# Patient Record
Sex: Female | Born: 1960
Health system: Southern US, Community
[De-identification: ages and names within clinical notes are randomized; demographics above are authoritative.]

## PROBLEM LIST (undated history)

## (undated) DIAGNOSIS — I10 Essential (primary) hypertension: Secondary | ICD-10-CM

## (undated) DIAGNOSIS — E785 Hyperlipidemia, unspecified: Secondary | ICD-10-CM

## (undated) DIAGNOSIS — E039 Hypothyroidism, unspecified: Secondary | ICD-10-CM

## (undated) DIAGNOSIS — F99 Mental disorder, not otherwise specified: Secondary | ICD-10-CM

## (undated) DIAGNOSIS — M1990 Unspecified osteoarthritis, unspecified site: Secondary | ICD-10-CM

## (undated) DIAGNOSIS — Z96659 Presence of unspecified artificial knee joint: Secondary | ICD-10-CM

## (undated) DIAGNOSIS — E079 Disorder of thyroid, unspecified: Secondary | ICD-10-CM

## (undated) DIAGNOSIS — F32A Depression, unspecified: Secondary | ICD-10-CM

## (undated) DIAGNOSIS — E119 Type 2 diabetes mellitus without complications: Secondary | ICD-10-CM

## (undated) DIAGNOSIS — F329 Major depressive disorder, single episode, unspecified: Secondary | ICD-10-CM

## (undated) DIAGNOSIS — Z972 Presence of dental prosthetic device (complete) (partial): Secondary | ICD-10-CM

## (undated) DIAGNOSIS — F419 Anxiety disorder, unspecified: Secondary | ICD-10-CM

## (undated) HISTORY — PX: MULTIPLE TOOTH EXTRACTIONS: SHX2053

## (undated) HISTORY — DX: Essential (primary) hypertension: I10

## (undated) HISTORY — PX: JOINT REPLACEMENT: SHX530

## (undated) HISTORY — PX: HAND SURGERY: SHX662

## (undated) HISTORY — DX: Unspecified osteoarthritis, unspecified site: M19.90

## (undated) HISTORY — PX: CARPAL TUNNEL RELEASE: SHX101

## (undated) HISTORY — DX: Disorder of thyroid, unspecified: E07.9

## (undated) HISTORY — PX: TUBAL LIGATION: SHX77

## (undated) HISTORY — PX: BACK SURGERY: SHX140

## (undated) HISTORY — PX: TONSILLECTOMY: SUR1361

---

## 1998-06-27 ENCOUNTER — Ambulatory Visit (HOSPITAL_COMMUNITY): Admission: RE | Admit: 1998-06-27 | Discharge: 1998-06-27 | Payer: Self-pay | Admitting: Orthopedic Surgery

## 1998-06-27 ENCOUNTER — Encounter: Payer: Self-pay | Admitting: Orthopedic Surgery

## 1998-07-11 ENCOUNTER — Ambulatory Visit (HOSPITAL_COMMUNITY): Admission: RE | Admit: 1998-07-11 | Discharge: 1998-07-11 | Payer: Self-pay | Admitting: Orthopedic Surgery

## 1998-07-11 ENCOUNTER — Encounter: Payer: Self-pay | Admitting: Orthopedic Surgery

## 1998-07-25 ENCOUNTER — Ambulatory Visit (HOSPITAL_COMMUNITY): Admission: RE | Admit: 1998-07-25 | Discharge: 1998-07-25 | Payer: Self-pay | Admitting: Orthopedic Surgery

## 1999-02-21 ENCOUNTER — Ambulatory Visit (HOSPITAL_COMMUNITY): Admission: RE | Admit: 1999-02-21 | Discharge: 1999-02-21 | Payer: Self-pay | Admitting: Internal Medicine

## 1999-02-21 ENCOUNTER — Encounter: Payer: Self-pay | Admitting: Internal Medicine

## 1999-02-26 ENCOUNTER — Ambulatory Visit (HOSPITAL_COMMUNITY): Admission: RE | Admit: 1999-02-26 | Discharge: 1999-02-26 | Payer: Self-pay | Admitting: Internal Medicine

## 1999-02-26 ENCOUNTER — Encounter: Payer: Self-pay | Admitting: Internal Medicine

## 1999-03-28 ENCOUNTER — Encounter: Payer: Self-pay | Admitting: Orthopedic Surgery

## 1999-04-03 ENCOUNTER — Inpatient Hospital Stay (HOSPITAL_COMMUNITY): Admission: RE | Admit: 1999-04-03 | Discharge: 1999-04-07 | Payer: Self-pay | Admitting: Orthopedic Surgery

## 1999-04-03 ENCOUNTER — Encounter: Payer: Self-pay | Admitting: Orthopedic Surgery

## 2000-02-13 ENCOUNTER — Other Ambulatory Visit: Admission: RE | Admit: 2000-02-13 | Discharge: 2000-02-13 | Payer: Self-pay | Admitting: Internal Medicine

## 2001-09-28 ENCOUNTER — Encounter: Payer: Self-pay | Admitting: Orthopedic Surgery

## 2001-10-03 ENCOUNTER — Encounter: Payer: Self-pay | Admitting: Orthopedic Surgery

## 2001-10-03 ENCOUNTER — Inpatient Hospital Stay (HOSPITAL_COMMUNITY): Admission: RE | Admit: 2001-10-03 | Discharge: 2001-10-07 | Payer: Self-pay | Admitting: Orthopedic Surgery

## 2006-02-17 ENCOUNTER — Encounter: Payer: Self-pay | Admitting: Orthopedic Surgery

## 2007-01-18 ENCOUNTER — Encounter: Admission: RE | Admit: 2007-01-18 | Discharge: 2007-01-18 | Payer: Self-pay | Admitting: Orthopedic Surgery

## 2008-08-22 ENCOUNTER — Encounter (INDEPENDENT_AMBULATORY_CARE_PROVIDER_SITE_OTHER): Payer: Self-pay | Admitting: Otolaryngology

## 2008-08-22 ENCOUNTER — Ambulatory Visit (HOSPITAL_COMMUNITY): Admission: RE | Admit: 2008-08-22 | Discharge: 2008-08-23 | Payer: Self-pay | Admitting: Otolaryngology

## 2010-11-18 ENCOUNTER — Other Ambulatory Visit (HOSPITAL_COMMUNITY): Payer: Self-pay | Admitting: Orthopedic Surgery

## 2010-11-18 DIAGNOSIS — T84038A Mechanical loosening of other internal prosthetic joint, initial encounter: Secondary | ICD-10-CM

## 2010-11-28 ENCOUNTER — Other Ambulatory Visit (HOSPITAL_COMMUNITY): Payer: Self-pay | Admitting: Orthopedic Surgery

## 2010-11-28 ENCOUNTER — Encounter (HOSPITAL_COMMUNITY): Payer: Self-pay

## 2010-11-28 ENCOUNTER — Encounter (HOSPITAL_COMMUNITY)
Admission: RE | Admit: 2010-11-28 | Discharge: 2010-11-28 | Disposition: A | Discharge status: Home / Self Care | Payer: Medicare Other | Source: Ambulatory Visit | Attending: Orthopedic Surgery | Admitting: Orthopedic Surgery

## 2010-11-28 DIAGNOSIS — T84038A Mechanical loosening of other internal prosthetic joint, initial encounter: Secondary | ICD-10-CM

## 2010-11-28 DIAGNOSIS — R937 Abnormal findings on diagnostic imaging of other parts of musculoskeletal system: Secondary | ICD-10-CM | POA: Insufficient documentation

## 2010-11-28 DIAGNOSIS — Z96659 Presence of unspecified artificial knee joint: Secondary | ICD-10-CM | POA: Insufficient documentation

## 2010-11-28 DIAGNOSIS — W19XXXA Unspecified fall, initial encounter: Secondary | ICD-10-CM | POA: Insufficient documentation

## 2010-11-28 DIAGNOSIS — M25569 Pain in unspecified knee: Secondary | ICD-10-CM | POA: Insufficient documentation

## 2010-11-28 HISTORY — DX: Presence of unspecified artificial knee joint: Z96.659

## 2010-11-28 MED ORDER — TECHNETIUM TC 99M MEDRONATE IV KIT
23.1000 | PACK | Freq: Once | INTRAVENOUS | Status: AC | PRN
  Administered 2010-11-28: 23.1 via INTRAVENOUS

## 2011-03-06 NOTE — Procedures (Signed)
[place]. [facility]  Patient:    Margaret [name], Margaret [name] Visit Number: [id number] MRN: [id number]          Service Type: SUR Location: 5000 5028 01 Attending Physician:  Colbert Ewing Dictated by:   Kaylyn Layer Michelle Piper, M.D. Proc. Date: 10/03/01 Admit Date:  10/03/2001                             Procedure Report  PROCEDURE PERFORMED:  Epidural catheter placement for postoperative pain relief.  ANESTHESIOLOGIST:  Kaylyn Layer. Michelle Piper, M.D.  DESCRIPTION OF PROCEDURE:  I was consulted by Dr. Richardson Landry to place an epidural catheter into Ms. Petsch for postoperative pain relief.  I had the opportunity to talk to the patient preoperatively and explain the risks and benefits and she agreed to the catheter placement.  At the end of the procedure prior to emergency from anesthesia, the patient was turned to the right lateral decubitus position.  Her back was prepped with Betadine.  Using a #17 Tuohy needle the epidural space was easily cannulated at the L2-3 interspace using the loss of resistance technique.  The catheter was threaded 5 cm into the epidural space and the needle removed.  The catheter was then affixed to the patient back.  She was then turned supine. 100 mcg of fentanyl and 10 cc of 0.5% Xylocaine was incrementally injected after negative aspiration of the catheter.  She was then extubated and taken to the recovery room in stable condition.  In the post anesthesia care unit a fentanyl/Marcaine infusion was begun.  She was followed on the floor by Anesthesia. Dictated by:   Kaylyn Layer Michelle Piper, M.D. Attending Physician:  Colbert Ewing DD:  10/03/01 TD:  10/03/01 Job: [id number]

## 2011-03-06 NOTE — Discharge Summary (Signed)
[place]. [facility]  Patient:    Margaret [name], Margaret [name] Visit Number: [id number] MRN: [id number]          Service Type: SUR Location: 5000 5028 01 Attending Physician:  Colbert Ewing Dictated by:   Oris Drone Petrarca, P.A.-C. Admit Date:  10/03/2001 Discharge Date: 10/07/2001                             Discharge Summary  ADMISSION DIAGNOSIS:  Advanced degenerative joint disease left knee.  DISCHARGE DIAGNOSES: 1. Advanced degenerative joint disease left knee. 2. Cigarette smoker. 3. Exogenous obesity. 4. Hypothyroidism.  PROCEDURE:  Left total knee replacement.  HISTORY OF PRESENT ILLNESS:  The patient is a 50 year old white female with long-standing pain and swelling of her right knee.  She has arthroscopically documented grade 3 to 4 changes.  She has failed conservative treatment.  Now indicated for right total knee replacement.  HOSPITAL COURSE:  The patient is a 50 year old white female admitted October 03, 2001 after appropriate laboratory studies were obtained as well as a 1 g of Ancef IV.  The patient was taken to the operating room where underwent a right total knee replacement.  She tolerated the procedure well. An epidural was placed for postoperative pain management.  Heparin 5000 units subcutaneous q.12h. was placed until her Coumadin became therapeutic. Consultation for PT and OT were made.  Ambulating and weightbearing as tolerated on the right.  A Foley was placed intraoperatively.  She did have some difficulties with her CPM postoperatively, but this was managed by the ortho tech.  There as also difficulty with line placement, and unfortunately at the time, she had her epidural accidentally disconnected.  She was placed on Mepergan Fortes and Dilaudid for pain.  She actually otherwise had an unremarkable hospital course, and she was discharged on October 07, 2001 to return back to our office in ten days for staple  removal.  DIAGNOSTIC/LABORATORY STUDIES:  EKG showed normal sinus rhythm.  Chest x-ray showed no abnormality.  Right knee on October 03, 2001 reveals satisfactory right total knee replacement.  Laboratory studies of September 28, 2001 reveals a hemoglobin of 14.2, hematocrit 41.6%, white count 12,300, and platelets 338,000.  On October 06, 2001, hemoglobin 12.4, hematocrit 36.4%, white count 17,500, and platelets 286,000.  Chemistries of September 28, 2001, sodium 138, potassium 4.3, chloride 103, CO2 26, glucose 104, BUN 6, creatinine 0.6, calcium 9.5, total protein 7.0, albumin 3.9, AST 19, ALT 20, ALP 80, and total bilirubin 0.7.  Discharge sodium 131, potassium 3.3, chloride 95, CO2 25, glucose 133, BUN 5, creatinine 0.6, and calcium 8.4. Urinalysis showed a large amount of hemoglobin, few epithelials, 0-2 white cells, 7-10 red cells, and many bacteria.  Blood type was A positive and antibody screen negative.  DISCHARGE MEDICATIONS: 1. The patient was given a prescription for Coumadin 5 mg taken two tablets on    the day of discharge and starting then 1-1/2 tablets daily as per [facility]    Pharmacy. 2. Mepergan fortes 1-2 tablets q.4h. p.r.n. pain.  DISCHARGE ACTIVITY:  Weightbearing as tolerated.  DISCHARGE DIET:  No restrictions on her diet.  INSTRUCTIONS TO PATIENT:  Keep her wound clean and dry.  Call if she has any problems with increased temperature, infection, or drainage.  She will follow back up in the office in ten days for follow-up.  CONDITION ON DISCHARGE:  Improved. Dictated by:   Oris Drone Petrarca, P.A.-C.  Attending Physician:  Colbert Ewing DD:  10/24/01 TD:  10/24/01 Job: [id number]

## 2011-03-06 NOTE — Op Note (Signed)
[name], [name]              ACCOUNT NO.:  [id number]   MEDICAL RECORD NO.:  [id number]          PATIENT TYPE:  OIB   LOCATION:  3301                         FACILITY:  [facility]   PHYSICIAN:  Kristine Garbe. Ezzard Standing, M.D.DATE OF BIRTH:  1961/02/02   DATE OF PROCEDURE:  07/22/2008  DATE OF DISCHARGE:                               OPERATIVE REPORT   PREOPERATIVE DIAGNOSES:  1. Chronic tonsillar hypertrophy.  2. History recurrent tonsillitis.  3. History of obstructive sleep apnea.   POSTOPERATIVE DIAGNOSES:  1. Chronic tonsillar hypertrophy.  2. History recurrent tonsillitis.  3. History of obstructive sleep apnea.   OPERATION:  Tonsillectomy.   SURGEON:  Kristine Garbe. Ezzard Standing, MD   ANESTHESIA:  General endotracheal.   COMPLICATIONS:  None.   BRIEF CLINICAL NOTE:  Margaret [name] is a 50 year old female who has had  history of chronic tonsil problems with large tonsils.  She has had  frequent sore throats.  She also has history of obstructive sleep apnea.  She is taken to the operating room at this time for tonsillectomy.   DESCRIPTION OF PROCEDURE:  After adequate endotracheal anesthesia, the  patient received 10 mg of Decadron IV preoperatively as well as 1 g  Ancef IV preoperatively.  A mouth gag was used to expose the oropharynx.  The left right tonsils were then recessed from tonsillar fossa using a  cautery.  Care was taken to preserve the anterior and posterior  tonsillar pillars as well as uvula.  Hemostasis was obtained with a  cautery.  After obtaining adequate hemostasis, the oropharynx was  irrigated with saline.  This completed the procedure.  Margaret [name] was awoke  from anesthesia and transferred to the recovery room postoperative, and  doing well.   DISPOSITION:  Margaret [name] will be observed overnight because of history of  obstructive sleep apnea.  We will plan on discharge home in the morning  on amoxicillin suspension 500 mg b.i.d. for one week, Tylenol, Lortab  Elixir 1 to 1-1/2 tablespoons q.4 h. p.r.n. pain.  I will have her  follow up in my office in 2 weeks for recheck.           ______________________________  Kristine Garbe. Ezzard Standing, M.D.     CEN/MEDQ  D:  08/22/2008  T:  08/23/2008  Job:  [id number]   cc:   Fenton Malling, MD

## 2011-03-06 NOTE — Op Note (Signed)
[place]. [facility]  Patient:    Margaret [name], Margaret [name] Visit Number: [id number] MRN: [id number]          Service Type: SUR Location: 5000 5028 01 Attending Physician:  Colbert Ewing Dictated by:   Loreta Ave, M.D. Proc. Date: 10/03/01 Admit Date:  10/03/2001                             Operative Report  PREOPERATIVE DIAGNOSIS:  End-stage degenerative arthritis, right knee.  POSTOPERATIVE DIAGNOSIS:  End-stage degenerative arthritis, right knee, with varus alignment and mild flexion contracture.  PROCEDURE:  Right total knee replacement with appropriate soft tissue balancing.  Noncemented cruciate-retaining #7 femoral component.  Cemented #7 tibial component with 10 mm polyethylene insert.  Cemented, nonmetal-backed 26 mm patellar component.  SURGEON:  Loreta Ave, M.D.  ASSISTANT:  Arlys John D. Petrarca, P.A.-C.  ANESTHESIA:  General.  ESTIMATED BLOOD LOSS:  Minimal.  TOURNIQUET TIME:  1 hour 10 minutes.  SPECIMENS:  Excised bone and soft tissue.  CULTURES:  None.  COMPLICATIONS:  None.  DRESSING:  Soft compressive with knee immobilizer.  DRAINS:  Hemovac x 2.  DESCRIPTION OF PROCEDURE:  Patient brought to the operating room and placed on the operating table in supine position.  After adequate anesthesia had been obtained, right knee examined.  A 5 degree flexion contracture, further flexion to 100 degrees, limited by adiposity on the back of her calf and thigh.  Tourniquet applied, prepped and draped in the usual sterile fashion. Ligaments were stable.  Alignment in slight varus, correctable to neutral. After exsanguination with elevation and Esmarch, tourniquet inflated to 350 mmHg.  A straight incision above the patella down to the tibial tubercle. Skin and subcutaneous tissue divided, hemostasis obtained with electrocautery. Medial parapatellar arthrotomy exposing the knee.  Remnants of menisci, anterior cruciate  ligament, periarticular spur, subtrochanteric synovitis, and fibrotic tissue were removed.  Grade 4 changes of the patellofemoral and medial compartment.  Distal femur exposed.  The intramedullary guide placed. A 12 mm cut set in 5 degrees of valgus.  Sized to a #7 component.  Jig was put in place and definitive cuts made.  Trial put in place, found to fit well with a cruciate-retaining #7 component.  Trial removed.  Tibia exposed.  Tibial spine was removed with a saw.  Sized to a #7 component.  The intramedullary guide placed.  Proximal cut with a 5 degree posterior slope angle with the 6 mm guide used to set the height.  Once this was complete, trials put in place.  A #7 component on the tibia and femur.  With the 10 mm insert, I had full extension, full flexion, and a nicely balanced knee with release of the medial capsule.  The patella was then sized, reamed, and drilled to a 26 mm component.  The trial was placed.  I had good tracking and lateral release not necessary.  The tibia was marked for appropriate rotation and then hand-reamed for the cruciate-retaining portion of the component.  All trials removed.  The knee thoroughly irrigated.  Cement prepared, placed on the tibial and femoral components, which were both well-seated with excessive cement removed. Polyethylene attached.  Femoral component seated.  Knee reduced.  Once the cement had hardened, the knee was re-examined with good extension, good flexion, good stability, set at 5 degrees of valgus.  Good patellofemoral tracking.   A Hemovac was placed, brought out through  a separate stab wound. Arthrotomy closed with #1 Vicryl, skin and subcutaneous tissue with Vicryl and staples.  Margins of the wound and the knee injected with Marcaine and Hemovacs were clamped.  Sterile compressive dressing applied.  Tourniquet deflated and removed.  The knee immobilizer applied.  Anesthesia reversed. Brought to the recovery room.  Tolerated  the surgery well with no complications. Dictated by:   Loreta Ave, M.D. Attending Physician:  Colbert Ewing DD:  10/03/01 TD:  10/03/01 Job: [id number]

## 2011-03-13 ENCOUNTER — Other Ambulatory Visit (HOSPITAL_COMMUNITY): Payer: Self-pay | Admitting: Orthopedic Surgery

## 2011-03-13 ENCOUNTER — Other Ambulatory Visit: Payer: Self-pay | Admitting: Orthopedic Surgery

## 2011-03-13 ENCOUNTER — Encounter (HOSPITAL_COMMUNITY): Payer: Medicare Other

## 2011-03-13 ENCOUNTER — Ambulatory Visit (HOSPITAL_COMMUNITY)
Admission: RE | Admit: 2011-03-13 | Discharge: 2011-03-13 | Disposition: A | Discharge status: Home / Self Care | Payer: Medicare Other | Source: Ambulatory Visit | Attending: Orthopedic Surgery | Admitting: Orthopedic Surgery

## 2011-03-13 DIAGNOSIS — Z01812 Encounter for preprocedural laboratory examination: Secondary | ICD-10-CM | POA: Insufficient documentation

## 2011-03-13 DIAGNOSIS — T84093A Other mechanical complication of internal left knee prosthesis, initial encounter: Secondary | ICD-10-CM

## 2011-03-13 DIAGNOSIS — Z01818 Encounter for other preprocedural examination: Secondary | ICD-10-CM | POA: Insufficient documentation

## 2011-03-13 DIAGNOSIS — Z01811 Encounter for preprocedural respiratory examination: Secondary | ICD-10-CM | POA: Insufficient documentation

## 2011-03-13 LAB — URINALYSIS, ROUTINE W REFLEX MICROSCOPIC
Bilirubin Urine: NEGATIVE
Glucose, UA: NEGATIVE mg/dL
Hgb urine dipstick: NEGATIVE
Ketones, ur: NEGATIVE mg/dL
Nitrite: NEGATIVE
Protein, ur: NEGATIVE mg/dL
Specific Gravity, Urine: 1.013 (ref 1.005–1.030)
Urobilinogen, UA: 0.2 mg/dL (ref 0.0–1.0)
pH: 7 (ref 5.0–8.0)

## 2011-03-13 LAB — COMPREHENSIVE METABOLIC PANEL
ALT: 16 U/L (ref 0–35)
AST: 14 U/L (ref 0–37)
Albumin: 3.9 g/dL (ref 3.5–5.2)
Alkaline Phosphatase: 68 U/L (ref 39–117)
BUN: 13 mg/dL (ref 6–23)
CO2: 31 mEq/L (ref 19–32)
Calcium: 9.4 mg/dL (ref 8.4–10.5)
Chloride: 100 mEq/L (ref 96–112)
Creatinine, Ser: <0.47 mg/dL (ref 0.4–1.2)
Glucose, Bld: 100 mg/dL — ABNORMAL HIGH (ref 70–99)
Potassium: 4.3 mEq/L (ref 3.5–5.1)
Sodium: 139 mEq/L (ref 135–145)
Total Bilirubin: 0.3 mg/dL (ref 0.3–1.2)
Total Protein: 7.2 g/dL (ref 6.0–8.3)

## 2011-03-13 LAB — DIFFERENTIAL
Basophils Absolute: 0.1 10*3/uL (ref 0.0–0.1)
Basophils Relative: 1 % (ref 0–1)
Eosinophils Absolute: 0.1 10*3/uL (ref 0.0–0.7)
Eosinophils Relative: 1 % (ref 0–5)
Lymphocytes Relative: 30 % (ref 12–46)
Lymphs Abs: 2.8 10*3/uL (ref 0.7–4.0)
Monocytes Absolute: 0.6 10*3/uL (ref 0.1–1.0)
Monocytes Relative: 7 % (ref 3–12)
Neutro Abs: 5.9 10*3/uL (ref 1.7–7.7)
Neutrophils Relative %: 62 % (ref 43–77)

## 2011-03-13 LAB — CBC
HCT: 42 % (ref 36.0–46.0)
Hemoglobin: 14.1 g/dL (ref 12.0–15.0)
MCH: 28.6 pg (ref 26.0–34.0)
MCHC: 33.6 g/dL (ref 30.0–36.0)
MCV: 85.2 fL (ref 78.0–100.0)
Platelets: 245 10*3/uL (ref 150–400)
RBC: 4.93 MIL/uL (ref 3.87–5.11)
RDW: 12.5 % (ref 11.5–15.5)
WBC: 9.5 10*3/uL (ref 4.0–10.5)

## 2011-03-13 LAB — PROTIME-INR
INR: 1.03 (ref 0.00–1.49)
Prothrombin Time: 13.7 seconds (ref 11.6–15.2)

## 2011-03-13 LAB — APTT: aPTT: 33 seconds (ref 24–37)

## 2011-03-13 LAB — SURGICAL PCR SCREEN: MRSA, PCR: NEGATIVE

## 2011-03-13 LAB — HCG, SERUM, QUALITATIVE: Preg, Serum: NEGATIVE

## 2011-03-14 LAB — URINE CULTURE
Colony Count: NO GROWTH
Culture  Setup Time: 201205251822
Culture: NO GROWTH
Special Requests: NEGATIVE

## 2011-03-14 LAB — ABO/RH: ABO/RH(D): A POS

## 2011-03-19 ENCOUNTER — Inpatient Hospital Stay (HOSPITAL_COMMUNITY): Admission: RE | Admit: 2011-03-19 | Payer: Medicare Other | Source: Ambulatory Visit | Admitting: Orthopedic Surgery

## 2011-03-19 ENCOUNTER — Inpatient Hospital Stay (HOSPITAL_COMMUNITY)
Admission: RE | Admit: 2011-03-19 | Discharge: 2011-03-21 | DRG: 467 | Disposition: A | Discharge status: Home Health | Payer: Medicare Other | Source: Ambulatory Visit | Attending: Orthopedic Surgery | Admitting: Orthopedic Surgery

## 2011-03-19 DIAGNOSIS — M1710 Unilateral primary osteoarthritis, unspecified knee: Secondary | ICD-10-CM | POA: Diagnosis present

## 2011-03-19 DIAGNOSIS — E039 Hypothyroidism, unspecified: Secondary | ICD-10-CM | POA: Diagnosis present

## 2011-03-19 DIAGNOSIS — Z01812 Encounter for preprocedural laboratory examination: Secondary | ICD-10-CM

## 2011-03-19 DIAGNOSIS — Z6841 Body Mass Index (BMI) 40.0 and over, adult: Secondary | ICD-10-CM

## 2011-03-19 DIAGNOSIS — Y831 Surgical operation with implant of artificial internal device as the cause of abnormal reaction of the patient, or of later complication, without mention of misadventure at the time of the procedure: Secondary | ICD-10-CM | POA: Diagnosis present

## 2011-03-19 DIAGNOSIS — T84039A Mechanical loosening of unspecified internal prosthetic joint, initial encounter: Principal | ICD-10-CM | POA: Diagnosis present

## 2011-03-19 DIAGNOSIS — E669 Obesity, unspecified: Secondary | ICD-10-CM | POA: Diagnosis present

## 2011-03-19 DIAGNOSIS — D62 Acute posthemorrhagic anemia: Secondary | ICD-10-CM | POA: Diagnosis not present

## 2011-03-19 DIAGNOSIS — Z96659 Presence of unspecified artificial knee joint: Secondary | ICD-10-CM

## 2011-03-19 DIAGNOSIS — I10 Essential (primary) hypertension: Secondary | ICD-10-CM | POA: Diagnosis present

## 2011-03-20 LAB — BASIC METABOLIC PANEL
BUN: 14 mg/dL (ref 6–23)
CO2: 29 mEq/L (ref 19–32)
Calcium: 8.5 mg/dL (ref 8.4–10.5)
Chloride: 102 mEq/L (ref 96–112)
Creatinine, Ser: <0.47 mg/dL (ref 0.4–1.2)
Glucose, Bld: 135 mg/dL — ABNORMAL HIGH (ref 70–99)
Potassium: 4.7 mEq/L (ref 3.5–5.1)
Sodium: 138 mEq/L (ref 135–145)

## 2011-03-20 LAB — CBC
HCT: 33.7 % — ABNORMAL LOW (ref 36.0–46.0)
Hemoglobin: 11.2 g/dL — ABNORMAL LOW (ref 12.0–15.0)
MCH: 28.4 pg (ref 26.0–34.0)
MCHC: 33.2 g/dL (ref 30.0–36.0)
MCV: 85.5 fL (ref 78.0–100.0)
Platelets: 248 10*3/uL (ref 150–400)
RBC: 3.94 MIL/uL (ref 3.87–5.11)
RDW: 12.4 % (ref 11.5–15.5)
WBC: 12.8 10*3/uL — ABNORMAL HIGH (ref 4.0–10.5)

## 2011-03-21 LAB — CBC
HCT: 32.4 % — ABNORMAL LOW (ref 36.0–46.0)
Hemoglobin: 10.9 g/dL — ABNORMAL LOW (ref 12.0–15.0)
MCH: 29 pg (ref 26.0–34.0)
MCHC: 33.6 g/dL (ref 30.0–36.0)
MCV: 86.2 fL (ref 78.0–100.0)
Platelets: 233 10*3/uL (ref 150–400)
RBC: 3.76 MIL/uL — ABNORMAL LOW (ref 3.87–5.11)
RDW: 12.8 % (ref 11.5–15.5)
WBC: 12.3 10*3/uL — ABNORMAL HIGH (ref 4.0–10.5)

## 2011-03-21 LAB — BASIC METABOLIC PANEL
BUN: 9 mg/dL (ref 6–23)
CO2: 31 mEq/L (ref 19–32)
Calcium: 8.3 mg/dL — ABNORMAL LOW (ref 8.4–10.5)
Chloride: 100 mEq/L (ref 96–112)
Creatinine, Ser: <0.47 mg/dL (ref 0.4–1.2)
Glucose, Bld: 110 mg/dL — ABNORMAL HIGH (ref 70–99)
Potassium: 3.4 mEq/L — ABNORMAL LOW (ref 3.5–5.1)
Sodium: 136 mEq/L (ref 135–145)

## 2011-03-23 LAB — CROSSMATCH
ABO/RH(D): A POS
Antibody Screen: NEGATIVE
Unit division: 0
Unit division: 0

## 2011-03-27 ENCOUNTER — Other Ambulatory Visit: Payer: Self-pay | Admitting: Orthopaedic Surgery

## 2011-03-27 DIAGNOSIS — R52 Pain, unspecified: Secondary | ICD-10-CM

## 2011-03-30 ENCOUNTER — Ambulatory Visit
Admission: RE | Admit: 2011-03-30 | Discharge: 2011-03-30 | Disposition: A | Discharge status: Home / Self Care | Payer: Medicare Other | Source: Ambulatory Visit | Attending: Orthopaedic Surgery | Admitting: Orthopaedic Surgery

## 2011-03-30 DIAGNOSIS — R52 Pain, unspecified: Secondary | ICD-10-CM

## 2011-04-01 NOTE — Op Note (Signed)
NAMESALIMATA, CHRISTENSON              ACCOUNT NO.:  [id number]  MEDICAL RECORD NO.:  [id number]           PATIENT TYPE:  I  LOCATION:  1607                         FACILITY:  [facility]  PHYSICIAN:  Graycen Degan L. Rendall, M.D.  DATE OF BIRTH:  1961/04/11  DATE OF PROCEDURE:  03/19/2011 DATE OF DISCHARGE:                              OPERATIVE REPORT   PREOPERATIVE DIAGNOSIS:  Failed unicompartmental prosthesis, left total knee.  SURGICAL PROCEDURES:  Removal of unicompartmental prosthesis and insertion full LCS total knee arthroplasty with computer navigation assistance.  POSTOPERATIVE DIAGNOSIS:  Failed unicompartmental prosthesis, left total knee.  SURGEON:  Evette Diclemente L. Rendall, M.D.  ASSISTANT:  Legrand Pitts. Duffy, P.A-C.  ANESTHESIA:  General with femoral nerve block.  PATHOLOGY:  The patient has a unicompartmental total knee that was done in 2000.  Over the last year, it has failed and radiographically there is loosening under the tibial tray.  She is brought in for total knee arthroplasty revision at this time using computer navigation.  PROCEDURE:  Under general anesthetic with femoral nerve block, left leg was prepared with DuraPrep and draped as a sterile field.  Sterile tourniquet was applied proximally.  The leg was wrapped out with the Esmarch and the tourniquet used at 350 mmHg.  The previous midline incision was reopened, the scar was revised.  Dissection was carried deep to the medial parapatellar incision where the knee was opened.  The patella was everted.  The femur is sized at a standard plus roughly and at this point the dissection is done around the unicompartmental knee medially and the tibial tray was grossly loose and this was removed from the knee.  The femoral component was undercut with osteotomes and short oscillating saw and then removed easily.  At this point, further debridement was done in preparation for computer mapping.  Two Schanz pins were then placed in  the superomedial tibia through punctures and in the distal femur.  The arrays were set up.  The proximal tibia and distal femur are mapped and at this point once the mapping was completed the proximal tibia was resected 10 mm from the high side ignoring the defect from the previous component.  There was enough bone at the level of the joint line so that there was only a small depression caused by the previous component.  The balancer was then inserted.  The knee ligaments were balanced and flexion gap was measured.  Using the second guide, the anterior and posterior flare of the distal femur resected and the distal femoral cut was then made.  Flexion and extension gaps were balanced at approximately 10 mm.  Lamina spreader was inserted. Remnants of the menisci and debris in the back medially removed.  Spurs were taken off the back of the femoral condyle.  Attention was then turned to the recessing guide which was used uneventfully for the standard plus component.  Attention was then turned to the tibia. McHale was placed posteriorly, a Hohmann laterally.  The proximal tibia was further checked.  Sclerotic bone in the back medially was buzzed and then drill holes were placed in it.  There was  no cement remaining. Once this was completed, the tibia was sized at a #3 and MBT revision tray was inserted as trial after appropriate reaming.  A #10 bearing was inserted, the standard plus femur.  There was excellent fit, alignment, stability within 1 degree of anatomic alignment and full extension without hyperextension.  The patella was then osteotomized and 3 peg holes made.  With alignment excellent and flexion beyond the 105 degrees, permanent components were then obtained.  All bony surfaces were prepared by pulse irrigation.  All components were cemented in place using antibiotic cement.  The gentamicin SmartSet GHV.  Once the cement had hardened, the tourniquet was let down at 89  minutes. Multiple small vessels were cauterized and excess cement was removed. The knee was then closed in layers with #1 Tycron, #1 Vicryl, 2-0 Vicryl, and skin clips.  The patient returned to recovery in excellent condition.  Operative time approximately 1 hour and 45 minutes.     Yadriel Kerrigan L. Priscille Kluver, M.D.     Renato Gails  D:  03/19/2011  T:  03/19/2011  Job:  [id number]  Electronically Signed by Erasmo Leventhal M.D. on 04/01/2011 11:22:11 AM

## 2011-04-13 ENCOUNTER — Ambulatory Visit: Payer: Medicare Other | Attending: Orthopedic Surgery | Admitting: Physical Therapy

## 2011-04-13 DIAGNOSIS — M25669 Stiffness of unspecified knee, not elsewhere classified: Secondary | ICD-10-CM | POA: Insufficient documentation

## 2011-04-13 DIAGNOSIS — M25569 Pain in unspecified knee: Secondary | ICD-10-CM | POA: Insufficient documentation

## 2011-04-13 DIAGNOSIS — Z96659 Presence of unspecified artificial knee joint: Secondary | ICD-10-CM | POA: Insufficient documentation

## 2011-04-13 DIAGNOSIS — IMO0001 Reserved for inherently not codable concepts without codable children: Secondary | ICD-10-CM | POA: Insufficient documentation

## 2011-04-17 ENCOUNTER — Ambulatory Visit: Payer: Medicare Other | Admitting: Physical Therapy

## 2011-04-20 ENCOUNTER — Ambulatory Visit: Payer: Medicare Other | Admitting: Rehabilitative and Restorative Service Providers"

## 2011-04-20 ENCOUNTER — Ambulatory Visit: Payer: Medicare Other | Attending: Orthopedic Surgery | Admitting: Physical Therapy

## 2011-04-20 DIAGNOSIS — M25669 Stiffness of unspecified knee, not elsewhere classified: Secondary | ICD-10-CM | POA: Insufficient documentation

## 2011-04-20 DIAGNOSIS — M25569 Pain in unspecified knee: Secondary | ICD-10-CM | POA: Insufficient documentation

## 2011-04-20 DIAGNOSIS — IMO0001 Reserved for inherently not codable concepts without codable children: Secondary | ICD-10-CM | POA: Insufficient documentation

## 2011-04-20 DIAGNOSIS — Z96659 Presence of unspecified artificial knee joint: Secondary | ICD-10-CM | POA: Insufficient documentation

## 2011-04-23 ENCOUNTER — Ambulatory Visit: Payer: Medicare Other | Admitting: Physical Therapy

## 2011-04-28 ENCOUNTER — Ambulatory Visit: Payer: Medicare Other | Admitting: Physical Therapy

## 2011-04-30 ENCOUNTER — Ambulatory Visit: Payer: Medicare Other | Admitting: Physical Therapy

## 2011-05-05 ENCOUNTER — Ambulatory Visit: Payer: Medicare Other | Admitting: Physical Therapy

## 2011-05-06 NOTE — Discharge Summary (Signed)
Margaret [name], Margaret [name]              ACCOUNT NO.:  [id number]  MEDICAL RECORD NO.:  [id number]  LOCATION:  1607                         FACILITY:  [facility]  PHYSICIAN:  Kalesha Irving L. Rendall, M.D.  DATE OF BIRTH:  [birth year]  DATE OF ADMISSION:  03/19/2011 DATE OF DISCHARGE:  03/21/2011                              DISCHARGE SUMMARY   ADMISSION DIAGNOSES: 1. Failed left unicompartmental knee replacement with a history of a     right total knee. 2. Hypertension. 3. Obesity. 4. Hypothyroidism.  DISCHARGE DIAGNOSES: 1. Conversion of a left unicompartmental knee to a left total knee     arthroplasty. 2. Acute blood loss anemia secondary to surgery. 3. Constipation. 4. History of right total knee arthroplasty. 5. Hypertension. 6. Obesity. 7. Hypothyroidism.  SURGICAL PROCEDURES:  On Mar 19, 2011, Margaret [name] underwent removal of a left unicompartmental knee and then conversion to a left total knee arthroplasty with computer navigation by Dr. Jonny Ruiz L.  Rendall assisted by Arnoldo Morale PA-C.  She had a DePuy primary femoral component, cemented size standard plus left, placed with an LCS complete tibial insert rotating platform, 10-mm thickness standard plus.  A tibial tray rotating platform NBT revision size 3 cemented with a metal back patella, cemented size standard plus.  COMPLICATIONS:  None.  CONSULTS:  Physical therapy consult March 20, 2011.  HISTORY OF PRESENT ILLNESS:  This 50 year old white female, patient presented to Dr. Priscille Kluver with a history of a left uni knee by Dr. Eulah Pont in 2000 and a 54-month history of sudden onset of progressive left knee pain.  She has a history of a left knee scope in 1999 but no problems until a fall in December of 2011.  At this point, the left knee pain is a constant dull ache and throb with occasional sharp sensation, diffuse about the knee without radiation.  It increases with prolonged walking and standing and decreases with ice and Ultracet.   The knee catches occasionally and swells and keeps her up at night.  She has failed conservative treatment.  Because of that, she is presenting for left knee replacement.  HOSPITAL COURSE:  Margaret [name] has tolerated the surgical procedure well without immediate postoperative complications.  Postop day #1, she had some nausea the night before that had resolved.  Her Foley was discontinued and she was able to void.  Hemoglobin 11.2, hematocrit 33.7.  She was up out of the bed, already tolerating CPM to 90 degrees. She was switched off her PCA, discontinued her oxygen and started on therapy per protocol.  On postop day #2, she was doing well, afebrile.  Vitals stable. Hemoglobin stable at 10.  She was tolerating therapy well enough and did well enough.  It was felt she could be discharged home and she was discharged home later that day.  DISCHARGE INSTRUCTIONS:  Diet:  She is to resume her regular prehospitalization diet.  MEDICATIONS:  Please see the patient med discharge instruction sheet for complete documentation of her meds.  New meds that we gave her were Celebrex, Robaxin, Percocet and Xarelto.  ACTIVITY:  She can be out of bed, weightbearing as tolerated on the left leg with use of a walker.  No lifting or driving for 6 weeks.  Please see the white total joint discharge sheet for further activity instructions.  WOUND CARE:  Please see the white total joint discharge sheet for further wound care instructions.  FOLLOWUP:  She is to follow up with Dr. Priscille Kluver in his office on Thursday, June 14 and needs to call [phone] for that appointment.  She is arranged with Genevieve Norlander for home health PT.  LABORATORY DATA:  Hemoglobin/hematocrit ranged from 11.2 and 33.7 on June 1 to 10.9 and 32.4 on June 2.  White count went from 12.8 on June 1 to 12.3 on June 2.  Platelets were within normal limits.  Glucose ranged from 135 on June 1 to 110 on the June 2.  Potassium went from 4.7 on  June 1 to 3.4 on June 2.  Calcium went from 8.5 on the June 1 to 8.3 on June 2.  All other laboratory studies were within normal limits.     Legrand Pitts Duffy, P.A.   ______________________________ Carlisle Beers. Priscille Kluver, M.D.    KED/MEDQ  D:  04/01/2011  T:  04/01/2011  Job:  [id number]  Electronically Signed by Arnoldo Morale P.A. on 04/03/2011 01:51:04 PM Electronically Signed by Erasmo Leventhal M.D. on 05/06/2011 09:58:55 AM

## 2011-05-07 ENCOUNTER — Ambulatory Visit: Payer: Medicare Other | Admitting: Physical Therapy

## 2011-05-11 ENCOUNTER — Ambulatory Visit: Payer: Medicare Other | Admitting: Physical Therapy

## 2011-05-14 ENCOUNTER — Ambulatory Visit: Payer: Medicare Other | Admitting: Physical Therapy

## 2011-05-19 ENCOUNTER — Ambulatory Visit: Payer: Medicare Other | Admitting: Physical Therapy

## 2011-05-22 ENCOUNTER — Encounter: Payer: Medicare Other | Admitting: Physical Therapy

## 2011-05-26 ENCOUNTER — Encounter: Payer: Medicare Other | Admitting: Physical Therapy

## 2011-05-28 ENCOUNTER — Encounter: Payer: Medicare Other | Admitting: Physical Therapy

## 2011-06-01 ENCOUNTER — Encounter: Payer: Medicare Other | Admitting: Physical Therapy

## 2011-06-03 ENCOUNTER — Encounter: Payer: Medicare Other | Admitting: Physical Therapy

## 2011-06-08 ENCOUNTER — Ambulatory Visit: Payer: Medicare Other | Attending: Orthopedic Surgery | Admitting: Physical Therapy

## 2011-06-08 DIAGNOSIS — Z96659 Presence of unspecified artificial knee joint: Secondary | ICD-10-CM | POA: Insufficient documentation

## 2011-06-08 DIAGNOSIS — M25669 Stiffness of unspecified knee, not elsewhere classified: Secondary | ICD-10-CM | POA: Insufficient documentation

## 2011-06-08 DIAGNOSIS — IMO0001 Reserved for inherently not codable concepts without codable children: Secondary | ICD-10-CM | POA: Insufficient documentation

## 2011-06-08 DIAGNOSIS — M25569 Pain in unspecified knee: Secondary | ICD-10-CM | POA: Insufficient documentation

## 2011-06-11 ENCOUNTER — Ambulatory Visit: Payer: Medicare Other | Admitting: Physical Therapy

## 2011-06-23 ENCOUNTER — Ambulatory Visit: Payer: Medicare Other | Attending: Orthopedic Surgery | Admitting: Physical Therapy

## 2011-06-23 DIAGNOSIS — Z96659 Presence of unspecified artificial knee joint: Secondary | ICD-10-CM | POA: Insufficient documentation

## 2011-06-23 DIAGNOSIS — IMO0001 Reserved for inherently not codable concepts without codable children: Secondary | ICD-10-CM | POA: Insufficient documentation

## 2011-06-23 DIAGNOSIS — M25569 Pain in unspecified knee: Secondary | ICD-10-CM | POA: Insufficient documentation

## 2011-06-23 DIAGNOSIS — M25669 Stiffness of unspecified knee, not elsewhere classified: Secondary | ICD-10-CM | POA: Insufficient documentation

## 2011-06-24 ENCOUNTER — Ambulatory Visit: Payer: Medicare Other | Admitting: Physical Therapy

## 2011-07-01 ENCOUNTER — Ambulatory Visit: Payer: Medicare Other | Admitting: Physical Therapy

## 2011-07-03 ENCOUNTER — Ambulatory Visit: Payer: Medicare Other | Admitting: Physical Therapy

## 2011-07-07 ENCOUNTER — Ambulatory Visit: Payer: Medicare Other | Admitting: Physical Therapy

## 2011-07-09 ENCOUNTER — Ambulatory Visit: Payer: Medicare Other | Admitting: Physical Therapy

## 2011-07-14 ENCOUNTER — Ambulatory Visit: Payer: Medicare Other | Admitting: Physical Therapy

## 2011-07-16 ENCOUNTER — Ambulatory Visit: Payer: Medicare Other | Admitting: Physical Therapy

## 2011-07-20 LAB — APTT: aPTT: 31

## 2011-07-20 LAB — CBC
HCT: 39.3
Hemoglobin: 13.1
MCHC: 33.3
MCV: 86.1
Platelets: 305
RBC: 4.57
RDW: 13.8
WBC: 10.6 — ABNORMAL HIGH

## 2011-07-20 LAB — PROTIME-INR
INR: 1
Prothrombin Time: 13.1

## 2011-07-23 ENCOUNTER — Ambulatory Visit: Payer: Medicare Other | Attending: Orthopedic Surgery | Admitting: Physical Therapy

## 2011-07-23 DIAGNOSIS — Z96659 Presence of unspecified artificial knee joint: Secondary | ICD-10-CM | POA: Insufficient documentation

## 2011-07-23 DIAGNOSIS — M25569 Pain in unspecified knee: Secondary | ICD-10-CM | POA: Insufficient documentation

## 2011-07-23 DIAGNOSIS — M25669 Stiffness of unspecified knee, not elsewhere classified: Secondary | ICD-10-CM | POA: Insufficient documentation

## 2011-07-23 DIAGNOSIS — IMO0001 Reserved for inherently not codable concepts without codable children: Secondary | ICD-10-CM | POA: Insufficient documentation

## 2011-07-28 ENCOUNTER — Ambulatory Visit: Payer: Medicare Other | Admitting: Physical Therapy

## 2011-08-04 ENCOUNTER — Ambulatory Visit: Payer: Medicare Other | Admitting: Physical Therapy

## 2011-08-11 ENCOUNTER — Ambulatory Visit: Payer: Medicare Other | Admitting: Physical Therapy

## 2011-08-18 ENCOUNTER — Ambulatory Visit: Payer: Medicare Other | Admitting: Physical Therapy

## 2011-08-25 ENCOUNTER — Encounter: Payer: Medicare Other | Admitting: Physical Therapy

## 2012-02-24 ENCOUNTER — Other Ambulatory Visit (HOSPITAL_COMMUNITY): Payer: Self-pay | Admitting: Orthopaedic Surgery

## 2012-02-24 DIAGNOSIS — M25561 Pain in right knee: Secondary | ICD-10-CM

## 2012-03-01 ENCOUNTER — Encounter (HOSPITAL_COMMUNITY)
Admission: RE | Admit: 2012-03-01 | Discharge: 2012-03-01 | Disposition: A | Discharge status: Home / Self Care | Payer: Medicare Other | Source: Ambulatory Visit | Attending: Orthopaedic Surgery | Admitting: Orthopaedic Surgery

## 2012-03-01 DIAGNOSIS — M25561 Pain in right knee: Secondary | ICD-10-CM

## 2012-03-01 DIAGNOSIS — M25569 Pain in unspecified knee: Secondary | ICD-10-CM | POA: Insufficient documentation

## 2012-03-01 DIAGNOSIS — Z96659 Presence of unspecified artificial knee joint: Secondary | ICD-10-CM | POA: Insufficient documentation

## 2012-03-01 MED ORDER — TECHNETIUM TC 99M MEDRONATE IV KIT
25.0000 | PACK | Freq: Once | INTRAVENOUS | Status: AC | PRN
  Administered 2012-03-01: 25 via INTRAVENOUS

## 2012-07-13 ENCOUNTER — Ambulatory Visit (INDEPENDENT_AMBULATORY_CARE_PROVIDER_SITE_OTHER): Payer: Medicare Other | Admitting: Internal Medicine

## 2012-07-13 ENCOUNTER — Ambulatory Visit (HOSPITAL_BASED_OUTPATIENT_CLINIC_OR_DEPARTMENT_OTHER): Admission: RE | Admit: 2012-07-13 | Payer: Medicare Other | Source: Ambulatory Visit

## 2012-07-13 ENCOUNTER — Encounter: Payer: Self-pay | Admitting: Internal Medicine

## 2012-07-13 ENCOUNTER — Ambulatory Visit (HOSPITAL_BASED_OUTPATIENT_CLINIC_OR_DEPARTMENT_OTHER)
Admission: RE | Admit: 2012-07-13 | Discharge: 2012-07-13 | Disposition: A | Discharge status: Home / Self Care | Payer: Medicare Other | Source: Ambulatory Visit | Attending: Internal Medicine | Admitting: Internal Medicine

## 2012-07-13 VITALS — BP 126/77 | HR 65 | Temp 97.1°F | Resp 16 | Wt 254.0 lb

## 2012-07-13 DIAGNOSIS — M1990 Unspecified osteoarthritis, unspecified site: Secondary | ICD-10-CM | POA: Insufficient documentation

## 2012-07-13 DIAGNOSIS — E039 Hypothyroidism, unspecified: Secondary | ICD-10-CM | POA: Insufficient documentation

## 2012-07-13 DIAGNOSIS — E785 Hyperlipidemia, unspecified: Secondary | ICD-10-CM

## 2012-07-13 DIAGNOSIS — Z634 Disappearance and death of family member: Secondary | ICD-10-CM | POA: Insufficient documentation

## 2012-07-13 DIAGNOSIS — Z139 Encounter for screening, unspecified: Secondary | ICD-10-CM

## 2012-07-13 DIAGNOSIS — I10 Essential (primary) hypertension: Secondary | ICD-10-CM

## 2012-07-13 DIAGNOSIS — Z780 Asymptomatic menopausal state: Secondary | ICD-10-CM | POA: Insufficient documentation

## 2012-07-13 DIAGNOSIS — Z8669 Personal history of other diseases of the nervous system and sense organs: Secondary | ICD-10-CM | POA: Insufficient documentation

## 2012-07-13 DIAGNOSIS — Z1231 Encounter for screening mammogram for malignant neoplasm of breast: Secondary | ICD-10-CM | POA: Insufficient documentation

## 2012-07-13 LAB — COMPREHENSIVE METABOLIC PANEL
ALT: 11 U/L (ref 0–35)
AST: 11 U/L (ref 0–37)
Albumin: 4.4 g/dL (ref 3.5–5.2)
Alkaline Phosphatase: 55 U/L (ref 39–117)
BUN: 16 mg/dL (ref 6–23)
CO2: 31 mEq/L (ref 19–32)
Calcium: 9.5 mg/dL (ref 8.4–10.5)
Chloride: 99 mEq/L (ref 96–112)
Creat: 0.6 mg/dL (ref 0.50–1.10)
Glucose, Bld: 92 mg/dL (ref 70–99)
Potassium: 4.5 mEq/L (ref 3.5–5.3)
Sodium: 138 mEq/L (ref 135–145)
Total Bilirubin: 0.4 mg/dL (ref 0.3–1.2)
Total Protein: 6.6 g/dL (ref 6.0–8.3)

## 2012-07-13 LAB — CBC WITH DIFFERENTIAL/PLATELET
Basophils Absolute: 0.1 10*3/uL (ref 0.0–0.1)
Basophils Relative: 1 % (ref 0–1)
Eosinophils Absolute: 0.2 10*3/uL (ref 0.0–0.7)
Eosinophils Relative: 2 % (ref 0–5)
HCT: 41.4 % (ref 36.0–46.0)
Hemoglobin: 13.9 g/dL (ref 12.0–15.0)
Lymphocytes Relative: 36 % (ref 12–46)
Lymphs Abs: 3.2 10*3/uL (ref 0.7–4.0)
MCH: 28 pg (ref 26.0–34.0)
MCHC: 33.6 g/dL (ref 30.0–36.0)
MCV: 83.3 fL (ref 78.0–100.0)
Monocytes Absolute: 0.5 10*3/uL (ref 0.1–1.0)
Monocytes Relative: 5 % (ref 3–12)
Neutro Abs: 4.9 10*3/uL (ref 1.7–7.7)
Neutrophils Relative %: 56 % (ref 43–77)
Platelets: 320 10*3/uL (ref 150–400)
RBC: 4.97 MIL/uL (ref 3.87–5.11)
RDW: 12.7 % (ref 11.5–15.5)
WBC: 8.8 10*3/uL (ref 4.0–10.5)

## 2012-07-13 LAB — LIPID PANEL
Cholesterol: 209 mg/dL — ABNORMAL HIGH (ref 0–200)
HDL: 45 mg/dL (ref >39)
LDL Cholesterol: 119 mg/dL — ABNORMAL HIGH (ref 0–99)
Total CHOL/HDL Ratio: 4.6 Ratio
Triglycerides: 226 mg/dL — ABNORMAL HIGH (ref <150)
VLDL: 45 mg/dL — ABNORMAL HIGH (ref 0–40)

## 2012-07-13 LAB — TSH: TSH: 1.092 u[IU]/mL (ref 0.350–4.500)

## 2012-07-13 NOTE — Patient Instructions (Addendum)
See me in 2-3 weeks  Mammogram today

## 2012-07-13 NOTE — Progress Notes (Signed)
Subjective:    Patient ID: Margaret [name], female    DOB: November 11, 1960, 51 y.o.   MRN: [id number]  HPI New pt here for first visit to ask opinion regarding HT.  PMH of advanced DJD  S/P  TKR bilaterally,  HTN,  Hyperlipidemia, hypothyroidism,  And remote migraine headache.  She has FH of colon cancer in her father.    She is concerned over two issues.  She is having prolonged and irregular bleeding.  LMP  06/21/2012 but she skipped several months prior  She is having multiple hot flushes day and night with low libido and vaginal dryness  She is tearful in office as her father died of metastatic colon cancer in July.  She is still grieving and has not been to grief counseling  She has not had a mammogram or a pap smear since 2006.  No personal of FH of breast cancer.  Father had CVA and DVT felt related to cancer,  NO personal or FH of MI or PE.  No personal history of liver disease and she does not smoke  Allergies  Allergen Reactions  . Codeine Nausea Only   Past Medical History  Diagnosis Date  . Total knee replacement status   . Thyroid disease   . Hypertension   . Arthritis    Past Surgical History  Procedure Date  . Joint replacement   . Tubal ligation   . Hand surgery   . Tonsillectomy    History   Social History  . Marital Status: Married    Spouse Name: N/A    Number of Children: N/A  . Years of Education: N/A   Occupational History  . Not on file.   Social History Main Topics  . Smoking status: Former Smoker    Quit date: 07/13/2008  . Smokeless tobacco: Not on file  . Alcohol Use: No  . Drug Use: No  . Sexually Active: Yes   Other Topics Concern  . Not on file   Social History Narrative  . No narrative on file   Family History  Problem Relation Age of Onset  . Hyperlipidemia Mother   . Hypertension Mother   . Heart disease Mother   . Heart disease Father   . Cancer Father   . Kidney disease Father   . Heart disease Sister    Patient Active  Problem List  Diagnosis  . Menopause  . Other and unspecified hyperlipidemia  . DJD (degenerative joint disease)  . Bereavement   Current Outpatient Prescriptions on File Prior to Visit  Medication Sig Dispense Refill  . levothyroxine (SYNTHROID, LEVOTHROID) 200 MCG tablet Take 200 mcg by mouth daily.      Marland Kitchen lisinopril-hydrochlorothiazide (PRINZIDE,ZESTORETIC) 20-25 MG per tablet Take 1 tablet by mouth daily.      . verapamil (CALAN) 120 MG tablet Take 120 mg by mouth 3 (three) times daily.           Review of Systems see HPI   Objective:   Physical Exam  Physical Exam  Nursing note and vitals reviewed.   Teary when discussing father Constitutional: She is oriented to person, place, and time. She appears well-developed and well-nourished.  HENT:  Head: Normocephalic and atraumatic.  Cardiovascular: Normal rate and regular rhythm. Exam reveals no gallop and no friction rub.  No murmur heard.  Pulmonary/Chest: Breath sounds normal. She has no wheezes. She has no rales.  Neurological: She is alert and oriented to person, place, and time.  Skin:  Skin is warm and dry.  Psychiatric: She has a normal mood and affect. Her behavior is normal.            Assessment & Plan:  Perimenopausal  Irregular bleeding:    With prolonged bleeding will get GYN consult for further eval and possible imaging or biopsy  Vasomotor flushes:   will get mammogram and labs, TSH today.   Extensive counseling of risk/benefit of HT and non hormone options.  Will further discuss next visit.  Bereavement:  Advised  To attend hospice grief outpt. counseling

## 2012-07-21 ENCOUNTER — Other Ambulatory Visit: Payer: Self-pay | Admitting: Internal Medicine

## 2012-07-21 DIAGNOSIS — N920 Excessive and frequent menstruation with regular cycle: Secondary | ICD-10-CM | POA: Insufficient documentation

## 2012-07-22 ENCOUNTER — Encounter: Payer: Self-pay | Admitting: Internal Medicine

## 2012-08-02 ENCOUNTER — Ambulatory Visit (INDEPENDENT_AMBULATORY_CARE_PROVIDER_SITE_OTHER): Payer: Medicare Other | Admitting: Internal Medicine

## 2012-08-02 ENCOUNTER — Encounter: Payer: Self-pay | Admitting: Internal Medicine

## 2012-08-02 VITALS — BP 97/61 | HR 84 | Temp 97.4°F | Resp 16 | Wt 248.0 lb

## 2012-08-02 DIAGNOSIS — Z634 Disappearance and death of family member: Secondary | ICD-10-CM

## 2012-08-02 DIAGNOSIS — Z780 Asymptomatic menopausal state: Secondary | ICD-10-CM

## 2012-08-02 DIAGNOSIS — Z8669 Personal history of other diseases of the nervous system and sense organs: Secondary | ICD-10-CM

## 2012-08-02 DIAGNOSIS — N951 Menopausal and female climacteric states: Secondary | ICD-10-CM

## 2012-08-02 DIAGNOSIS — N920 Excessive and frequent menstruation with regular cycle: Secondary | ICD-10-CM

## 2012-08-02 DIAGNOSIS — M1990 Unspecified osteoarthritis, unspecified site: Secondary | ICD-10-CM

## 2012-08-02 DIAGNOSIS — E039 Hypothyroidism, unspecified: Secondary | ICD-10-CM

## 2012-08-02 NOTE — Progress Notes (Signed)
Subjective:    Patient ID: Margaret [name], female    DOB: Jan 04, 1961, 51 y.o.   MRN: [id number]  HPI [name] is here for follow up.  She continues to have day and night flushes and has seen Dr. Arelia Sneddon for her DUB.  She is awaiting an endometrial U/S and possible biopsy.  She has read and voices understanding of risk/benefit sheet including cancer, MI, CVA and DVT/PE.  Due to severity of symptoms she wishes to proceed with HT  She has not started with grief counseling as yet with [facility] but seem to be coping better to death of father  See MM report  Allergies  Allergen Reactions  . Codeine Nausea Only   Past Medical History  Diagnosis Date  . Total knee replacement status   . Thyroid disease   . Hypertension   . Arthritis    Past Surgical History  Procedure Date  . Joint replacement   . Tubal ligation   . Hand surgery   . Tonsillectomy    History   Social History  . Marital Status: Married    Spouse Name: N/A    Number of Children: N/A  . Years of Education: N/A   Occupational History  . Not on file.   Social History Main Topics  . Smoking status: Former Smoker    Quit date: 07/13/2008  . Smokeless tobacco: Not on file  . Alcohol Use: No  . Drug Use: No  . Sexually Active: Yes   Other Topics Concern  . Not on file   Social History Narrative  . No narrative on file   Family History  Problem Relation Age of Onset  . Hyperlipidemia Mother   . Hypertension Mother   . Heart disease Mother   . Heart disease Father   . Cancer Father   . Kidney disease Father   . Heart disease Sister    Patient Active Problem List  Diagnosis  . Menopause  . Other and unspecified hyperlipidemia  . DJD (degenerative joint disease)  . Bereavement  . Essential hypertension, benign  . History of migraine  . Hypothyroidism  . Menorrhagia  . Hot flushes, perimenopausal   Current Outpatient Prescriptions on File Prior to Visit  Medication Sig Dispense Refill  .  celecoxib (CELEBREX) 200 MG capsule Take 200 mg by mouth 2 (two) times daily.      Marland Kitchen HYDROcodone-acetaminophen (NORCO/VICODIN) 5-325 MG per tablet Take 1 tablet by mouth every 6 (six) hours as needed.      Marland Kitchen ibuprofen (ADVIL,MOTRIN) 200 MG tablet Take 400 mg by mouth every 8 (eight) hours as needed.      Marland Kitchen levothyroxine (SYNTHROID, LEVOTHROID) 200 MCG tablet Take 200 mcg by mouth daily.      Marland Kitchen lisinopril-hydrochlorothiazide (PRINZIDE,ZESTORETIC) 20-25 MG per tablet Take 1 tablet by mouth daily.      . verapamil (CALAN) 120 MG tablet Take 120 mg by mouth 3 (three) times daily.           Review of Systems    see HPI Objective:   Physical Exam Physical Exam  Nursing note and vitals reviewed.  Constitutional: She is oriented to person, place, and time. She appears well-developed and well-nourished.  HENT:  Head: Normocephalic and atraumatic.  Cardiovascular: Normal rate and regular rhythm. Exam reveals no gallop and no friction rub.  No murmur heard.  Pulmonary/Chest: Breath sounds normal. She has no wheezes. She has no rales.  Neurological: She is alert and oriented to person, place,  and time.  Skin: Skin is warm and dry.  Psychiatric: She has a normal mood and affect. Her behavior is normal.              Assessment & Plan:  Menorrhagia  Awaiting endometrial results  HOt flushes  MM ok when endometrial U/S and /or bx complete , will initiate HT  Bereavement  Pt states she will call hospice

## 2012-08-03 ENCOUNTER — Ambulatory Visit: Payer: Medicare Other | Admitting: Internal Medicine

## 2012-08-03 DIAGNOSIS — N951 Menopausal and female climacteric states: Secondary | ICD-10-CM | POA: Insufficient documentation

## 2012-08-24 ENCOUNTER — Encounter: Payer: Self-pay | Admitting: Internal Medicine

## 2012-08-31 ENCOUNTER — Encounter: Payer: Medicare Other | Admitting: Internal Medicine

## 2012-09-17 ENCOUNTER — Encounter (HOSPITAL_COMMUNITY): Payer: Self-pay | Admitting: Pharmacist

## 2012-09-20 NOTE — H&P (Signed)
NAMEMARINNA, [name] NO.:  [id number]  MEDICAL RECORD NO.:  [id number]  LOCATION:  [facility]                         FACILITY:  [facility]  PHYSICIAN:  Juluis Mire, M.D.   DATE OF BIRTH:  1961-03-13  DATE OF ADMISSION:  09/13/2012 DATE OF DISCHARGE:                             HISTORY & PHYSICAL   HISTORY OF PRESENT ILLNESS:  The patient is a 51 year old gravida 3, para 3 female presents for hysteroscopy D and C.  The patient was referred into our practice by Dr. Constance Goltz for evaluation of perimenopausal abnormal bleeding.  The patient was having oligomenorrhea when she did have cycles extremely heavy associated with clots.  She had no menopausal symptomatology.  Saline infusion ultrasound revealed multiple endometrial polyps for which she now presents for hysteroscopic resection.  ALLERGIES:  She is allergic to CODEINE that causes nausea.  MEDICATIONS:  She is on, 1. Verapamil 20 mg daily. 2. Lisinopril with hydrochlorothiazide 20/25 one per day. 3. Levothyroxine 200 mcg per day. 4. Celebrex 200 mg per day. 5. H0ydrocodone with acetaminophen 5/325 two or three per day.  PAST MEDICAL HISTORY:  She does have a history of hypertension, is on medications as noted.  History of hypothyroidism.  History of arthritis. Also a history of migraine headaches.  PAST OBSTETRICAL HISTORY:  She had 3 vaginal deliveries.  PAST SURGICAL HISTORY:  In 2000, she had left knee replacement.  In 2000 and 2001 she had carpal tunnel syndrome eventually of both hands.  In December of 2002, she had right knee replacement.  In November 2010, her tonsils removed.  In May 2012, she had left knee replacement.  SOCIAL HISTORY:  Reveals no tobacco or alcohol use.  FAMILY HISTORY:  Father passed away from colon cancer.  Also a history of heart disease.  REVIEW OF SYSTEMS:  Noncontributory.  PHYSICAL EXAMINATION:  VITAL SIGNS:  The patient is afebrile with stable vital signs. HEENT:   The patient is normocephalic.  Pupils equal, round, reactive to light and accommodation.  Extraocular movements were intact.  Sclerae and conjunctivae were clear.  Oropharynx clear. NECK:  Without thyromegaly. BREASTS:  Not examined. LUNGS:  Clear. CARDIOVASCULAR:  Regular rhythm and rate without murmurs or gallops. ABDOMEN:  Benign.  No mass, organomegaly, or tenderness. PELVIC:  Normal external genitalia.  Vaginal mucosa is clear.  Cervix is unremarkable.  Uterus normal size, shape, and contour.  Adnexa free of masses or tenderness. EXTREMITIES:  Trace edema. NEUROLOGIC:  Grossly within normal limits.  IMPRESSION: 1. Perimenopausal abnormal bleeding with evidence of endometrial     polyps. 2. Hypertension under management. 3. Rheumatoid arthritis. 4. Hypothyroidism. 5. Migraine headaches.  PLAN:  The patient to undergo hysteroscopy D and C.  The risk of the procedure have been discussed including the risk of infection.  The risk of hemorrhage that could require transfusion with the risk of AIDS or hepatitis.  Excessive bleeding could require hysterectomy.  There is a risk of perforation with injury to adjacent organs including bowel that could require further exploratory surgery.  Risk of deep venous thrombosis and pulmonary embolus.  The patient expressed understanding of indications and risks.     Juluis Mire,  M.D.     JSM/MEDQ  D:  09/20/2012  T:  09/20/2012  Job:  [id number]  cc:   [place], M.D.

## 2012-09-20 NOTE — H&P (Signed)
  Patient name  Margaret [name], Umali DICTATION#  [id number] CSN# [id number]  Juluis Mire, MD 09/20/2012 6:06 AM

## 2012-09-21 ENCOUNTER — Encounter (HOSPITAL_COMMUNITY): Payer: Self-pay

## 2012-09-21 ENCOUNTER — Encounter (HOSPITAL_COMMUNITY)
Admission: RE | Admit: 2012-09-21 | Discharge: 2012-09-21 | Disposition: A | Discharge status: Home / Self Care | Payer: Medicare Other | Source: Ambulatory Visit | Attending: Obstetrics and Gynecology | Admitting: Obstetrics and Gynecology

## 2012-09-21 LAB — BASIC METABOLIC PANEL
BUN: 16 mg/dL (ref 6–23)
CO2: 30 mEq/L (ref 19–32)
Calcium: 9.7 mg/dL (ref 8.4–10.5)
Chloride: 99 mEq/L (ref 96–112)
Creatinine, Ser: 0.57 mg/dL (ref 0.50–1.10)
GFR calc Af Amer: >90 mL/min (ref >90)
GFR calc non Af Amer: >90 mL/min (ref >90)
Glucose, Bld: 96 mg/dL (ref 70–99)
Potassium: 4.5 mEq/L (ref 3.5–5.1)
Sodium: 137 mEq/L (ref 135–145)

## 2012-09-21 LAB — CBC
HCT: 40.3 % (ref 36.0–46.0)
Hemoglobin: 13.4 g/dL (ref 12.0–15.0)
MCH: 28.9 pg (ref 26.0–34.0)
MCHC: 33.3 g/dL (ref 30.0–36.0)
MCV: 86.9 fL (ref 78.0–100.0)
Platelets: 270 10*3/uL (ref 150–400)
RBC: 4.64 MIL/uL (ref 3.87–5.11)
RDW: 12.9 % (ref 11.5–15.5)
WBC: 7.4 10*3/uL (ref 4.0–10.5)

## 2012-09-21 NOTE — Pre-Procedure Instructions (Signed)
Pt saw Dr Jacinto Halim 12/

## 2012-09-21 NOTE — Patient Instructions (Addendum)
   Your procedure is scheduled ZO:[id number] December 6th  Enter through the Main Entrance of [facility] at:7am Pick up the phone at the desk and dial [phone] and inform us of your arrival.  Please call this number if you have any problems the morning of surgery: [phone]  Remember: Do not eat or drink anything after midnight on Thursday Please take your verapamil, Lisinopril/HCTZ, and morning meds with sips of water day of surgery  Do not wear jewelry, make-up, or FINGER nail polish No metal in your hair or on your body. Do not wear lotions, powders, perfumes. You may wear deodorant.  Please use your CHG wash as directed prior to surgery.  Do not shave anywhere for at least 12 hours prior to first CHG shower.  Do not bring valuables to the hospital. dentures or bridgework may not be worn into surgery. Please bring a protective case for your eyeglasses     Patients discharged on the day of surgery will not be allowed to drive home.

## 2012-09-22 NOTE — Pre-Procedure Instructions (Signed)
Spoke with Dr Rodman Pickle concerning pt clearance from Dr Ane Payment for [facility] but not for upcoming total knee (will need stress test prior to TK)  Dr Rodman Pickle reviewed with Dr Sherron Ales over phone-pt will need paracervical block with MAC or spinal with sedation but not general.

## 2012-09-23 ENCOUNTER — Encounter (HOSPITAL_COMMUNITY)
Admission: RE | Disposition: A | Discharge status: Home / Self Care | Payer: Self-pay | Source: Ambulatory Visit | Attending: Obstetrics and Gynecology

## 2012-09-23 ENCOUNTER — Ambulatory Visit (HOSPITAL_COMMUNITY): Payer: Medicare Other | Admitting: Anesthesiology

## 2012-09-23 ENCOUNTER — Encounter (HOSPITAL_COMMUNITY): Payer: Self-pay | Admitting: Anesthesiology

## 2012-09-23 ENCOUNTER — Ambulatory Visit (HOSPITAL_COMMUNITY)
Admission: RE | Admit: 2012-09-23 | Discharge: 2012-09-23 | Disposition: A | Discharge status: Home / Self Care | Payer: Medicare Other | Source: Ambulatory Visit | Attending: Obstetrics and Gynecology | Admitting: Obstetrics and Gynecology

## 2012-09-23 DIAGNOSIS — Z01818 Encounter for other preprocedural examination: Secondary | ICD-10-CM | POA: Insufficient documentation

## 2012-09-23 DIAGNOSIS — Z01812 Encounter for preprocedural laboratory examination: Secondary | ICD-10-CM | POA: Insufficient documentation

## 2012-09-23 DIAGNOSIS — N840 Polyp of corpus uteri: Secondary | ICD-10-CM | POA: Insufficient documentation

## 2012-09-23 DIAGNOSIS — N803 Endometriosis of pelvic peritoneum, unspecified: Secondary | ICD-10-CM | POA: Insufficient documentation

## 2012-09-23 HISTORY — PX: DILATATION & CURRETTAGE/HYSTEROSCOPY WITH RESECTOCOPE: SHX5572

## 2012-09-23 LAB — HCG, SERUM, QUALITATIVE: Preg, Serum: NEGATIVE

## 2012-09-23 SURGERY — DILATATION & CURETTAGE/HYSTEROSCOPY WITH RESECTOCOPE
Anesthesia: Monitor Anesthesia Care | Site: Vagina

## 2012-09-23 MED ORDER — FENTANYL CITRATE 0.05 MG/ML IJ SOLN
INTRAMUSCULAR | Status: DC | PRN
  Administered 2012-09-23: 100 ug via INTRAVENOUS

## 2012-09-23 MED ORDER — KETOROLAC TROMETHAMINE 30 MG/ML IJ SOLN
INTRAMUSCULAR | Status: DC | PRN
  Administered 2012-09-23: 30 mg via INTRAVENOUS

## 2012-09-23 MED ORDER — PROPOFOL 10 MG/ML IV EMUL
INTRAVENOUS | Status: AC
  Filled 2012-09-23: qty 20

## 2012-09-23 MED ORDER — MIDAZOLAM HCL 2 MG/2ML IJ SOLN
INTRAMUSCULAR | Status: AC
  Filled 2012-09-23: qty 2

## 2012-09-23 MED ORDER — FENTANYL CITRATE 0.05 MG/ML IJ SOLN: 25.0000 ug | INTRAMUSCULAR | Status: DC | PRN

## 2012-09-23 MED ORDER — FENTANYL CITRATE 0.05 MG/ML IJ SOLN
INTRAMUSCULAR | Status: AC
  Filled 2012-09-23: qty 5

## 2012-09-23 MED ORDER — KETOROLAC TROMETHAMINE 30 MG/ML IJ SOLN
INTRAMUSCULAR | Status: AC
  Filled 2012-09-23: qty 1

## 2012-09-23 MED ORDER — LACTATED RINGERS IV SOLN
INTRAVENOUS | Status: DC
  Administered 2012-09-23 (×2): via INTRAVENOUS

## 2012-09-23 MED ORDER — CEFAZOLIN SODIUM-DEXTROSE 2-3 GM-% IV SOLR
INTRAVENOUS | Status: AC
  Filled 2012-09-23: qty 50

## 2012-09-23 MED ORDER — ONDANSETRON HCL 4 MG/2ML IJ SOLN
INTRAMUSCULAR | Status: DC | PRN
  Administered 2012-09-23: 4 mg via INTRAVENOUS

## 2012-09-23 MED ORDER — LIDOCAINE-EPINEPHRINE (PF) 1 %-1:200000 IJ SOLN
INTRAMUSCULAR | Status: AC
  Filled 2012-09-23: qty 10

## 2012-09-23 MED ORDER — MIDAZOLAM HCL 5 MG/5ML IJ SOLN
INTRAMUSCULAR | Status: DC | PRN
  Administered 2012-09-23: 2 mg via INTRAVENOUS

## 2012-09-23 MED ORDER — HYDROMORPHONE HCL 2 MG PO TABS
2.0000 mg | ORAL_TABLET | Freq: Once | ORAL | Status: AC
  Administered 2012-09-23: 2 mg via ORAL

## 2012-09-23 MED ORDER — PROPOFOL 10 MG/ML IV EMUL
INTRAVENOUS | Status: DC | PRN
  Administered 2012-09-23 (×6): 50 mg via INTRAVENOUS

## 2012-09-23 MED ORDER — LIDOCAINE HCL (CARDIAC) 20 MG/ML IV SOLN
INTRAVENOUS | Status: DC | PRN
  Administered 2012-09-23: 40 mg via INTRAVENOUS

## 2012-09-23 MED ORDER — GLYCINE 1.5 % IR SOLN
Status: DC | PRN
  Administered 2012-09-23: 3000 mL

## 2012-09-23 MED ORDER — ONDANSETRON HCL 4 MG/2ML IJ SOLN
INTRAMUSCULAR | Status: AC
  Filled 2012-09-23: qty 2

## 2012-09-23 MED ORDER — CEFAZOLIN SODIUM-DEXTROSE 2-3 GM-% IV SOLR
2.0000 g | INTRAVENOUS | Status: AC
  Administered 2012-09-23: 2 g via INTRAVENOUS

## 2012-09-23 MED ORDER — LIDOCAINE HCL (CARDIAC) 20 MG/ML IV SOLN
INTRAVENOUS | Status: AC
  Filled 2012-09-23: qty 5

## 2012-09-23 MED ORDER — HYDROMORPHONE HCL 2 MG PO TABS
ORAL_TABLET | ORAL | Status: AC
  Filled 2012-09-23: qty 1

## 2012-09-23 MED ORDER — LIDOCAINE HCL 1 % IJ SOLN
INTRAMUSCULAR | Status: DC | PRN
  Administered 2012-09-23: 20 mL

## 2012-09-23 SURGICAL SUPPLY — 15 items
CANISTER SUCTION 2500CC (MISCELLANEOUS) ×2 IMPLANT
CATH ROBINSON RED A/P 16FR (CATHETERS) ×2 IMPLANT
CLOTH BEACON ORANGE TIMEOUT ST (SAFETY) ×2 IMPLANT
CONTAINER PREFILL 10% NBF 60ML (FORM) ×4 IMPLANT
DRESSING TELFA 8X3 (GAUZE/BANDAGES/DRESSINGS) ×2 IMPLANT
ELECT REM PT RETURN 9FT ADLT (ELECTROSURGICAL) ×2
ELECTRODE REM PT RTRN 9FT ADLT (ELECTROSURGICAL) ×1 IMPLANT
GLOVE BIO SURGEON STRL SZ7 (GLOVE) ×4 IMPLANT
GOWN STRL REIN XL XLG (GOWN DISPOSABLE) ×4 IMPLANT
LOOP ANGLED CUTTING 22FR (CUTTING LOOP) ×2 IMPLANT
NEEDLE SPNL 20GX3.5 QUINCKE YW (NEEDLE) ×2 IMPLANT
PACK HYSTEROSCOPY LF (CUSTOM PROCEDURE TRAY) ×2 IMPLANT
PAD OB MATERNITY 4.3X12.25 (PERSONAL CARE ITEMS) ×2 IMPLANT
TOWEL OR 17X24 6PK STRL BLUE (TOWEL DISPOSABLE) ×4 IMPLANT
WATER STERILE IRR 1000ML POUR (IV SOLUTION) ×2 IMPLANT

## 2012-09-23 NOTE — Anesthesia Postprocedure Evaluation (Signed)
  Anesthesia Post-op Note  Patient: Margaret [name]  Procedure(s) Performed: Procedure(s) (LRB) with comments: DILATATION & CURETTAGE/HYSTEROSCOPY WITH RESECTOCOPE (N/A)  Patient is awake and responsive. Pain and nausea are reasonably well controlled. Vital signs are stable and clinically acceptable. Oxygen saturation is clinically acceptable. There are no apparent anesthetic complications at this time. Patient is ready for discharge.

## 2012-09-23 NOTE — Transfer of Care (Signed)
Immediate Anesthesia Transfer of Care Note  Patient: Margaret [name]  Procedure(s) Performed: Procedure(s) (LRB) with comments: DILATATION & CURETTAGE/HYSTEROSCOPY WITH RESECTOCOPE (N/A)  Patient Location: PACU  Anesthesia Type:MAC  Level of Consciousness: awake, alert  and oriented  Airway & Oxygen Therapy: Patient Spontanous Breathing  Post-op Assessment: Report given to PACU RN and Post -op Vital signs reviewed and stable  Post vital signs: stable  Complications: No apparent anesthesia complications

## 2012-09-23 NOTE — Op Note (Signed)
Patient name  Amazin, Pincock DICTATION#  [id number] CSN# [id number]  [facility], MD 09/23/2012 8:55 AM

## 2012-09-23 NOTE — H&P (Signed)
  History and physical exam unchanged 

## 2012-09-23 NOTE — Op Note (Signed)
Margaret [name], Margaret [name]              ACCOUNT NO.:  [id number]  MEDICAL RECORD NO.:  [id number]  LOCATION:  [facility]                          FACILITY:  [facility]  PHYSICIAN:  Juluis Mire, M.D.   DATE OF BIRTH:  03-27-1961  DATE OF PROCEDURE:  09/23/2012 DATE OF DISCHARGE:                              OPERATIVE REPORT   PREOPERATIVE DIAGNOSIS:  Endometrial polyp.  POSTOPERATIVE DIAGNOSIS:  Endometrial polyp.  PROCEDURES:  Paracervical block.  Dilation and curettage.  Hysteroscopy with resection of endometriotic implants.  SURGEON:  Juluis Mire, MD  ANESTHESIA:  Paracervical block and sedation.  BLOOD LOSS:  Minimal.  PACKS AND DRAINS:  None.  INTRAOPERATIVE BLOOD PLACED:  None.  COMPLICATIONS:  None.  INDICATIONS:  Are as dictated history and physical.  PROCEDURE IN DETAIL:  The patient was taken to OR, placed in supine position.  After satisfactory level of sedation, she was placed in the dorsal lithotomy position.  Perineum and vagina were prepped out with Betadine and draped sterile field.  A speculum was placed in vaginal vault.  Cervix was grasped with single-tooth tenaculum.  A paracervical block was instituted using approximately 20 mL of 1% Xylocaine.  Uterus sounded to 9 cm.  Cervix was serially dilated to a size 30 Pratt dilator.  Operative hysteroscope was introduced.  Intrauterine cavity was distended using glycine.  Two endometrial polyps were noted, one on the left sidewall, and one on the fundal area, both were resected and sent for pathological review.  Endometrial samplings were obtained. Cystoscope was then removed.  Total deficit was 60 mL.  Curettings were also obtained and sent for Pathology.  Single-tooth tenaculum was then removed.  The patient was taken out of the dorsal lithotomy position, once alert, transferred to recovery room in good condition. Sponge, instrument, and needle count was reported as correct by circulating nurse x2.  The patient  tolerated procedure well, was returned to recovery room in good condition.     Juluis Mire, M.D.     JSM/MEDQ  D:  09/23/2012  T:  09/23/2012  Job:  [id number]

## 2012-09-23 NOTE — Anesthesia Preprocedure Evaluation (Signed)
Anesthesia Evaluation  Patient identified by MRN, date of birth, ID band Patient awake    Reviewed: Allergy & Precautions, H&P , Patient's Chart, lab work & pertinent test results, reviewed documented beta blocker date and time   Airway Mallampati: II TM Distance: >3 FB Neck ROM: full    Dental No notable dental hx.    Pulmonary  breath sounds clear to auscultation  Pulmonary exam normal       Cardiovascular hypertension (well controlled, no CAD sx), Pt. on medications Rhythm:regular Rate:Normal     Neuro/Psych    GI/Hepatic   Endo/Other    Renal/GU      Musculoskeletal   Abdominal   Peds  Hematology   Anesthesia Other Findings   Reproductive/Obstetrics                           Anesthesia Physical Anesthesia Plan  ASA: III  Anesthesia Plan: General and MAC   Post-op Pain Management:    Induction: Intravenous  Airway Management Planned: LMA, Mask and Natural Airway  Additional Equipment:   Intra-op Plan:   Post-operative Plan:   Informed Consent: I have reviewed the patients History and Physical, chart, labs and discussed the procedure including the risks, benefits and alternatives for the proposed anesthesia with the patient or authorized representative who has indicated his/her understanding and acceptance.   Dental Advisory Given  Plan Discussed with: CRNA and Surgeon  Anesthesia Plan Comments: (  Discussed MAC vs  general anesthesia, including possible nausea, instrumentation of airway, sore throat,pulmonary aspiration, etc. I asked if the were any outstanding questions, or  concerns before we proceeded. )        Anesthesia Quick Evaluation

## 2012-09-23 NOTE — Brief Op Note (Signed)
09/23/2012  8:54 AM  PATIENT:  Margaret [name]  51 y.o. female  PRE-OPERATIVE DIAGNOSIS:  polyp  POST-OPERATIVE DIAGNOSIS:  polyp  PROCEDURE:  Procedure(s) (LRB) with comments: DILATATION & CURETTAGE/HYSTEROSCOPY WITH RESECTOCOPE (N/A)  SURGEON:  Surgeon(s) and Role:    * Juluis Mire, MD - Primary  PHYSICIAN ASSISTANT:   ASSISTANTS: none   ANESTHESIA:   paracervical block and MAC  EBL:  Total I/O In: 1000 [I.V.:1000] Out: 25 [Blood:25]  BLOOD ADMINISTERED:none  DRAINS: none   LOCAL MEDICATIONS USED:  XYLOCAINE  and Amount: 20 ml  SPECIMEN:  Source of Specimen:  endometrial polyps, endometrial resections and currettings  DISPOSITION OF SPECIMEN:  PATHOLOGY  COUNTS:  YES  TOURNIQUET:  * No tourniquets in log *  DICTATION: .Other Dictation: Dictation Number [id number]  PLAN OF CARE: Discharge to home after PACU  PATIENT DISPOSITION:  PACU - hemodynamically stable.   Delay start of Pharmacological VTE agent (>24hrs) due to surgical blood loss or risk of bleeding: not applicable

## 2012-09-26 ENCOUNTER — Encounter (HOSPITAL_COMMUNITY): Payer: Self-pay | Admitting: Obstetrics and Gynecology

## 2012-10-25 ENCOUNTER — Encounter (HOSPITAL_COMMUNITY): Payer: Self-pay

## 2012-11-02 ENCOUNTER — Encounter (HOSPITAL_COMMUNITY)
Admission: RE | Admit: 2012-11-02 | Discharge: 2012-11-02 | Disposition: A | Discharge status: Home / Self Care | Payer: Medicare Other | Source: Ambulatory Visit | Attending: Orthopedic Surgery | Admitting: Orthopedic Surgery

## 2012-11-02 ENCOUNTER — Encounter (HOSPITAL_COMMUNITY)
Admission: RE | Admit: 2012-11-02 | Discharge: 2012-11-02 | Disposition: A | Discharge status: Home / Self Care | Payer: Medicare Other | Source: Ambulatory Visit | Attending: Orthopaedic Surgery | Admitting: Orthopaedic Surgery

## 2012-11-02 ENCOUNTER — Encounter (HOSPITAL_COMMUNITY): Payer: Self-pay

## 2012-11-02 HISTORY — DX: Mental disorder, not otherwise specified: F99

## 2012-11-02 HISTORY — DX: Hypothyroidism, unspecified: E03.9

## 2012-11-02 LAB — URINE MICROSCOPIC-ADD ON

## 2012-11-02 LAB — HCG, SERUM, QUALITATIVE: Preg, Serum: NEGATIVE

## 2012-11-02 LAB — COMPREHENSIVE METABOLIC PANEL
ALT: 11 U/L (ref 0–35)
AST: 12 U/L (ref 0–37)
Albumin: 4.1 g/dL (ref 3.5–5.2)
Alkaline Phosphatase: 67 U/L (ref 39–117)
BUN: 18 mg/dL (ref 6–23)
CO2: 31 mEq/L (ref 19–32)
Calcium: 9.6 mg/dL (ref 8.4–10.5)
Chloride: 98 mEq/L (ref 96–112)
Creatinine, Ser: 0.52 mg/dL (ref 0.50–1.10)
GFR calc Af Amer: >90 mL/min (ref >90)
GFR calc non Af Amer: >90 mL/min (ref >90)
Glucose, Bld: 97 mg/dL (ref 70–99)
Potassium: 4.2 mEq/L (ref 3.5–5.1)
Sodium: 137 mEq/L (ref 135–145)
Total Bilirubin: 0.3 mg/dL (ref 0.3–1.2)
Total Protein: 7 g/dL (ref 6.0–8.3)

## 2012-11-02 LAB — CBC
HCT: 40.1 % (ref 36.0–46.0)
Hemoglobin: 13.7 g/dL (ref 12.0–15.0)
MCH: 29.4 pg (ref 26.0–34.0)
MCHC: 34.2 g/dL (ref 30.0–36.0)
MCV: 86.1 fL (ref 78.0–100.0)
Platelets: 277 10*3/uL (ref 150–400)
RBC: 4.66 MIL/uL (ref 3.87–5.11)
RDW: 12.6 % (ref 11.5–15.5)
WBC: 8 10*3/uL (ref 4.0–10.5)

## 2012-11-02 LAB — URINALYSIS, ROUTINE W REFLEX MICROSCOPIC
Bilirubin Urine: NEGATIVE
Glucose, UA: NEGATIVE mg/dL
Ketones, ur: NEGATIVE mg/dL
Leukocytes, UA: NEGATIVE
Nitrite: NEGATIVE
Protein, ur: NEGATIVE mg/dL
Specific Gravity, Urine: 1.018 (ref 1.005–1.030)
Urobilinogen, UA: 0.2 mg/dL (ref 0.0–1.0)
pH: 6.5 (ref 5.0–8.0)

## 2012-11-02 LAB — PROTIME-INR
INR: 0.99 (ref 0.00–1.49)
Prothrombin Time: 13 seconds (ref 11.6–15.2)

## 2012-11-02 LAB — TYPE AND SCREEN
ABO/RH(D): A POS
Antibody Screen: NEGATIVE

## 2012-11-02 LAB — APTT: aPTT: 31 seconds (ref 24–37)

## 2012-11-02 LAB — ABO/RH: ABO/RH(D): A POS

## 2012-11-02 LAB — SURGICAL PCR SCREEN
MRSA, PCR: POSITIVE — AB
Staphylococcus aureus: POSITIVE — AB

## 2012-11-02 NOTE — Pre-Procedure Instructions (Signed)
KELSYE LOOMER  11/02/2012   Your procedure is scheduled on:  Tuesday, January 21st   Report to [facility] at 8:15 AM.  Call this number if you have problems the morning of surgery: [phone]   Remember:   Do not eat food or drink liquids after midnight, which also includes water, tea & coffee.    Take these medicines the morning of surgery with A SIP OF WATER: Norco, Levothyroxine, Paxil,               Verapamil   Do not wear jewelry, make-up or nail polish.  Do not wear lotions, powders, or perfumes. You may NOT wear deodorant.  Ladies--Do not shave your underarms or legs 48 hours prior to surgery.    Do not bring valuables to the hospital.  Contacts, dentures or bridgework may not be worn into surgery.   Leave suitcase in the car. After surgery it may be brought to your room.  For patients admitted to the hospital, checkout time is 11:00 AM the day of discharge.   Patients discharged the day of surgery will not be allowed to drive home.   Name and phone number of your driver:    Special Instructions: Shower using CHG 2 nights before surgery and the night before surgery.  If you shower the day of surgery use CHG.  Use special wash - you have one bottle of CHG for all showers.  You should use approximately 1/3 of the bottle for each shower.   Please read over the following fact sheets that you were given: Pain Booklet, Coughing and Deep Breathing, Blood Transfusion Information, MRSA Information and Surgical Site Infection Prevention

## 2012-11-02 NOTE — Progress Notes (Signed)
Call to Dr. Verl Dicker office, requested ekg, last OV note, stress test.

## 2012-11-03 NOTE — Progress Notes (Signed)
Info req'd from dr Jacinto Halim rec'd and on chart.

## 2012-11-07 MED ORDER — CEFAZOLIN SODIUM-DEXTROSE 2-3 GM-% IV SOLR
2.0000 g | INTRAVENOUS | Status: AC
  Administered 2012-11-08: 2 g via INTRAVENOUS
  Filled 2012-11-07: qty 50

## 2012-11-07 NOTE — H&P (Signed)
CHIEF COMPLAINT:  Painful right knee.    HISTORY OF PRESENT ILLNESS:  Margaret [name] is a very pleasant 52 year old white female who is seen today for evaluation of her right knee.  She, in December 2002, had a right total knee arthroplasty performed by Dr. Eulah Pont and I was the assistant at that time.  She had done well until April 2013 when she started developing pain in the right total knee.  It was gradual and then all of a sudden it worsened to the point where she has been having this intermittent moderate and severe throbbing pain with some sharpness to it.  They would wake her at night time and worsen with activities.  She had tried conservative treatment in the past, but it did not help.  She was seen on 02/24/2012 and at that time laboratory studies were obtained as well as an aspiration of the knee.  Cultures did not show anything.  Her CBC diff, CMET, sed rate and CRP were normal.  A three phase bone scan revealed loosening of the right tibial plate.  She had started having problems even with activities of daily living and had worsened.  She did lose her father last year and was having to take care of her dad at that time and she continues now to take care of her mother.  She was not able to consider having any type of surgery until now.  The pain has become almost unbearable.  She does use a brace, which does give her some support.  The pain has worsened and now she is having pain with every step.  She is seen today for reevaluation.     PAST MEDICAL HISTORY:  Her general health is good.     HOSPITALIZATIONS:  Include that in;  1.  June 1999, left knee arthroscopy.   2.  June 2000, left knee unicompartmental replacement.  3.  November 2000, right carpal tunnel release.  4.  January 2001, left carpal tunnel release.  5.  March 2002, right knee arthroscopy. 6.  December 2002, right total knee arthroplasty. 7.  November 2010, tonsillectomy. 8.  May 2012, left total knee replacement.  9.  December  2013, endometrial polypectomy.   10.  In 1983, 1985, 1987 childbirth.     MEDICATIONS: 1.  Verapamil 120 mg daily. 2.  Lisinopril/hydrochlorothiazide 20/25 daily. 3.  Levothyroxine 200 mcg daily. 4.  Celebrex 200 mg daily. 5.  Phentermine 37.5 mg daily.  6.  Atorvastatin 40 mg at bedtime. 7.  Voltaren gel t.i.d. to q.i.d.  8.  Paxil 10 mg at bedtime. 9.  Advil 200 mg 2-3 a day. 10.  Benadryl 25 mg daily.  11.  Hydrocodone/acetaminophen 5/325 at 2 every 12 hours.    ALLERGIES:  Codeine, but she is fine with Percocet and hydrocodone.    REVIEW OF SYSTEMS:  A 14-point review of systems positive for glasses as well as upper and lower dentures.  She has had hypertension and is controlled for the last 10 years on medications.  She does have hypothyroidism for the past 10 years and is on Synthroid replacement.  She has had headaches and migraines in the past, but nothing recent.  She has had hot flashes as well as irregular menses and she is on Paxil for this.  She has had endometrial polypectomy recently.     FAMILY HISTORY:  Positive for hypertension, arthritis and renal disease in her mother with strokes and cancer in her father.  Her sisters have heart  disease and hypertension.    SOCIAL HISTORY:  Margaret [name] is a 52 year old white, married female.  She is a housewife and disabled.  She smoked 1 pack of cigarettes per day for 25 years, but quit in 2009.  She does not drink.     PHYSICAL EXAMINATION:  Today reveals a 52 year old white female, well developed, well-nourished, alert, pleasant and cooperative in moderate distress secondary to right knee pain.  Her height is 5 feet 6 inches and weighs 250 pounds with a BMI of 40.3.   Vital signs:  Temperature 97, pulse 64, respiration 16, blood pressure 132/73.   Head:  Normocephalic. Eyes:  Pupils equal, round, reactive to light and accommodation with extraocular movement intact.  HEENT:  Ear, nose and throat were benign. Neck:  Supple and no bruits  were noted.  Chest:  Had good expansion. Lungs:  Essentially clear.  Cardiac:  Regular rhythm and rate, normal S1 and S2, no murmur was noted.  Pulses:  Were 1+ bilateral and symmetric in the lower extremities. Abdomen:  Obese, soft, nontender with normal bowel sounds present.  CNS:  She is alert and oriented x3 and cranial nerves II-XII grossly intact. Genital, rectal and breast exam not indicated for an orthopedic evaluation.  Musculoskeletal:  She does have range of motion to about 100 degrees.  Pseudolaxity with varus and valgus stressing.  She does have a 1+ drawer.  Boggy synovium with a trace to 1+ effusion.  Calf is supple and nontender.  Good hip motion bilaterally.  Well-healed surgical incision over the knee.     LABS:  Her synovial fluid from 02/24/2012 revealed anaerobic and aerobic cultures show no growth after 3 days.  The fluid from that day showed no crystals.  Glucose 29 and total protein was 2.  Her sed rate from that day was 4 and CRP was 0.25.     RADIOGRAPHS:  A bone scan from 03/01/2012 revealed that there is marked uptake or delayed images in the right tibial plateau suggesting either a fracture or hardware complication involving the tibial tray of the right total knee arthroplasty.  There is also increased uptake in the patella, however, this is roughly symmetric compared to the contralateral side.  The increased uptake in the left knee is not expected given the history of a total knee arthroplasty.     A DEXA scan was also performed on 08/31/2012 and that revealed a T-score of +1.6.  This was in the femoral neck.  The lumbar spine also revealed very good at +2.6.     CLINICAL IMPRESSION:   1.  Probable loose total knee arthroplasty of the right knee. 2.  Exogenous obesity.  3.  Hypertension. 4.  Irregular menses.     RECOMMENDATIONS:  At this time we have discussed conservative and operative treatment.  She is, at this time, so symptomatic that she would like to  consider revision total knee arthroplasty.  The procedures, risks and benefits were explained to her in detail and she is understanding of this.  We have gone over all of the complications including that of death.  I have also reviewed an intravenous Lexiscan faxed over on 10/25/2011 and at that time they had felt that based on the study the patient could be taken to surgery with an acceptable low cardiovascular risk.  Therefore we will proceed with a revision total knee arthroplasty in the very near future.  Again, all questions were answered and the procedure was gone over using the model  of the total knee.  All questions were answered   Oris Drone. Aleda Grana [facility] [phone]  11/07/2012 8:46 AM

## 2012-11-08 ENCOUNTER — Encounter (HOSPITAL_COMMUNITY): Payer: Self-pay | Admitting: *Deleted

## 2012-11-08 ENCOUNTER — Ambulatory Visit (HOSPITAL_COMMUNITY): Payer: Medicare Other | Admitting: Anesthesiology

## 2012-11-08 ENCOUNTER — Ambulatory Visit (HOSPITAL_COMMUNITY): Payer: Medicare Other

## 2012-11-08 ENCOUNTER — Encounter (HOSPITAL_COMMUNITY)
Admission: RE | Disposition: A | Discharge status: Home Health | Payer: Self-pay | Source: Ambulatory Visit | Attending: Orthopaedic Surgery

## 2012-11-08 ENCOUNTER — Encounter (HOSPITAL_COMMUNITY): Payer: Self-pay | Admitting: Anesthesiology

## 2012-11-08 ENCOUNTER — Inpatient Hospital Stay (HOSPITAL_COMMUNITY)
Admission: RE | Admit: 2012-11-08 | Discharge: 2012-11-10 | DRG: 467 | Disposition: A | Discharge status: Home Health | Payer: Medicare Other | Source: Ambulatory Visit | Attending: Orthopaedic Surgery | Admitting: Orthopaedic Surgery

## 2012-11-08 DIAGNOSIS — D62 Acute posthemorrhagic anemia: Secondary | ICD-10-CM | POA: Diagnosis not present

## 2012-11-08 DIAGNOSIS — Z7901 Long term (current) use of anticoagulants: Secondary | ICD-10-CM

## 2012-11-08 DIAGNOSIS — T8489XA Other specified complication of internal orthopedic prosthetic devices, implants and grafts, initial encounter: Secondary | ICD-10-CM | POA: Diagnosis present

## 2012-11-08 DIAGNOSIS — Y831 Surgical operation with implant of artificial internal device as the cause of abnormal reaction of the patient, or of later complication, without mention of misadventure at the time of the procedure: Secondary | ICD-10-CM | POA: Diagnosis present

## 2012-11-08 DIAGNOSIS — Z87891 Personal history of nicotine dependence: Secondary | ICD-10-CM

## 2012-11-08 DIAGNOSIS — Y92009 Unspecified place in unspecified non-institutional (private) residence as the place of occurrence of the external cause: Secondary | ICD-10-CM

## 2012-11-08 DIAGNOSIS — I10 Essential (primary) hypertension: Secondary | ICD-10-CM | POA: Diagnosis present

## 2012-11-08 DIAGNOSIS — Z96659 Presence of unspecified artificial knee joint: Secondary | ICD-10-CM

## 2012-11-08 DIAGNOSIS — Z6841 Body Mass Index (BMI) 40.0 and over, adult: Secondary | ICD-10-CM

## 2012-11-08 DIAGNOSIS — Z01812 Encounter for preprocedural laboratory examination: Secondary | ICD-10-CM

## 2012-11-08 DIAGNOSIS — Z8249 Family history of ischemic heart disease and other diseases of the circulatory system: Secondary | ICD-10-CM

## 2012-11-08 DIAGNOSIS — Z823 Family history of stroke: Secondary | ICD-10-CM

## 2012-11-08 DIAGNOSIS — T84039A Mechanical loosening of unspecified internal prosthetic joint, initial encounter: Principal | ICD-10-CM | POA: Diagnosis present

## 2012-11-08 DIAGNOSIS — Z79899 Other long term (current) drug therapy: Secondary | ICD-10-CM

## 2012-11-08 DIAGNOSIS — T84012A Broken internal right knee prosthesis, initial encounter: Secondary | ICD-10-CM

## 2012-11-08 DIAGNOSIS — E039 Hypothyroidism, unspecified: Secondary | ICD-10-CM | POA: Diagnosis present

## 2012-11-08 HISTORY — PX: TOTAL KNEE REVISION: SHX996

## 2012-11-08 LAB — GRAM STAIN

## 2012-11-08 SURGERY — TOTAL KNEE REVISION
Anesthesia: General | Site: Knee | Laterality: Right | Wound class: Clean

## 2012-11-08 MED ORDER — FENTANYL CITRATE 0.05 MG/ML IJ SOLN
INTRAMUSCULAR | Status: AC
  Administered 2012-11-08: 100 ug via INTRAVENOUS
  Filled 2012-11-08: qty 2

## 2012-11-08 MED ORDER — FENTANYL CITRATE 0.05 MG/ML IJ SOLN
INTRAMUSCULAR | Status: DC | PRN
  Administered 2012-11-08 (×2): 50 ug via INTRAVENOUS
  Administered 2012-11-08: 100 ug via INTRAVENOUS
  Administered 2012-11-08: 50 ug via INTRAVENOUS

## 2012-11-08 MED ORDER — ALUM & MAG HYDROXIDE-SIMETH 200-200-20 MG/5ML PO SUSP: 30.0000 mL | ORAL | Status: DC | PRN

## 2012-11-08 MED ORDER — METHOCARBAMOL 500 MG PO TABS
500.0000 mg | ORAL_TABLET | Freq: Four times a day (QID) | ORAL | Status: DC | PRN
  Administered 2012-11-08 – 2012-11-10 (×6): 500 mg via ORAL
  Filled 2012-11-08 (×6): qty 1

## 2012-11-08 MED ORDER — ACETAMINOPHEN 10 MG/ML IV SOLN
1000.0000 mg | Freq: Four times a day (QID) | INTRAVENOUS | Status: AC
  Administered 2012-11-08 – 2012-11-09 (×4): 1000 mg via INTRAVENOUS
  Filled 2012-11-08 (×4): qty 100

## 2012-11-08 MED ORDER — MAGNESIUM HYDROXIDE 400 MG/5ML PO SUSP: 30.0000 mL | Freq: Every day | ORAL | Status: DC | PRN

## 2012-11-08 MED ORDER — HYDROMORPHONE HCL PF 1 MG/ML IJ SOLN
INTRAMUSCULAR | Status: AC
  Filled 2012-11-08: qty 1

## 2012-11-08 MED ORDER — ARTIFICIAL TEARS OP OINT
TOPICAL_OINTMENT | OPHTHALMIC | Status: DC | PRN
  Administered 2012-11-08: 1 via OPHTHALMIC

## 2012-11-08 MED ORDER — THROMBIN 20000 UNITS EX KIT
PACK | CUTANEOUS | Status: AC
  Filled 2012-11-08: qty 1

## 2012-11-08 MED ORDER — LACTATED RINGERS IV SOLN
INTRAVENOUS | Status: DC | PRN
  Administered 2012-11-08 (×4): via INTRAVENOUS

## 2012-11-08 MED ORDER — LIDOCAINE-EPINEPHRINE 1 %-1:100000 IJ SOLN
INTRAMUSCULAR | Status: DC | PRN
  Administered 2012-11-08: 10 mL via INTRADERMAL

## 2012-11-08 MED ORDER — FENTANYL CITRATE 0.05 MG/ML IJ SOLN
50.0000 ug | INTRAMUSCULAR | Status: DC | PRN
  Administered 2012-11-08: 100 ug via INTRAVENOUS

## 2012-11-08 MED ORDER — LEVOTHYROXINE SODIUM 200 MCG PO TABS
200.0000 ug | ORAL_TABLET | Freq: Every day | ORAL | Status: DC
  Administered 2012-11-09 – 2012-11-10 (×2): 200 ug via ORAL
  Filled 2012-11-08 (×3): qty 1

## 2012-11-08 MED ORDER — LISINOPRIL-HYDROCHLOROTHIAZIDE 20-25 MG PO TABS: 1.0000 | ORAL_TABLET | Freq: Every day | ORAL | Status: DC

## 2012-11-08 MED ORDER — METOCLOPRAMIDE HCL 10 MG PO TABS: 5.0000 mg | ORAL_TABLET | Freq: Three times a day (TID) | ORAL | Status: DC | PRN

## 2012-11-08 MED ORDER — BUPIVACAINE-EPINEPHRINE 0.25% -1:200000 IJ SOLN
INTRAMUSCULAR | Status: DC | PRN
  Administered 2012-11-08: 30 mL

## 2012-11-08 MED ORDER — ONDANSETRON HCL 4 MG/2ML IJ SOLN: 4.0000 mg | Freq: Once | INTRAMUSCULAR | Status: DC | PRN

## 2012-11-08 MED ORDER — RIVAROXABAN 10 MG PO TABS
10.0000 mg | ORAL_TABLET | Freq: Every day | ORAL | Status: DC
  Administered 2012-11-09 – 2012-11-10 (×2): 10 mg via ORAL
  Filled 2012-11-08 (×3): qty 1

## 2012-11-08 MED ORDER — SODIUM CHLORIDE 0.9 % IR SOLN
Status: DC | PRN
  Administered 2012-11-08: 1000 mL
  Administered 2012-11-08: 3000 mL

## 2012-11-08 MED ORDER — LIDOCAINE HCL (CARDIAC) 20 MG/ML IV SOLN
INTRAVENOUS | Status: DC | PRN
  Administered 2012-11-08: 60 mg via INTRAVENOUS

## 2012-11-08 MED ORDER — CEFAZOLIN SODIUM-DEXTROSE 2-3 GM-% IV SOLR
2.0000 g | Freq: Four times a day (QID) | INTRAVENOUS | Status: AC
  Administered 2012-11-08 – 2012-11-09 (×2): 2 g via INTRAVENOUS
  Filled 2012-11-08 (×2): qty 50

## 2012-11-08 MED ORDER — BUPIVACAINE-EPINEPHRINE 0.25% -1:200000 IJ SOLN
INTRAMUSCULAR | Status: AC
  Filled 2012-11-08: qty 1

## 2012-11-08 MED ORDER — ACETAMINOPHEN 10 MG/ML IV SOLN
INTRAVENOUS | Status: AC
  Filled 2012-11-08: qty 100

## 2012-11-08 MED ORDER — SODIUM CHLORIDE 0.9 % IV SOLN: INTRAVENOUS | Status: DC

## 2012-11-08 MED ORDER — BISACODYL 10 MG RE SUPP: 10.0000 mg | Freq: Every day | RECTAL | Status: DC | PRN

## 2012-11-08 MED ORDER — METOCLOPRAMIDE HCL 5 MG/ML IJ SOLN: 5.0000 mg | Freq: Three times a day (TID) | INTRAMUSCULAR | Status: DC | PRN

## 2012-11-08 MED ORDER — ONDANSETRON HCL 4 MG PO TABS: 4.0000 mg | ORAL_TABLET | Freq: Four times a day (QID) | ORAL | Status: DC | PRN

## 2012-11-08 MED ORDER — HYDROCHLOROTHIAZIDE 25 MG PO TABS
25.0000 mg | ORAL_TABLET | Freq: Every day | ORAL | Status: DC
  Administered 2012-11-09 – 2012-11-10 (×2): 25 mg via ORAL
  Filled 2012-11-08 (×2): qty 1

## 2012-11-08 MED ORDER — MENTHOL 3 MG MT LOZG: 1.0000 | LOZENGE | OROMUCOSAL | Status: DC | PRN

## 2012-11-08 MED ORDER — ONDANSETRON HCL 4 MG/2ML IJ SOLN
INTRAMUSCULAR | Status: DC | PRN
  Administered 2012-11-08: 4 mg via INTRAVENOUS

## 2012-11-08 MED ORDER — BUPIVACAINE HCL (PF) 0.5 % IJ SOLN
INTRAMUSCULAR | Status: DC | PRN
  Administered 2012-11-08: 20 mL

## 2012-11-08 MED ORDER — ACETAMINOPHEN 10 MG/ML IV SOLN
1000.0000 mg | Freq: Once | INTRAVENOUS | Status: AC
  Administered 2012-11-08: 1000 mg via INTRAVENOUS
  Filled 2012-11-08: qty 100

## 2012-11-08 MED ORDER — SODIUM CHLORIDE 0.9 % IV SOLN
75.0000 mL/h | INTRAVENOUS | Status: DC
  Administered 2012-11-08: 75 mL/h via INTRAVENOUS

## 2012-11-08 MED ORDER — PROPOFOL 10 MG/ML IV BOLUS
INTRAVENOUS | Status: DC | PRN
  Administered 2012-11-08: 200 mg via INTRAVENOUS

## 2012-11-08 MED ORDER — OXYCODONE HCL 5 MG PO TABS
5.0000 mg | ORAL_TABLET | ORAL | Status: DC | PRN
  Administered 2012-11-08 – 2012-11-09 (×5): 10 mg via ORAL
  Filled 2012-11-08 (×4): qty 2

## 2012-11-08 MED ORDER — DOCUSATE SODIUM 100 MG PO CAPS
100.0000 mg | ORAL_CAPSULE | Freq: Two times a day (BID) | ORAL | Status: DC
  Administered 2012-11-08 – 2012-11-10 (×4): 100 mg via ORAL
  Filled 2012-11-08 (×4): qty 1

## 2012-11-08 MED ORDER — VERAPAMIL HCL 120 MG PO TABS
120.0000 mg | ORAL_TABLET | Freq: Every day | ORAL | Status: DC
  Administered 2012-11-09 – 2012-11-10 (×2): 120 mg via ORAL
  Filled 2012-11-08 (×3): qty 1

## 2012-11-08 MED ORDER — HYDROMORPHONE HCL PF 1 MG/ML IJ SOLN
0.5000 mg | INTRAMUSCULAR | Status: DC | PRN
  Administered 2012-11-09 – 2012-11-10 (×10): 0.5 mg via INTRAVENOUS
  Filled 2012-11-08 (×10): qty 1

## 2012-11-08 MED ORDER — GLYCOPYRROLATE 0.2 MG/ML IJ SOLN
INTRAMUSCULAR | Status: DC | PRN
  Administered 2012-11-08: 0.2 mg via INTRAVENOUS
  Administered 2012-11-08: 0.6 mg via INTRAVENOUS
  Administered 2012-11-08: 0.2 mg via INTRAVENOUS

## 2012-11-08 MED ORDER — CHLORHEXIDINE GLUCONATE 4 % EX LIQD: 60.0000 mL | Freq: Every day | CUTANEOUS | Status: DC

## 2012-11-08 MED ORDER — NEOSTIGMINE METHYLSULFATE 1 MG/ML IJ SOLN
INTRAMUSCULAR | Status: DC | PRN
  Administered 2012-11-08: 4 mg via INTRAVENOUS

## 2012-11-08 MED ORDER — ONDANSETRON HCL 4 MG/2ML IJ SOLN: 4.0000 mg | Freq: Four times a day (QID) | INTRAMUSCULAR | Status: DC | PRN

## 2012-11-08 MED ORDER — PAROXETINE HCL 10 MG PO TABS
10.0000 mg | ORAL_TABLET | Freq: Every day | ORAL | Status: DC
  Administered 2012-11-08 – 2012-11-09 (×2): 10 mg via ORAL
  Filled 2012-11-08 (×3): qty 1

## 2012-11-08 MED ORDER — LIDOCAINE HCL 4 % MT SOLN
OROMUCOSAL | Status: DC | PRN
  Administered 2012-11-08: 4 mL via TOPICAL

## 2012-11-08 MED ORDER — THROMBIN 20000 UNITS EX KIT
PACK | CUTANEOUS | Status: DC | PRN
  Administered 2012-11-08: 20000 [IU] via TOPICAL

## 2012-11-08 MED ORDER — LISINOPRIL 20 MG PO TABS
20.0000 mg | ORAL_TABLET | Freq: Every day | ORAL | Status: DC
  Administered 2012-11-09 – 2012-11-10 (×2): 20 mg via ORAL
  Filled 2012-11-08 (×2): qty 1

## 2012-11-08 MED ORDER — ROCURONIUM BROMIDE 100 MG/10ML IV SOLN
INTRAVENOUS | Status: DC | PRN
  Administered 2012-11-08: 35 mg via INTRAVENOUS

## 2012-11-08 MED ORDER — ATORVASTATIN CALCIUM 40 MG PO TABS
40.0000 mg | ORAL_TABLET | Freq: Every day | ORAL | Status: DC
  Administered 2012-11-08 – 2012-11-09 (×2): 40 mg via ORAL
  Filled 2012-11-08 (×3): qty 1

## 2012-11-08 MED ORDER — HYDROMORPHONE HCL PF 1 MG/ML IJ SOLN
0.2500 mg | INTRAMUSCULAR | Status: DC | PRN
  Administered 2012-11-08 (×6): 0.5 mg via INTRAVENOUS

## 2012-11-08 MED ORDER — LACTATED RINGERS IV SOLN
INTRAVENOUS | Status: DC
  Administered 2012-11-08: 10:00:00 via INTRAVENOUS

## 2012-11-08 MED ORDER — PHENOL 1.4 % MT LIQD: 1.0000 | OROMUCOSAL | Status: DC | PRN

## 2012-11-08 MED ORDER — FLEET ENEMA 7-19 GM/118ML RE ENEM: 1.0000 | ENEMA | Freq: Once | RECTAL | Status: AC | PRN

## 2012-11-08 MED ORDER — KETOROLAC TROMETHAMINE 15 MG/ML IJ SOLN
15.0000 mg | Freq: Four times a day (QID) | INTRAMUSCULAR | Status: AC
  Administered 2012-11-08 – 2012-11-09 (×2): 15 mg via INTRAVENOUS
  Filled 2012-11-08 (×2): qty 1

## 2012-11-08 MED ORDER — OXYCODONE HCL 5 MG PO TABS
ORAL_TABLET | ORAL | Status: AC
  Filled 2012-11-08: qty 2

## 2012-11-08 MED ORDER — METHOCARBAMOL 100 MG/ML IJ SOLN
500.0000 mg | Freq: Four times a day (QID) | INTRAMUSCULAR | Status: DC | PRN
  Administered 2012-11-08: 500 mg via INTRAVENOUS
  Filled 2012-11-08: qty 5

## 2012-11-08 MED ORDER — CHLORHEXIDINE GLUCONATE 4 % EX LIQD: 60.0000 mL | Freq: Once | CUTANEOUS | Status: DC

## 2012-11-08 SURGICAL SUPPLY — 90 items
ADAPTER BOLT FEMORAL +2/-2 (Knees) ×2 IMPLANT
ADPR FEM +2/-2 OFST BOLT (Knees) ×1 IMPLANT
ADPR FEM 5D STRL KN PFC SGM (Orthopedic Implant) ×1 IMPLANT
AUG FEM SZ3 4 CMB POST STRL LF (Knees) ×2 IMPLANT
AUG FEM SZ3 4 STRL LF KN RT TI (Knees) ×2 IMPLANT
AUG TIB SZ4 10 REV STP WDG (Knees) ×1 IMPLANT
BANDAGE ESMARK 6X9 LF (GAUZE/BANDAGES/DRESSINGS) ×2 IMPLANT
BLADE SAGITTAL 25.0X1.19X90 (BLADE) ×2 IMPLANT
BLADE SAW SGTL 13.0X1.19X90.0M (BLADE) ×2 IMPLANT
BLADE SAW SGTL 81X20 HD (BLADE) ×2 IMPLANT
BNDG CMPR 9X6 STRL LF SNTH (GAUZE/BANDAGES/DRESSINGS) ×2
BNDG ESMARK 6X9 LF (GAUZE/BANDAGES/DRESSINGS) ×4
BONE CEMENT GENTAMICIN (Cement) ×6 IMPLANT
BOWL SMART MIX CTS (DISPOSABLE) ×2 IMPLANT
CEMENT BONE GENTAMICIN 40 (Cement) ×3 IMPLANT
CLOTH BEACON ORANGE TIMEOUT ST (SAFETY) ×2 IMPLANT
COMP FEM CEM RT SZ3 (Orthopedic Implant) ×2 IMPLANT
COMPONENT FEM CEM RT SZ3 (Orthopedic Implant) ×1 IMPLANT
CONT SPECI 4OZ STER CLIK (MISCELLANEOUS) ×4 IMPLANT
COVER SURGICAL LIGHT HANDLE (MISCELLANEOUS) ×4 IMPLANT
CUFF TOURNIQUET SINGLE 34IN LL (TOURNIQUET CUFF) ×2 IMPLANT
CUFF TOURNIQUET SINGLE 44IN (TOURNIQUET CUFF) IMPLANT
DISTAL WEDGE PFC 4MM RIGHT (Knees) ×4 IMPLANT
DRAPE EXTREMITY T 121X128X90 (DRAPE) ×2 IMPLANT
DRAPE INCISE IOBAN 66X45 STRL (DRAPES) ×2 IMPLANT
DRAPE PROXIMA HALF (DRAPES) ×8 IMPLANT
DRAPE TABLE COVER HEAVY DUTY (DRAPES) ×2 IMPLANT
DRAPE X-RAY CASS 24X20 (DRAPES) ×2 IMPLANT
DRSG ADAPTIC 3X8 NADH LF (GAUZE/BANDAGES/DRESSINGS) ×2 IMPLANT
DRSG PAD ABDOMINAL 8X10 ST (GAUZE/BANDAGES/DRESSINGS) ×4 IMPLANT
DURAPREP 26ML APPLICATOR (WOUND CARE) ×4 IMPLANT
ELECT CAUTERY BLADE 6.4 (BLADE) ×4 IMPLANT
ELECT REM PT RETURN 9FT ADLT (ELECTROSURGICAL) ×2
ELECTRODE REM PT RTRN 9FT ADLT (ELECTROSURGICAL) ×1 IMPLANT
EVACUATOR 1/8 PVC DRAIN (DRAIN) ×2 IMPLANT
FACESHIELD LNG OPTICON STERILE (SAFETY) ×4 IMPLANT
FEMORAL ADAPTER (Orthopedic Implant) ×2 IMPLANT
GLOVE BIOGEL PI IND STRL 6.5 (GLOVE) ×2 IMPLANT
GLOVE BIOGEL PI IND STRL 7.0 (GLOVE) ×2 IMPLANT
GLOVE BIOGEL PI IND STRL 8 (GLOVE) ×1 IMPLANT
GLOVE BIOGEL PI IND STRL 8.5 (GLOVE) ×2 IMPLANT
GLOVE BIOGEL PI INDICATOR 6.5 (GLOVE) ×2
GLOVE BIOGEL PI INDICATOR 7.0 (GLOVE) ×2
GLOVE BIOGEL PI INDICATOR 8 (GLOVE) ×1
GLOVE BIOGEL PI INDICATOR 8.5 (GLOVE) ×2
GLOVE ECLIPSE 6.5 STRL STRAW (GLOVE) ×4 IMPLANT
GLOVE ECLIPSE 7.0 STRL STRAW (GLOVE) ×6 IMPLANT
GLOVE ECLIPSE 8.0 STRL XLNG CF (GLOVE) ×10 IMPLANT
GLOVE SURG ORTHO 8.5 STRL (GLOVE) ×8 IMPLANT
GOWN PREVENTION PLUS XLARGE (GOWN DISPOSABLE) ×4 IMPLANT
GOWN STRL NON-REIN LRG LVL3 (GOWN DISPOSABLE) ×4 IMPLANT
GOWN STRL REIN 2XL XLG LVL4 (GOWN DISPOSABLE) ×2 IMPLANT
GOWN STRL REIN 3XL XLG LVL4 (GOWN DISPOSABLE) IMPLANT
HANDPIECE INTERPULSE COAX TIP (DISPOSABLE) ×2
INSERT TIBIAL PFC SIG SZ3 10MM (Knees) ×2 IMPLANT
KIT BASIN OR (CUSTOM PROCEDURE TRAY) ×2 IMPLANT
KIT ROOM TURNOVER OR (KITS) ×2 IMPLANT
MANIFOLD NEPTUNE II (INSTRUMENTS) ×2 IMPLANT
NEEDLE 22X1 1/2 (OR ONLY) (NEEDLE) ×2 IMPLANT
NS IRRIG 1000ML POUR BTL (IV SOLUTION) ×2 IMPLANT
PACK TOTAL JOINT (CUSTOM PROCEDURE TRAY) ×2 IMPLANT
PAD ARMBOARD 7.5X6 YLW CONV (MISCELLANEOUS) ×2 IMPLANT
PAD CAST 4YDX4 CTTN HI CHSV (CAST SUPPLIES) ×1 IMPLANT
PADDING CAST COTTON 4X4 STRL (CAST SUPPLIES) ×2
PADDING CAST COTTON 6X4 STRL (CAST SUPPLIES) ×2 IMPLANT
PENCIL BUTTON HOLSTER BLD 10FT (ELECTRODE) ×2 IMPLANT
POST AVE PFC 4MM (Knees) ×4 IMPLANT
SET HNDPC FAN SPRY TIP SCT (DISPOSABLE) ×1 IMPLANT
SPONGE GAUZE 4X4 12PLY (GAUZE/BANDAGES/DRESSINGS) ×2 IMPLANT
SPONGE LAP 18X18 X RAY DECT (DISPOSABLE) ×4 IMPLANT
STAPLER VISISTAT 35W (STAPLE) ×4 IMPLANT
STEM UNIVERSAL REVISION 75X12 (Stem) ×2 IMPLANT
STEM UNIVERSAL REVISION 75X16 (Stem) ×2 IMPLANT
SUCTION FRAZIER TIP 10 FR DISP (SUCTIONS) ×2 IMPLANT
SUT BONE WAX W31G (SUTURE) ×2 IMPLANT
SUT ETHIBOND NAB CT1 #1 30IN (SUTURE) ×8 IMPLANT
SUT MNCRL AB 3-0 PS2 18 (SUTURE) ×2 IMPLANT
SUT VIC AB 0 CT1 27 (SUTURE) ×2
SUT VIC AB 0 CT1 27XBRD ANBCTR (SUTURE) ×1 IMPLANT
SUT VIC AB 2-0 FS1 27 (SUTURE) IMPLANT
SWAB COLLECTION DEVICE MRSA (MISCELLANEOUS) ×2 IMPLANT
SYR CONTROL 10ML LL (SYRINGE) ×2 IMPLANT
TOWER CARTRIDGE SMART MIX (DISPOSABLE) IMPLANT
TRAY FOLEY CATH 14FR (SET/KITS/TRAYS/PACK) ×2 IMPLANT
TRAY REVISION SZ 4 (Knees) ×2 IMPLANT
TUBE ANAEROBIC SPECIMEN COL (MISCELLANEOUS) ×2 IMPLANT
WATER STERILE IRR 1000ML POUR (IV SOLUTION) ×2 IMPLANT
WEDGE DISTAL PFC RIGHT 4MM (Knees) ×2 IMPLANT
WEDGE SIZE 4 10MM (Knees) ×2 IMPLANT
WRAP KNEE MAXI GEL POST OP (GAUZE/BANDAGES/DRESSINGS) ×2 IMPLANT

## 2012-11-08 NOTE — Plan of Care (Signed)
Problem: Consults Goal: Diagnosis- Total Joint Replacement Primary Total Knee Right     

## 2012-11-08 NOTE — Preoperative (Signed)
Beta Blockers   Reason not to administer Beta Blockers:Not Applicable 

## 2012-11-08 NOTE — Op Note (Signed)
PATIENT ID:      Margaret [name]  MRN:     [id number] DOB/AGE:    March 30, 1961 / 52 y.o.       OPERATIVE REPORT    DATE OF PROCEDURE:  11/08/2012       PREOPERATIVE DIAGNOSIS:   Painful loose Right Total Knee Arthroplasty                                                       Estimated Body mass index is 40.67 kg/(m^2) as calculated from the following:   Height as of 09/21/12: 5\' 6" (1.676 m).   Weight as of 09/21/12: 252 lb(114.306 kg).     POSTOPERATIVE DIAGNOSIS:   Painful loose Right Total Knee Arthroplasty                                                                     Estimated Body mass index is 40.67 kg/(m^2) as calculated from the following:   Height as of 09/21/12: 5\' 6" (1.676 m).   Weight as of 09/21/12: 252 lb(114.306 kg).     PROCEDURE:  Procedure(s): TOTAL KNEE REVISION right     SURGEON:  Norlene Campbell, MD    ASSISTANT:   Jacqualine Code, PA-C   (Present and scrubbed throughout the case, critical for assistance with exposure, retraction, instrumentation, and closure.)          ANESTHESIA: regional and general     DRAINS: (right knee) Hemovact drain(s) in the open with  Suction Open :      TOURNIQUET TIME:  Total Tourniquet Time Documented: Thigh (Right) - 122 minutes    COMPLICATIONS:  None   CONDITION:  stable  PROCEDURE IN DETAIL: [id number]   [name], [name] 11/08/2012, 2:19 PM

## 2012-11-08 NOTE — Transfer of Care (Signed)
Immediate Anesthesia Transfer of Care Note  Patient: Margaret [name]  Procedure(s) Performed: Procedure(s) (LRB) with comments: TOTAL KNEE REVISION (Right) - Revision Right Total Knee Replacement   Patient Location: PACU  Anesthesia Type:General and Regional  Level of Consciousness: awake, alert , oriented and sedated  Airway & Oxygen Therapy: Patient Spontanous Breathing and Patient connected to nasal cannula oxygen  Post-op Assessment: Report given to PACU RN, Post -op Vital signs reviewed and stable and Patient moving all extremities  Post vital signs: Reviewed and stable  Complications: No apparent anesthesia complications

## 2012-11-08 NOTE — Anesthesia Preprocedure Evaluation (Signed)
Anesthesia Evaluation  Patient identified by MRN, date of birth, ID band Patient awake    Reviewed: Allergy & Precautions, H&P , NPO status , Patient's Chart, lab work & pertinent test results  Airway Mallampati: I TM Distance: >3 FB Neck ROM: full    Dental   Pulmonary          Cardiovascular hypertension, Rhythm:regular Rate:Normal     Neuro/Psych PSYCHIATRIC DISORDERS Anxiety    GI/Hepatic   Endo/Other  Hypothyroidism   Renal/GU      Musculoskeletal   Abdominal   Peds  Hematology   Anesthesia Other Findings   Reproductive/Obstetrics                           Anesthesia Physical Anesthesia Plan  ASA: II  Anesthesia Plan: General   Post-op Pain Management: MAC Combined w/ Regional for Post-op pain   Induction: Intravenous  Airway Management Planned: Oral ETT  Additional Equipment:   Intra-op Plan:   Post-operative Plan: Extubation in OR  Informed Consent:   Plan Discussed with: CRNA, Anesthesiologist and Surgeon  Anesthesia Plan Comments:         Anesthesia Quick Evaluation

## 2012-11-08 NOTE — Anesthesia Procedure Notes (Addendum)
Anesthesia Regional Block:  Femoral nerve block  Pre-Anesthetic Checklist: ,, timeout performed, Correct Patient, Correct Site, Correct Laterality, Correct Procedure, Correct Position, site marked, Risks and benefits discussed,  Surgical consent,  Pre-op evaluation,  At surgeon's request and post-op pain management  Laterality: Right  Prep: Maximum Sterile Barrier Precautions used, chloraprep and alcohol swabs       Needles:  Injection technique: Single-shot  Needle Type: Stimulator Needle - 80          Additional Needles:  Procedures: nerve stimulator Femoral nerve block  Nerve Stimulator or Paresthesia:  Response: 0.5 mA, 0.1 ms, 7 cm  Additional Responses:   Narrative:  Start time: 11/08/2012 9:45 AM End time: 11/08/2012 9:50 AM Injection made incrementally with aspirations every 5 mL.  Performed by: Personally  Anesthesiologist: Maren Beach MD  Additional Notes: Pt accepts procedure and risks. 30cc ( 20cc 0.5% Marcaine and 10cc 1% Lidocaine ) w/o difficulty or discomfort. GES   Procedure Name: Intubation Date/Time: 11/08/2012 10:48 AM Performed by: Fransisca Kaufmann Pre-anesthesia Checklist: Patient identified, Timeout performed, Emergency Drugs available, Suction available and Patient being monitored Patient Re-evaluated:Patient Re-evaluated prior to inductionOxygen Delivery Method: Circle system utilized Preoxygenation: Pre-oxygenation with 100% oxygen Intubation Type: IV induction Ventilation: Mask ventilation without difficulty Laryngoscope Size: Miller and 2 Grade View: Grade I Tube type: Oral Tube size: 7.5 mm Number of attempts: 1 Airway Equipment and Method: Stylet Placement Confirmation: ETT inserted through vocal cords under direct vision,  breath sounds checked- equal and bilateral and positive ETCO2 Secured at: 22 cm Tube secured with: Tape Dental Injury: Teeth and Oropharynx as per pre-operative assessment

## 2012-11-08 NOTE — Progress Notes (Signed)
Patient ID: Margaret [name], female   DOB: Mar 31, 1961, 52 y.o.   MRN: [id number] The recent History & Physical has been reviewed. I have personally examined the patient today. There is no interval change to the documented History & Physical. The patient would like to proceed with the procedure.  Norlene Campbell W 11/08/2012,  10:12 AM

## 2012-11-08 NOTE — Anesthesia Postprocedure Evaluation (Signed)
  Anesthesia Post-op Note  Patient: Margaret [name]  Procedure(s) Performed: Procedure(s) (LRB) with comments: TOTAL KNEE REVISION (Right) - Revision Right Total Knee Replacement   Patient Location: PACU  Anesthesia Type:General  Level of Consciousness: awake, alert , oriented and patient cooperative  Airway and Oxygen Therapy: Patient Spontanous Breathing and Patient connected to nasal cannula oxygen  Post-op Pain: moderate  Post-op Assessment: Post-op Vital signs reviewed, Patient's Cardiovascular Status Stable, Respiratory Function Stable, Patent Airway, No signs of Nausea or vomiting and Pain level controlled  Post-op Vital Signs: stable  Complications: No apparent anesthesia complications

## 2012-11-08 NOTE — Progress Notes (Signed)
Orthopedic Tech Progress Note Patient Details:  Margaret [name] August 22, 1961 [id number]  CPM Right Knee CPM Right Knee: On Right Knee Flexion (Degrees): 60  Right Knee Extension (Degrees): 0  Additional Comments: applied overhead frame   [name] 11/08/2012, 3:34 PM

## 2012-11-09 LAB — BASIC METABOLIC PANEL
BUN: 11 mg/dL (ref 6–23)
CO2: 28 mEq/L (ref 19–32)
Calcium: 8.3 mg/dL — ABNORMAL LOW (ref 8.4–10.5)
Chloride: 103 mEq/L (ref 96–112)
Creatinine, Ser: 0.47 mg/dL — ABNORMAL LOW (ref 0.50–1.10)
GFR calc Af Amer: >90 mL/min (ref >90)
GFR calc non Af Amer: >90 mL/min (ref >90)
Glucose, Bld: 127 mg/dL — ABNORMAL HIGH (ref 70–99)
Potassium: 3.5 mEq/L (ref 3.5–5.1)
Sodium: 137 mEq/L (ref 135–145)

## 2012-11-09 LAB — CBC
HCT: 31.5 % — ABNORMAL LOW (ref 36.0–46.0)
Hemoglobin: 10.8 g/dL — ABNORMAL LOW (ref 12.0–15.0)
MCH: 29.3 pg (ref 26.0–34.0)
MCHC: 34.3 g/dL (ref 30.0–36.0)
MCV: 85.6 fL (ref 78.0–100.0)
Platelets: 186 10*3/uL (ref 150–400)
RBC: 3.68 MIL/uL — ABNORMAL LOW (ref 3.87–5.11)
RDW: 12.6 % (ref 11.5–15.5)
WBC: 7.9 10*3/uL (ref 4.0–10.5)

## 2012-11-09 MED ORDER — CHLORHEXIDINE GLUCONATE CLOTH 2 % EX PADS: 6.0000 | MEDICATED_PAD | Freq: Every day | CUTANEOUS | Status: DC

## 2012-11-09 MED ORDER — HYDROMORPHONE HCL 2 MG PO TABS
2.0000 mg | ORAL_TABLET | ORAL | Status: DC | PRN
  Administered 2012-11-09 (×4): 4 mg via ORAL
  Administered 2012-11-09: 2 mg via ORAL
  Administered 2012-11-10: 4 mg via ORAL
  Administered 2012-11-10: 2 mg via ORAL
  Administered 2012-11-10 (×2): 4 mg via ORAL
  Filled 2012-11-09: qty 2
  Filled 2012-11-09 (×2): qty 1
  Filled 2012-11-09 (×6): qty 2

## 2012-11-09 MED ORDER — CEFAZOLIN SODIUM-DEXTROSE 2-3 GM-% IV SOLR
2.0000 g | Freq: Four times a day (QID) | INTRAVENOUS | Status: AC
  Administered 2012-11-09 – 2012-11-10 (×5): 2 g via INTRAVENOUS
  Filled 2012-11-09 (×6): qty 50

## 2012-11-09 MED ORDER — ACETAMINOPHEN 10 MG/ML IV SOLN
1000.0000 mg | Freq: Four times a day (QID) | INTRAVENOUS | Status: AC
  Administered 2012-11-09: 1000 mg via INTRAVENOUS
  Filled 2012-11-09: qty 100

## 2012-11-09 MED ORDER — ACETAMINOPHEN 500 MG PO TABS: 1000.0000 mg | ORAL_TABLET | Freq: Four times a day (QID) | ORAL | Status: DC

## 2012-11-09 MED ORDER — ACETAMINOPHEN 10 MG/ML IV SOLN
1000.0000 mg | Freq: Four times a day (QID) | INTRAVENOUS | Status: DC
  Filled 2012-11-09: qty 100

## 2012-11-09 MED ORDER — MUPIROCIN 2 % EX OINT
1.0000 "application " | TOPICAL_OINTMENT | Freq: Two times a day (BID) | CUTANEOUS | Status: DC
  Administered 2012-11-09 – 2012-11-10 (×2): 1 via NASAL
  Filled 2012-11-09: qty 22

## 2012-11-09 NOTE — Progress Notes (Signed)
Patient ID: Margaret [name], female   DOB: 12/24/1960, 52 y.o.   MRN: [id number] PATIENT ID: Margaret [name]        MRN:  [id number]          DOB/AGE: 12/04/1960 / 52 y.o.    Norlene Campbell, MD   Jacqualine Code, PA-C [address] [place], [place]  [address]                             [phone]   PROGRESS NOTE  Subjective:  negative for Chest Pain  negative for Shortness of Breath  negative for Nausea/Vomiting   negative for Calf Pain    Tolerating Diet: yes         Patient reports pain as moderate and severe.     Had a rough night with pain.  Had been on narcotics preop and is somewhat refractory to pain meds.  Overall, states doing all right.  Objective: Vital signs in last 24 hours:   Patient Vitals for the past 24 hrs:  BP Temp Temp src Pulse Resp SpO2  11/09/12 0554 118/54 mmHg 98.5 F (36.9 C) Oral 69  16  96 %  11/09/12 0445 - - - - 18  99 %  11/09/12 0144 128/62 mmHg 98.7 F (37.1 C) Oral 73  16  100 %  11/08/12 2216 134/53 mmHg 98.1 F (36.7 C) Oral 71  16  100 %  11/08/12 2052 - - - - 18  96 %  11/08/12 1750 149/72 mmHg 98 F (36.7 C) Oral 91  18  95 %  11/08/12 1730 - 97.5 F (36.4 C) - 80  12  100 %  11/08/12 1715 149/71 mmHg - - 80  13  100 %  11/08/12 1700 - - - 73  13  100 %  11/08/12 1645 - - - 79  20  100 %  11/08/12 1630 - - - 80  14  100 %  11/08/12 1615 148/73 mmHg 97.2 F (36.2 C) - 74  15  96 %  11/08/12 1600 137/64 mmHg - - 76  9  97 %  11/08/12 1545 142/64 mmHg - - 74  18  100 %  11/08/12 1530 128/78 mmHg - - 76  18  96 %  11/08/12 1515 146/70 mmHg - - 72  12  98 %  11/08/12 1500 127/55 mmHg - - 73  20  96 %  11/08/12 1448 128/55 mmHg 98.6 F (37 C) - 76  19  95 %  11/08/12 1445 - - - 77  - 95 %  11/08/12 1005 - - - 69  20  100 %  11/08/12 0947 - - - 62  16  98 %  11/08/12 0852 124/75 mmHg 98.3 F (36.8 C) Oral 69  20  98 %      Intake/Output from previous day:   01/21 0701 - 01/22 0700 In: 3755 [I.V.:3700] Out: 1970  [Urine:1495; Drains:325]  100cc last shift hemovac   Intake/Output this shift:       Intake/Output      01/21 0701 - 01/22 0700 01/22 0701 - 01/23 0700   I.V. 3700    IV Piggyback 55    Total Intake 3755    Urine 1495    Drains 325    Blood 150    Total Output 1970    Net +1785  LABORATORY DATA:  Basename 11/09/12 0520 11/02/12 1543  WBC 7.9 8.0  HGB 10.8* 13.7  HCT 31.5* 40.1  PLT 186 277    Basename 11/09/12 0520 11/02/12 1543  NA 137 137  K 3.5 4.2  CL 103 98  CO2 28 31  BUN 11 18  CREATININE 0.47* 0.52  GLUCOSE 127* 97  CALCIUM 8.3* 9.6   Lab Results  Component Value Date   INR 0.99 11/02/2012   INR 1.03 03/13/2011   INR 1.0 08/16/2008    Recent Radiographic Studies :   Chest 2 View  11/02/2012  *RADIOLOGY REPORT*  Clinical Data: Preop.  CHEST - 2 VIEW  Comparison: 03/13/2011.  Findings: Trachea is midline.  Heart size normal.  Lungs are clear. No pleural fluid.  IMPRESSION: No acute findings.   Original Report Authenticated By: Leanna Battles, M.D.    Dg Knee Right Port  11/08/2012  *RADIOLOGY REPORT*  Clinical Data: Right knee revision  PORTABLE RIGHT KNEE - 1-2 VIEW  Comparison: None  Findings: Two operative views were obtained of the proximal tibia. The knee joint is not fully imaged.  Intramedullary metal marker is seen in the proximal tibia.  Osteotomy of the proximal tibia.  IMPRESSION: Surgical localization.   Original Report Authenticated By: Janeece Riggers, M.D.      Examination:  General appearance: alert, cooperative and moderate distress  Wound Exam: clean, dry, intact dressing  Drainage:  None: wound tissue dry  Motor Exam: EHL, FHL, Anterior Tibial and Posterior Tibial Intact  Sensory Exam: Superficial Peroneal, Deep Peroneal and Tibial normal  Vascular Exam: Right posterior tibial artery has 1+ (weak) pulse  Assessment:    1 Day Post-Op  Procedure(s) (LRB): TOTAL KNEE REVISION (Right)  ADDITIONAL DIAGNOSIS:  Active  Problems:  * No active hospital problems. *   Acute Blood Loss Anemia asymptomatic   Plan: Physical Therapy as ordered Partial Weight Bearing @ 50% (PWB)  DVT Prophylaxis:  Xarelto, Foot Pumps and TED hose  DISCHARGE PLAN: Home  DISCHARGE NEEDS: HHPT, CPM, Walker and 3-in-1 comode seat  Continue IV antibx for one more day  [facility]       [facility] 11/09/2012 8:08 AM

## 2012-11-09 NOTE — Progress Notes (Signed)
UR COMPLETED  

## 2012-11-09 NOTE — Evaluation (Signed)
Physical Therapy Evaluation Patient Details Name: Margaret [name] MRN: [id number] DOB: [birth year] Today's Date: 11/09/2012 Time: 7829-5621 PT Time Calculation (min): 16 min  PT Assessment / Plan / Recommendation Clinical Impression  Pt is a 52 y/o female s/p R TKA revison.  Pt doing well with mobility.  Acute PT will continue to follow pt in preparation for d/c to home with HHPT.      PT Assessment  Patient needs continued PT services    Follow Up Recommendations  Home health PT;Supervision - Intermittent    Does the patient have the potential to tolerate intense rehabilitation      Barriers to Discharge None      Equipment Recommendations  None recommended by PT    Recommendations for Other Services     Frequency 7X/week    Precautions / Restrictions Precautions Precautions: Fall Restrictions Weight Bearing Restrictions: Yes RLE Weight Bearing: Partial weight bearing RLE Partial Weight Bearing Percentage or Pounds: 50   Pertinent Vitals/Pain 2/10 pain in knee.  No intervention required per pt.       Mobility  Bed Mobility Bed Mobility: Supine to Sit Supine to Sit: 5: Supervision Details for Bed Mobility Assistance: cues for technique. Transfers Transfers: Sit to Stand;Stand to Sit Sit to Stand: 5: Supervision Stand to Sit: 4: Min guard;To chair/3-in-1;With upper extremity assist Details for Transfer Assistance: Verbal cues for technique.   Ambulation/Gait Ambulation/Gait Assistance: 5: Supervision Ambulation Distance (Feet): 40 Feet Assistive device: Rolling walker Ambulation/Gait Assistance Details: Verbal cues for gait sequencing and PWB technique.   Gait Pattern: Step-to pattern Stairs: No    Shoulder Instructions     Exercises     PT Diagnosis: Difficulty walking;Acute pain;Generalized weakness  PT Problem List: Decreased range of motion;Decreased activity tolerance;Decreased strength;Decreased mobility;Decreased knowledge of use of  DME;Pain;Decreased knowledge of precautions PT Treatment Interventions: Gait training;Functional mobility training;Therapeutic activities;Therapeutic exercise;Patient/family education;DME instruction   PT Goals Acute Rehab PT Goals PT Goal Formulation: With patient Time For Goal Achievement: 11/16/12 Potential to Achieve Goals: Good Pt will go Supine/Side to Sit: Independently;with HOB 0 degrees PT Goal: Supine/Side to Sit - Progress: Goal set today Pt will go Sit to Supine/Side: Independently;with HOB 0 degrees PT Goal: Sit to Supine/Side - Progress: Goal set today Pt will go Sit to Stand: with modified independence PT Goal: Sit to Stand - Progress: Goal set today Pt will go Stand to Sit: with modified independence PT Goal: Stand to Sit - Progress: Goal set today Pt will Ambulate: 51 - 150 feet;with modified independence;with rolling walker PT Goal: Ambulate - Progress: Goal set today Pt will Perform Home Exercise Program: Independently PT Goal: Perform Home Exercise Program - Progress: Goal set today  Visit Information  Last PT Received On: 11/09/12 Assistance Needed: +1    Subjective Data  Subjective: agree to PT eval   Prior Functioning  Home Living Lives With: Spouse Available Help at Discharge: Family;Available 24 hours/day Type of Home: House Home Access: Level entry Home Layout: One level Bathroom Shower/Tub: Walk-in Contractor: Handicapped height Home Adaptive Equipment: Bedside commode/3-in-1;Walker - rolling Prior Function Level of Independence: Independent with assistive device(s) Able to Take Stairs?: Yes Driving: Yes Vocation: On disability Communication Communication: No difficulties    Cognition  Overall Cognitive Status: Appears within functional limits for tasks assessed/performed Arousal/Alertness: Awake/alert Orientation Level: Appears intact for tasks assessed Behavior During Session: [facility] for tasks performed      Extremity/Trunk Assessment Right Upper Extremity Assessment RUE ROM/Strength/Tone: Within functional levels  Left Upper Extremity Assessment LUE ROM/Strength/Tone: Within functional levels Right Lower Extremity Assessment RLE ROM/Strength/Tone: Unable to fully assess Left Lower Extremity Assessment LLE ROM/Strength/Tone: Within functional levels   Balance Balance Balance Assessed: No  End of Session PT - End of Session Equipment Utilized During Treatment: Gait belt Activity Tolerance: Patient tolerated treatment well Patient left: in chair;with call bell/phone within reach;with family/visitor present Nurse Communication: Mobility status CPM Right Knee CPM Right Knee: Off  GP     Tylek Boney 11/09/2012, 10:12 AM  Theron Arista L. Amed Datta DPT [phone]

## 2012-11-09 NOTE — Op Note (Signed)
Margaret [name], Margaret [name]              ACCOUNT NO.:  [id number]  MEDICAL RECORD NO.:  [id number]  LOCATION:  5N32C                        FACILITY:  [facility]  PHYSICIAN:  Claude Manges. Gaynor Ferreras, M.D.DATE OF BIRTH:  21-Jan-1961  DATE OF PROCEDURE: DATE OF DISCHARGE:                              OPERATIVE REPORT   PREOPERATIVE DIAGNOSIS:  Painful loosened right total knee replacement.  POSTOPERATIVE DIAGNOSIS:  Painful loosened right total knee replacement.  PROCEDURE:  Removal of right total knee replacement including femoral and tibial components, and reinsertion of revision arthroplasty components.  SURGEON:  Claude Manges. Cleophas Dunker, MD  ASSISTANT:  Oris Drone. Petrarca, P.A.-C, was present throughout the operative procedure to ensure its time of completion.  ANESTHESIA:  Femoral nerve block with general orotracheal.  COMPLICATIONS:  None.  COMPONENTS:  I removed an Osteonics right total knee replacement, cleaned the femur and tibia, left with polyethylene patellar button in place.  I inserted a DePuy MBT revision size 4 cemented rotating platform, tibial tray with a 75 mm x 12 mm fluted stem, and a step wedge medially 10 mm in height.  I used a #3 PFC Sigma femoral component with a 75 mm x 16 mm fluted stem, and 4 mm augment medially and laterally, posteriorly and anteriorly.  PROCEDURE IN DETAIL:  Ms. Mcgurn was met in the holding area, identified the right knee as appropriate operative site.  Did receive a preoperative femoral nerve block by Anesthesia.  Any questions were answered.  She was then transported to room #7 and placed under general orotracheal anesthesia without difficulty.  Nursing staff inserted a Foley catheter.  Urine was clear.  The left right lower extremity was then placed in a thigh tourniquet. The leg was prepped with chlorhexidine and then DuraPrep from the tourniquet to the tips of the toes.  Sterile draping was performed. Time-out was called.  Leg was then  elevated and an Esmarch exsanguinated with a proximal tourniquet at 350 mmHg.  The prior midline longitudinal incision was utilized and via sharp dissection, carried down to subcutaneous tissue.  There is abundant adipose tissue.  The patient had a BMI of approximately 40.  The medial parapatellar incision was then made with the Bovie through the deep capsule.  I did not find any old nonabsorbable sutures.  The joint was then entered.  There was a clear yellow joint effusion, this was sent both anaerobic and aerobic cultures.  The medial lateral dissection about the tibia, I was able to evert the patella 180 degrees laterally and flexed the knee 90 degrees.  There appeared to be a synovitis consistent with polyethylene wear debris.  It did not look like it was infected.  The preoperative sed rate and C reactive protein were normal, and there was increased uptake by bone scan, the tibial component only on the late phase.  Nonetheless, I did take deep synovial specimens from both the tibia and the femur, sent into Pathology and the report was that there were less than 10 white cells per high-powered field, in both specimens.  We then proceeded with the removal of the old components and insertion of new components.  I used the oscillating saw with a small blade  to remove the femur and once it was obviously loosened, I was able to remove it with little if any bone loss.  Retractors were then placed about the tibia in the same fashion.  I used a combination of the oscillating saw beneath the tibial component, and the osteotomes and removed it with no loss of bone.  The femoral component had not been cemented, the tibial component had been.  I removed any methacrylate from the proximal tibia.  There was a large bone cyst along the medial tibial plateau that was probably at least an inch in depth.  It had bony margins circumferentially.  I removed the abnormal soft tissue, and I  suspect that was the cause of the patient's pain and also the obvious loosening.  Center hole was then made with the reamer through the central tibia.  I obtained x-ray both AP and lateral projections to be sure that I was centered.  I then proceeded with a cleanup cut transversely in the proximal tibia, and then made cut to accept a 10 mm wedge medially based on the cyst.  There was still some area of bony void medially and I subsequently fill this with bone graft from the femoral cut.  A #4 tibial tray fit perfectly.  I used a trial 75 mm fluted stem that was 12 mm in diameter after reaming to about 13 mm.  I used the proximal reamer to accept the wider diameter proximal fluted stem in the MBT stem.  I checked my alignment on multiple occasions, I felt that it was perfectly aligned and had very nice apposition of the tibial component to the proximal tibia.  Retractors were then placed about the femur.  I measured a #3 segment of femoral component and that proceeded reaming centrally in the femoral canal, so that I could make my bony cuts based on the inner canal reamer.  Subsequent cuts were then made posteriorly and anteriorly.  I needed a 4 mm wedges, both distally and posteriorly.  The trial femoral component was then inserted with the 75 mm fluted stem.  I had reamed to 16 mm and used the 16 mm and the stem and then applied the trial augments by excellent fit and then with the tibial tray in place, and the femoral component in place, I inserted the 10-mm polyethylene component.  I had full extension, no opening with varus or valgus stress.  Negative anterior drawer sign.  The alignment was excellent.  At that point, we were close to 2 hours on the tourniquet, so I removed the trial components and then released the tourniquet.  Gross bleeders were Bovie coagulated.  Then I packed the wound with sterile gauze. While waiting for adequate time off the tourniquet, we assembled  the final components on the side table.  This included the size 4 tibial tray with the 75 mm x 12 mm fluted stem and a then the 10 mm wedge medially and then the size 3 femur with a 75 x 16 mm fluted stem, and 4 mm augments distally and posteriorly.  The wound at that point, was copiously irrigated with saline solution. Any further bleeding was controlled with compression.  At approximately 18 minutes, I then elevated the leg and then used an Esmarch and reinflated the tourniquet to 350 mmHg.  The wound was again irrigated with saline solution.  I placed several drill holes in the base of the medial tibial plateau cyst and then inserted cancellous bone grafts with impaction,  so that was nearly flushed with the cut surface. We assembled, the tibial component was then impacted with polymethyl methacrylate with gentamicin antibiotic.  This was impacted with very nice fit and extraneous methacrylate was removed from the periphery of the components.  The femur was then inserted as assembled with the same methacrylate with gentamicin included, had a very nice compression and very nice fit on the femur.  I inserted the trial 10 mm bridging bearing.  The knee was placed in full extension.  There was no opening with varus or valgus stress at 30 degrees of flexion, and any further extraneous methacrylate was removed.  While awaiting for the methacrylate to mature, I irrigated the wound and then infiltrated the deep capsule with 0.25% Marcaine with epinephrine.  We had evaluated the polyethylene patellar button.  It did look like there was very little wear and I was concerned about damaging the patella and therefore left it in place.  There was very little synovitis about the patella and there was no evidence that it was loose body film or by bone scan or even symptomatically.  Approximately 16 minutes, the methacrylate to mature, the joint was then explored any further extraneous  methacrylate was removed with an osteotome.  The wound was again irrigated with saline solution.  I then inserted the tibial polyethylene 10 mm stabilized rotating platform polyethylene component without difficulty and again through the full range of motion, there was no malrotation of the tibia, did not open up with varus or valgus stress.  Negative anterior drawer sign.  We had full extension, and no lift off with inflection of the tibial component. Tourniquet was then released at approximately 31 minutes.  Wound was again irrigated with solution.  Any bleeding was controlled with the Bovie.  I did spray thrombin spray about the wound until we had nice hemostasis, a medium-size Hemovac was inserted.  The wound was then closed in anatomic layers.  The deep capsule closed with an interrupted #1 Ethibond, superficial capsule with 0 Vicryl, subcu with 3-0 Monocryl.  Skin closed with skin clips.  Sterile bulky dressing was applied followed by the patient's support stocking.  The patient tolerated procedure well without complications.     Claude Manges. Cleophas Dunker, M.D.     PWW/MEDQ  D:  11/08/2012  T:  11/09/2012  Job:  [id number]

## 2012-11-09 NOTE — Progress Notes (Signed)
Spoke with pt about OT services.  Pt has had a total knee revision and is very aware of all adl techniques and adl transfer techniques.  Pt has all necessary DME.  Will cancel this OT eval per pt request. Tory Emerald, OTR/L [phone]

## 2012-11-10 DIAGNOSIS — T84012A Broken internal right knee prosthesis, initial encounter: Secondary | ICD-10-CM

## 2012-11-10 LAB — CBC
HCT: 32.7 % — ABNORMAL LOW (ref 36.0–46.0)
Hemoglobin: 11 g/dL — ABNORMAL LOW (ref 12.0–15.0)
MCH: 28.9 pg (ref 26.0–34.0)
MCHC: 33.6 g/dL (ref 30.0–36.0)
MCV: 85.8 fL (ref 78.0–100.0)
Platelets: 202 10*3/uL (ref 150–400)
RBC: 3.81 MIL/uL — ABNORMAL LOW (ref 3.87–5.11)
RDW: 12.5 % (ref 11.5–15.5)
WBC: 9.9 10*3/uL (ref 4.0–10.5)

## 2012-11-10 LAB — BASIC METABOLIC PANEL
BUN: 5 mg/dL — ABNORMAL LOW (ref 6–23)
CO2: 26 mEq/L (ref 19–32)
Calcium: 8.7 mg/dL (ref 8.4–10.5)
Chloride: 99 mEq/L (ref 96–112)
Creatinine, Ser: 0.36 mg/dL — ABNORMAL LOW (ref 0.50–1.10)
GFR calc Af Amer: >90 mL/min (ref >90)
GFR calc non Af Amer: >90 mL/min (ref >90)
Glucose, Bld: 144 mg/dL — ABNORMAL HIGH (ref 70–99)
Potassium: 3.2 mEq/L — ABNORMAL LOW (ref 3.5–5.1)
Sodium: 136 mEq/L (ref 135–145)

## 2012-11-10 MED ORDER — POTASSIUM CHLORIDE CRYS ER 20 MEQ PO TBCR
EXTENDED_RELEASE_TABLET | ORAL | Status: AC
  Filled 2012-11-10: qty 2

## 2012-11-10 MED ORDER — OXYCODONE-ACETAMINOPHEN 5-325 MG PO TABS
1.0000 | ORAL_TABLET | ORAL | Status: DC | PRN
  Administered 2012-11-10 (×2): 1 via ORAL
  Administered 2012-11-10: 2 via ORAL
  Filled 2012-11-10: qty 1
  Filled 2012-11-10: qty 2
  Filled 2012-11-10: qty 1

## 2012-11-10 MED ORDER — OXYCODONE HCL ER 10 MG PO T12A
20.0000 mg | EXTENDED_RELEASE_TABLET | Freq: Two times a day (BID) | ORAL | Status: DC
  Administered 2012-11-10: 20 mg via ORAL
  Filled 2012-11-10: qty 2

## 2012-11-10 MED ORDER — OXYCODONE HCL ER 20 MG PO T12A: 20.0000 mg | EXTENDED_RELEASE_TABLET | Freq: Two times a day (BID) | ORAL | Status: DC

## 2012-11-10 MED ORDER — POTASSIUM CHLORIDE CRYS ER 20 MEQ PO TBCR
40.0000 meq | EXTENDED_RELEASE_TABLET | Freq: Once | ORAL | Status: AC
  Administered 2012-11-10: 40 meq via ORAL

## 2012-11-10 MED ORDER — OXYCODONE-ACETAMINOPHEN 5-325 MG PO TABS: 1.0000 | ORAL_TABLET | ORAL | Status: DC | PRN

## 2012-11-10 MED ORDER — RIVAROXABAN 10 MG PO TABS: 10.0000 mg | ORAL_TABLET | Freq: Every day | ORAL | Status: DC

## 2012-11-10 MED ORDER — METHOCARBAMOL 500 MG PO TABS: 500.0000 mg | ORAL_TABLET | Freq: Four times a day (QID) | ORAL | Status: DC | PRN

## 2012-11-10 NOTE — Progress Notes (Signed)
Patient provided with discharge instructions and follow up information. She has no complaints at this time and is to be discharged home in stable condition with her husband. Home Health PT will follow up.

## 2012-11-10 NOTE — Progress Notes (Signed)
Physical Therapy Treatment Patient Details Name: Margaret [name] MRN: [id number] DOB: Jul 05, 1961 Today's Date: 11/10/2012 Time: 4098-1191 PT Time Calculation (min): 32 min  PT Assessment / Plan / Recommendation Comments on Treatment Session  Pt progressing well, increased ambulation distance with less support. Possible d/c today vs tomorrow with HHPT    Follow Up Recommendations  Home health PT;Supervision - Intermittent     Does the patient have the potential to tolerate intense rehabilitation     Barriers to Discharge        Equipment Recommendations  None recommended by PT    Recommendations for Other Services    Frequency 7X/week   Plan Discharge plan remains appropriate;Frequency remains appropriate    Precautions / Restrictions Precautions Precautions: Fall Restrictions Weight Bearing Restrictions: Yes RLE Weight Bearing: Partial weight bearing RLE Partial Weight Bearing Percentage or Pounds: 50   Pertinent Vitals/Pain Pain in knee 3/10. Pain meds given prior to session.     Mobility  Bed Mobility Bed Mobility: Sit to Supine Sit to Supine: 4: Min assist Details for Bed Mobility Assistance: Assist with RLE. Cues for safe technique Transfers Transfers: Sit to Stand;Stand to Sit Sit to Stand: 5: Supervision;With upper extremity assist;From chair/3-in-1 Stand to Sit: 5: Supervision;With upper extremity assist;To bed Details for Transfer Assistance: Supervision for safety. Cues for safe hand placement Ambulation/Gait Ambulation/Gait Assistance: 5: Supervision Ambulation Distance (Feet): 150 Feet Assistive device: Rolling walker Ambulation/Gait Assistance Details: Cues for safe technique with RW. Pt able to maintain PWB safely using UE assist on RW Gait Pattern: Step-to pattern;Decreased stance time - right;Decreased step length - left;Decreased hip/knee flexion - right Gait velocity: slow Stairs: No    Exercises Total Joint Exercises Heel Slides:  AAROM;Strengthening;Right;10 reps;Seated Straight Leg Raises: AROM;Strengthening;Right;10 reps;Supine Long Arc Quad: AROM;Strengthening;Right;10 reps;Seated Goniometric ROM: 0-95   PT Diagnosis:    PT Problem List:   PT Treatment Interventions:     PT Goals Acute Rehab PT Goals PT Goal: Supine/Side to Sit - Progress: Progressing toward goal PT Goal: Sit to Supine/Side - Progress: Progressing toward goal PT Goal: Sit to Stand - Progress: Progressing toward goal PT Goal: Stand to Sit - Progress: Progressing toward goal PT Goal: Ambulate - Progress: Progressing toward goal PT Goal: Perform Home Exercise Program - Progress: Progressing toward goal  Visit Information  Last PT Received On: 11/10/12 Assistance Needed: +1    Subjective Data  Subjective: pt states feeling much better this afternoon, extreme pain this AM   Cognition  Overall Cognitive Status: Appears within functional limits for tasks assessed/performed Arousal/Alertness: Awake/alert Orientation Level: Appears intact for tasks assessed Behavior During Session: [facility] for tasks performed    Balance     End of Session PT - End of Session Equipment Utilized During Treatment: Gait belt Activity Tolerance: Patient tolerated treatment well Patient left: in bed;in CPM;with family/visitor present Nurse Communication: Mobility status CPM Right Knee CPM Right Knee: On Right Knee Flexion (Degrees): 65  Right Knee Extension (Degrees): 0    GP     Milana Kidney 11/10/2012, 2:28 PM

## 2012-11-10 NOTE — Progress Notes (Signed)
Patient ID: Margaret [name], female   DOB: 09-02-1961, 52 y.o.   MRN: [id number] PATIENT ID: Margaret [name]        MRN:  [id number]          DOB/AGE: 03/06/1961 / 52 y.o.    Norlene Campbell, MD   Jacqualine Code, PA-C [address] [place], [place]  [address]                             [phone]   PROGRESS NOTE  Subjective:  negative for Chest Pain  negative for Shortness of Breath  negative for Nausea/Vomiting   negative for Calf Pain    Tolerating Diet: yes         Patient reports pain as mild and moderate.     Pain improved on oxycontin and oxycodone.  Wants to go home  Objective: Vital signs in last 24 hours:   Patient Vitals for the past 24 hrs:  BP Temp Temp src Pulse Resp SpO2 Height Weight  11/10/12 1600 - - - - 12  99 % - -  11/10/12 1401 111/43 mmHg - - - - - - -  11/10/12 1400 130/49 mmHg 98.4 F (36.9 C) Oral 67  16  99 % - -  11/10/12 1131 - - - - 13  98 % - -  11/10/12 0800 - - - - 13  99 % - -  11/09/12 2003 129/62 mmHg 99.4 F (37.4 C) Oral 76  18  99 % - -  11/09/12 2000 - - - - 18  99 % - -  11/09/12 1955 - - - - - - 5\' 6"  (1.676 m) 115.214 kg (254 lb)      Intake/Output from previous day:   01/22 0701 - 01/23 0700 In: 720 [P.O.:720] Out: 175 [Drains:175]   Intake/Output this shift:   01/23 0701 - 01/23 1900 In: -  Out: 1 [Urine:1]   Intake/Output      01/22 0701 - 01/23 0700 01/23 0701 - 01/24 0700   P.O. 720    I.V. (mL/kg)     IV Piggyback     Total Intake(mL/kg) 720 (6.2)    Urine (mL/kg/hr)  1 (0)   Drains 175    Blood     Total Output 175 1   Net +545 -1        Urine Occurrence 3 x       LABORATORY DATA:  Basename 11/10/12 0625 11/09/12 0520  WBC 9.9 7.9  HGB 11.0* 10.8*  HCT 32.7* 31.5*  PLT 202 186    Basename 11/10/12 0625 11/09/12 0520  NA 136 137  K 3.2* 3.5  CL 99 103  CO2 26 28  BUN 5* 11  CREATININE 0.36* 0.47*  GLUCOSE 144* 127*  CALCIUM 8.7 8.3*   Lab Results  Component Value Date   INR 0.99  11/02/2012   INR 1.03 03/13/2011   INR 1.0 08/16/2008    Recent Radiographic Studies :   Chest 2 View  11/02/2012  *RADIOLOGY REPORT*  Clinical Data: Preop.  CHEST - 2 VIEW  Comparison: 03/13/2011.  Findings: Trachea is midline.  Heart size normal.  Lungs are clear. No pleural fluid.  IMPRESSION: No acute findings.   Original Report Authenticated By: Leanna Battles, M.D.    Dg Knee Right Port  11/08/2012  *RADIOLOGY REPORT*  Clinical Data: Right knee revision  PORTABLE RIGHT KNEE - 1-2 VIEW  Comparison: None  Findings: Two operative views were obtained of the proximal tibia. The knee joint is not fully imaged.  Intramedullary metal marker is seen in the proximal tibia.  Osteotomy of the proximal tibia.  IMPRESSION: Surgical localization.   Original Report Authenticated By: Janeece Riggers, M.D.      Examination:  General appearance: alert, cooperative, mild distress and moderate distress  Wound Exam: clean, dry, intact   Drainage:  None: wound tissue dry  Motor Exam: EHL, FHL, Anterior Tibial and Posterior Tibial Intact  Sensory Exam: Superficial Peroneal, Deep Peroneal and Tibial normal  Vascular Exam: Right posterior tibial artery has 1+ (weak) pulse  Assessment:    2 Days Post-Op  Procedure(s) (LRB): TOTAL KNEE REVISION (Right)  ADDITIONAL DIAGNOSIS:  Principal Problem:  *Failed total right knee replacement Active Problems:  Obesity, Class III, BMI 40-49.9 (morbid obesity)  Acute Blood Loss Anemia asymptomatic   Plan: Physical Therapy as ordered Partial Weight Bearing @ 50% (PWB)  DVT Prophylaxis:  Xarelto, Foot Pumps and TED hose  DISCHARGE PLAN: Home  DISCHARGE NEEDS: HHPT, CPM, Walker and 3-in-1 comode seat         PETRARCA,BRIAN 11/10/2012 6:09 PM

## 2012-11-10 NOTE — Discharge Summary (Signed)
Margaret Campbell, MD   Margaret Code, PA-C [address], [place], [place]  [address]                             [phone]  PATIENT ID: Margaret [name]        MRN:  [id number]          DOB/AGE: [birth year] / 52 y.o.    DISCHARGE SUMMARY  ADMISSION DATE:    11/08/2012 DISCHARGE DATE:   11/10/2012   ADMISSION DIAGNOSIS: Painful loose Right Total Knee Arthroplasty    DISCHARGE DIAGNOSIS:  Painful loose Right Total Knee Arthroplasty    ADDITIONAL DIAGNOSIS: Principal Problem:  *Failed total right knee replacement Active Problems:  Obesity, Class III, BMI 40-49.9 (morbid obesity)  Past Medical History  Diagnosis Date  . Total knee replacement status   . Thyroid disease   . Hypothyroidism   . Mental disorder     perimenopause use of Paxil  . Arthritis     knees   . Hypertension     on meds, reviewed by Dr. Jacinto Halim for cardiac clearance - 09/2013, told that she was cleared & no need to follow up with him     PROCEDURE: Procedure(s): TOTAL KNEE REVISION Righton 11/08/2012  CONSULTS: none     HISTORY: Margaret [name] is a very pleasant 52 year old white female who is seen today for evaluation of her right knee. She, in December 2002, had a right total knee arthroplasty performed by Dr. Eulah Pont and I was the assistant at that time. She had done well until April 2013 when she started developing pain in the right total knee. It was gradual and then all of a sudden it worsened to the point where she has been having this intermittent moderate and severe throbbing pain with some sharpness to it. They would wake her at night time and worsen with activities. She had tried conservative treatment in the past, but it did not help. She was seen on 02/24/2012 and at that time laboratory studies were obtained as well as an aspiration of the knee. Cultures did not show anything. Her CBC diff, CMET, sed rate and CRP were normal. A three phase bone scan revealed loosening of the right tibial plate. She had  started having problems even with activities of daily living and had worsened. She did lose her father last year and was having to take care of her dad at that time and she continues now to take care of her mother. She was not able to consider having any type of surgery until now. The pain has become almost unbearable. She does use a brace, which does give her some support. The pain has worsened and now she is having pain with every step   HOSPITAL COURSE:  Margaret [name] is a 52 y.o. admitted on 11/08/2012 and found to have a diagnosis of Painful loose Right Total Knee Arthroplasty.  After appropriate laboratory studies were obtained  they were taken to the operating room on 11/08/2012 and underwent  Procedure(s): TOTAL KNEE REVISION Right.   They were given perioperative antibiotics:  Anti-infectives     Start     Dose/Rate Route Frequency Ordered Stop   11/09/12 0815   ceFAZolin (ANCEF) IVPB 2 g/50 mL premix     Comments: This is a revision TKR and want to continue IV antibx for 48 hours      2 g 100 mL/hr over 30 Minutes Intravenous Every  6 hours Nov 28, 2012 0807 11/10/12 1450   11/08/12 1800   ceFAZolin (ANCEF) IVPB 2 g/50 mL premix        2 g 100 mL/hr over 30 Minutes Intravenous Every 6 hours 11/08/12 1809 Nov 28, 2012 0204   11/08/12 0600   ceFAZolin (ANCEF) IVPB 2 g/50 mL premix        2 g 100 mL/hr over 30 Minutes Intravenous On call to O.R. 11/07/12 1425 11/08/12 1052        .  Tolerated the procedure well.  Placed with a foley intraoperatively.  Given Ofirmev at induction and for 48 hours.   Toradol was given post op.  POD #1, allowed out of bed to a chair.  PT for ambulation and exercise program.  Foley D/C'd in morning.  IV saline locked.  O2 discontionued.  POD #2, continued PT and ambulation.  Hemovac pulled. HAD SOME ISSUES WITH PAIN CONTROL BUT CHANGED TO OXYCONTIN AND PERCOCET WITH IMPROVEMENT  The remainder of the hospital course was dedicated to ambulation and  strengthening.   The patient was discharged on 2 Days Post-Op in  Stable condition.  Blood products given:none  DIAGNOSTIC STUDIES: Recent vital signs: Patient Vitals for the past 24 hrs:  BP Temp Temp src Pulse Resp SpO2 Height Weight  11/10/12 1600 - - - - 12  99 % - -  11/10/12 1401 111/43 mmHg - - - - - - -  11/10/12 1400 130/49 mmHg 98.4 F (36.9 C) Oral 67  16  99 % - -  11/10/12 1131 - - - - 13  98 % - -  11/10/12 0800 - - - - 13  99 % - -  11/28/2012 2003 129/62 mmHg 99.4 F (37.4 C) Oral 76  18  99 % - -  28-Nov-2012 2000 - - - - 18  99 % - -  2012-11-28 1955 - - - - - - 5\' 6"  (1.676 m) 115.214 kg (254 lb)       Recent laboratory studies:  Basename 11/10/12 0625 11/28/2012 0520  WBC 9.9 7.9  HGB 11.0* 10.8*  HCT 32.7* 31.5*  PLT 202 186    Basename 11/10/12 0625 11/28/2012 0520  NA 136 137  K 3.2* 3.5  CL 99 103  CO2 26 28  BUN 5* 11  CREATININE 0.36* 0.47*  GLUCOSE 144* 127*  CALCIUM 8.7 8.3*   Lab Results  Component Value Date   INR 0.99 11/02/2012   INR 1.03 03/13/2011   INR 1.0 08/16/2008     Recent Radiographic Studies :   Chest 2 View  11/02/2012  *RADIOLOGY REPORT*  Clinical Data: Preop.  CHEST - 2 VIEW  Comparison: 03/13/2011.  Findings: Trachea is midline.  Heart size normal.  Lungs are clear. No pleural fluid.  IMPRESSION: No acute findings.   Original Report Authenticated By: Leanna Battles, M.D.    Dg Knee Right Port  11/08/2012  *RADIOLOGY REPORT*  Clinical Data: Right knee revision  PORTABLE RIGHT KNEE - 1-2 VIEW  Comparison: None  Findings: Two operative views were obtained of the proximal tibia. The knee joint is not fully imaged.  Intramedullary metal marker is seen in the proximal tibia.  Osteotomy of the proximal tibia.  IMPRESSION: Surgical localization.   Original Report Authenticated By: Janeece Riggers, M.D.     DISCHARGE INSTRUCTIONS: Discharge Orders    Future Orders Please Complete By Expires   Diet general      Call MD / Call 911  Comments:   If you experience chest pain or shortness of breath, CALL 911 and be transported to the hospital emergency room.  If you develope a fever above 101 F, pus (white drainage) or increased drainage or redness at the wound, or calf pain, call your surgeon's office.   Constipation Prevention      Comments:   Drink plenty of fluids.  Prune juice and/or coffee may be helpful.  You may use a stool softener, such as Colace (over the counter) 100 mg twice a day.  Use MiraLax (over the counter) for constipation as needed but this may take several days to work.  Mag Citrate --OR-- Milk of Magnesia may also be used but follow directions on the label.   Increase activity slowly as tolerated      Patient may shower      Comments:   You may shower over the brown dressing.  Once the dressing is removed you may shower without a dressing once there is no drainage.  Do not wash over the wound.  If drainage remains, cover wound with plastic wrap and then shower.   Partial weight bearing      Comments:   50 % WEIGHT BEARING AS TAUGHT IN PHYSICAL THERAPY   Driving restrictions      Comments:   No driving for 6 weeks   Lifting restrictions      Comments:   No lifting for 6 weeks   CPM      Comments:   Continuous passive motion machine (CPM):      Use the CPM from 0 to 60 for 6-8 hours per day.      You may increase by 5-10 per day.  You may break it up into 2 or 3 sessions per day.      Use CPM for 3-4 weeks or until you are told to stop.   TED hose      Comments:   Use stockings (TED hose) for 2 weeks on operative leg(s).  You may remove them at night for sleeping.   Change dressing      Comments:   Change dressing on SUNDAY, then change the dressing daily with sterile 4 x 4 inch gauze dressing and apply TED hose.  You may clean the incision with alcohol prior to redressing.   Do not put a pillow under the knee. Place it under the heel.         DISCHARGE MEDICATIONS:     Medication List      As of 11/10/2012  6:19 PM    STOP taking these medications         celecoxib 200 MG capsule   Commonly known as: CELEBREX      diclofenac sodium 1 % Gel   Commonly known as: VOLTAREN      HYDROcodone-acetaminophen 5-325 MG per tablet   Commonly known as: NORCO/VICODIN      ibuprofen 200 MG tablet   Commonly known as: ADVIL,MOTRIN      TAKE these medications         atorvastatin 40 MG tablet   Commonly known as: LIPITOR   Take 40 mg by mouth at bedtime.      diphenhydrAMINE 25 MG tablet   Commonly known as: SOMINEX   Take 25 mg by mouth daily as needed. sleep      levothyroxine 200 MCG tablet   Commonly known as: SYNTHROID, LEVOTHROID   Take 200 mcg by mouth daily before breakfast.  lisinopril-hydrochlorothiazide 20-25 MG per tablet   Commonly known as: PRINZIDE,ZESTORETIC   Take 1 tablet by mouth daily before breakfast.      methocarbamol 500 MG tablet   Commonly known as: ROBAXIN   Take 1 tablet (500 mg total) by mouth every 6 (six) hours as needed (spasms).      OxyCODONE 20 mg T12a   Commonly known as: OXYCONTIN   Take 1 tablet (20 mg total) by mouth every 12 (twelve) hours.      oxyCODONE-acetaminophen 5-325 MG per tablet   Commonly known as: PERCOCET/ROXICET   Take 1-2 tablets by mouth every 4 (four) hours as needed for pain (For breakthrough pain).      PARoxetine 10 MG tablet   Commonly known as: PAXIL   Take 10 mg by mouth at bedtime.      rivaroxaban 10 MG Tabs tablet   Commonly known as: XARELTO   Take 1 tablet (10 mg total) by mouth daily with breakfast.      verapamil 120 MG tablet   Commonly known as: CALAN   Take 120 mg by mouth daily with breakfast.        FOLLOW UP VISIT:       Follow-up Information    Follow up with [facility] Call on 11/23/2012.   [place] information:   [address] [address] [phone]          DISPOSITION:   HOME  CONDITION:  Stable  Lamar Meter 11/10/2012, 6:19 PM

## 2012-11-10 NOTE — Progress Notes (Signed)
CARE MANAGEMENT NOTE 11/10/2012  Patient:  Margaret [name], Margaret [name]   Account Number:  [id number]  Date Initiated:  11/10/2012  Documentation initiated by:  Vance Peper  Subjective/Objective Assessment:   52 yr old female s/p right total knee arthroplasty     Action/Plan:   Patient preoperatively setup with [facility], no changes. DME has been delivered to her home.   Anticipated DC Date:  11/11/2012   Anticipated DC Plan:  HOME W HOME HEALTH SERVICES      DC Planning Services  CM consult      [facility] Choice  HOME HEALTH   Choice offered to / List presented to:  C-1 Patient        HH arranged  HH-2 PT      [facility] agency  [facility]   Status of service:  Completed, signed off Medicare Important Message given?   (If response is "NO", the following Medicare IM given date fields will be blank) Date Medicare IM given:   Date Additional Medicare IM given:    Discharge Disposition:  HOME W HOME HEALTH SERVICES  Per UR Regulation:    If discussed at Long Length of Stay Meetings, dates discussed:    Comments:

## 2012-11-11 ENCOUNTER — Encounter (HOSPITAL_COMMUNITY): Payer: Self-pay | Admitting: Orthopaedic Surgery

## 2012-11-11 LAB — BODY FLUID CULTURE: Culture: NO GROWTH

## 2012-11-11 NOTE — Progress Notes (Signed)
Late entry:  PT is recommending home with [facility] and not SNF. Clinical Social Worker will sign off for now as social work intervention is no longer needed. Please consult [place] again if new need arises.   Sabino Niemann, MSW [phone]

## 2012-11-13 LAB — ANAEROBIC CULTURE

## 2012-12-03 ENCOUNTER — Other Ambulatory Visit: Payer: Self-pay

## 2012-12-15 ENCOUNTER — Ambulatory Visit: Payer: Medicare Other | Attending: Orthopaedic Surgery | Admitting: Rehabilitation

## 2012-12-15 DIAGNOSIS — IMO0001 Reserved for inherently not codable concepts without codable children: Secondary | ICD-10-CM | POA: Insufficient documentation

## 2012-12-15 DIAGNOSIS — M25569 Pain in unspecified knee: Secondary | ICD-10-CM | POA: Insufficient documentation

## 2012-12-15 DIAGNOSIS — R269 Unspecified abnormalities of gait and mobility: Secondary | ICD-10-CM | POA: Insufficient documentation

## 2012-12-15 DIAGNOSIS — Z96659 Presence of unspecified artificial knee joint: Secondary | ICD-10-CM | POA: Insufficient documentation

## 2012-12-20 ENCOUNTER — Ambulatory Visit: Payer: Medicare Other | Attending: Orthopaedic Surgery | Admitting: Physical Therapy

## 2012-12-20 DIAGNOSIS — M25569 Pain in unspecified knee: Secondary | ICD-10-CM | POA: Insufficient documentation

## 2012-12-20 DIAGNOSIS — IMO0001 Reserved for inherently not codable concepts without codable children: Secondary | ICD-10-CM | POA: Insufficient documentation

## 2012-12-20 DIAGNOSIS — R269 Unspecified abnormalities of gait and mobility: Secondary | ICD-10-CM | POA: Insufficient documentation

## 2012-12-20 DIAGNOSIS — Z96659 Presence of unspecified artificial knee joint: Secondary | ICD-10-CM | POA: Insufficient documentation

## 2012-12-22 ENCOUNTER — Ambulatory Visit: Payer: Medicare Other | Admitting: Physical Therapy

## 2012-12-27 ENCOUNTER — Ambulatory Visit: Payer: Medicare Other | Admitting: Physical Therapy

## 2012-12-29 ENCOUNTER — Ambulatory Visit: Payer: Medicare Other | Admitting: Physical Therapy

## 2013-01-03 ENCOUNTER — Ambulatory Visit: Payer: Medicare Other | Admitting: Physical Therapy

## 2013-01-05 ENCOUNTER — Ambulatory Visit: Payer: Medicare Other | Admitting: Physical Therapy

## 2013-01-12 ENCOUNTER — Ambulatory Visit: Payer: Medicare Other | Admitting: Physical Therapy

## 2013-01-19 ENCOUNTER — Ambulatory Visit: Payer: Medicare Other | Attending: Orthopaedic Surgery | Admitting: Physical Therapy

## 2013-01-19 DIAGNOSIS — Z96659 Presence of unspecified artificial knee joint: Secondary | ICD-10-CM | POA: Insufficient documentation

## 2013-01-19 DIAGNOSIS — IMO0001 Reserved for inherently not codable concepts without codable children: Secondary | ICD-10-CM | POA: Insufficient documentation

## 2013-01-19 DIAGNOSIS — R269 Unspecified abnormalities of gait and mobility: Secondary | ICD-10-CM | POA: Insufficient documentation

## 2013-01-19 DIAGNOSIS — M25569 Pain in unspecified knee: Secondary | ICD-10-CM | POA: Insufficient documentation

## 2013-01-26 ENCOUNTER — Ambulatory Visit: Payer: Medicare Other | Admitting: Physical Therapy

## 2013-01-31 ENCOUNTER — Ambulatory Visit: Payer: Medicare Other | Admitting: Physical Therapy

## 2013-02-07 ENCOUNTER — Ambulatory Visit: Payer: Medicare Other | Admitting: Physical Therapy

## 2013-02-14 ENCOUNTER — Ambulatory Visit: Payer: Medicare Other | Admitting: Physical Therapy

## 2013-02-23 ENCOUNTER — Ambulatory Visit: Payer: Medicare Other | Admitting: Physical Therapy

## 2013-02-28 ENCOUNTER — Ambulatory Visit: Payer: Medicare Other | Attending: Orthopaedic Surgery | Admitting: Physical Therapy

## 2013-02-28 DIAGNOSIS — IMO0001 Reserved for inherently not codable concepts without codable children: Secondary | ICD-10-CM | POA: Insufficient documentation

## 2013-02-28 DIAGNOSIS — R269 Unspecified abnormalities of gait and mobility: Secondary | ICD-10-CM | POA: Insufficient documentation

## 2013-02-28 DIAGNOSIS — Z96659 Presence of unspecified artificial knee joint: Secondary | ICD-10-CM | POA: Insufficient documentation

## 2013-02-28 DIAGNOSIS — M25569 Pain in unspecified knee: Secondary | ICD-10-CM | POA: Insufficient documentation

## 2013-03-02 ENCOUNTER — Ambulatory Visit: Payer: Medicare Other | Admitting: Physical Therapy

## 2013-03-07 ENCOUNTER — Encounter: Payer: Medicare Other | Admitting: Physical Therapy

## 2013-03-09 ENCOUNTER — Ambulatory Visit: Payer: Medicare Other | Admitting: Physical Therapy

## 2013-03-10 ENCOUNTER — Ambulatory Visit: Payer: Medicare Other | Admitting: Physical Therapy

## 2013-03-14 ENCOUNTER — Ambulatory Visit: Payer: Medicare Other | Admitting: Physical Therapy

## 2013-03-15 ENCOUNTER — Encounter: Payer: Medicare Other | Admitting: Physical Therapy

## 2013-03-16 ENCOUNTER — Ambulatory Visit: Payer: Medicare Other | Admitting: Physical Therapy

## 2013-03-17 ENCOUNTER — Ambulatory Visit: Payer: Medicare Other | Admitting: Physical Therapy

## 2013-03-25 DIAGNOSIS — D72829 Elevated white blood cell count, unspecified: Secondary | ICD-10-CM | POA: Insufficient documentation

## 2013-03-25 DIAGNOSIS — R9431 Abnormal electrocardiogram [ECG] [EKG]: Secondary | ICD-10-CM | POA: Insufficient documentation

## 2013-08-24 ENCOUNTER — Other Ambulatory Visit: Payer: Self-pay

## 2013-11-22 DIAGNOSIS — M549 Dorsalgia, unspecified: Secondary | ICD-10-CM | POA: Insufficient documentation

## 2013-12-26 ENCOUNTER — Ambulatory Visit: Payer: Medicare Other | Attending: Orthopaedic Surgery | Admitting: Physical Therapy

## 2013-12-26 DIAGNOSIS — M545 Low back pain, unspecified: Secondary | ICD-10-CM | POA: Insufficient documentation

## 2013-12-26 DIAGNOSIS — IMO0001 Reserved for inherently not codable concepts without codable children: Secondary | ICD-10-CM | POA: Insufficient documentation

## 2014-01-02 ENCOUNTER — Ambulatory Visit: Payer: Medicare Other | Admitting: Physical Therapy

## 2014-01-10 ENCOUNTER — Ambulatory Visit: Payer: Medicare Other | Admitting: Physical Therapy

## 2014-01-29 ENCOUNTER — Other Ambulatory Visit: Payer: Self-pay | Admitting: Pain Medicine

## 2014-01-29 DIAGNOSIS — M545 Low back pain, unspecified: Secondary | ICD-10-CM

## 2014-01-31 ENCOUNTER — Ambulatory Visit
Admission: RE | Admit: 2014-01-31 | Discharge: 2014-01-31 | Disposition: A | Discharge status: Home / Self Care | Payer: Medicare Other | Source: Ambulatory Visit | Attending: Pain Medicine | Admitting: Pain Medicine

## 2014-01-31 DIAGNOSIS — M545 Low back pain, unspecified: Secondary | ICD-10-CM

## 2014-02-01 ENCOUNTER — Other Ambulatory Visit: Payer: Medicare Other

## 2014-08-20 ENCOUNTER — Encounter (HOSPITAL_COMMUNITY): Payer: Self-pay | Admitting: Orthopaedic Surgery

## 2015-03-08 ENCOUNTER — Encounter: Payer: Self-pay | Admitting: Physical Medicine & Rehabilitation

## 2015-03-26 ENCOUNTER — Other Ambulatory Visit: Payer: Self-pay | Admitting: Obstetrics and Gynecology

## 2015-04-16 ENCOUNTER — Encounter: Payer: Self-pay | Admitting: Physical Medicine & Rehabilitation

## 2015-04-16 ENCOUNTER — Encounter: Payer: PPO | Attending: Physical Medicine & Rehabilitation

## 2015-04-16 ENCOUNTER — Other Ambulatory Visit: Payer: Self-pay | Admitting: Physical Medicine & Rehabilitation

## 2015-04-16 ENCOUNTER — Ambulatory Visit (HOSPITAL_BASED_OUTPATIENT_CLINIC_OR_DEPARTMENT_OTHER): Payer: PPO | Admitting: Physical Medicine & Rehabilitation

## 2015-04-16 VITALS — BP 122/69 | HR 60 | Resp 14

## 2015-04-16 DIAGNOSIS — G894 Chronic pain syndrome: Secondary | ICD-10-CM | POA: Insufficient documentation

## 2015-04-16 DIAGNOSIS — T84092S Other mechanical complication of internal right knee prosthesis, sequela: Secondary | ICD-10-CM | POA: Insufficient documentation

## 2015-04-16 DIAGNOSIS — Z5181 Encounter for therapeutic drug level monitoring: Secondary | ICD-10-CM | POA: Diagnosis not present

## 2015-04-16 DIAGNOSIS — M47816 Spondylosis without myelopathy or radiculopathy, lumbar region: Secondary | ICD-10-CM

## 2015-04-16 DIAGNOSIS — Z79899 Other long term (current) drug therapy: Secondary | ICD-10-CM | POA: Insufficient documentation

## 2015-04-16 DIAGNOSIS — T84012S Broken internal right knee prosthesis, sequela: Secondary | ICD-10-CM

## 2015-04-16 MED ORDER — TRAMADOL HCL 50 MG PO TABS: 50.0000 mg | ORAL_TABLET | Freq: Four times a day (QID) | ORAL | Status: DC | PRN

## 2015-04-16 MED ORDER — DICLOFENAC SODIUM 1 % TD GEL: 2.0000 g | Freq: Four times a day (QID) | TRANSDERMAL | Status: DC

## 2015-04-16 NOTE — Patient Instructions (Signed)
Will do repeat radiofrequency ablation right-sided low back

## 2015-04-16 NOTE — Progress Notes (Signed)
Subjective:    Patient ID: Margaret [name], female    DOB: 01/05/1961, 54 y.o.   MRN: [id number]  HPI Chief complaint is low back pain greater than knee pain  54 year old female with history of osteoarthritis of both knees. In addition she's had low back pain. She's been evaluated by orthopedics as well as by pain management.  She underwent radiofrequency ablation on the right side by Dr.'s by the on 08/21/2014. This gave about 4 months relief. This was Right  L2-3 L3-4 L4-5 L5-S1. Patient also has been treated with narcotic analgesics oxycodone 10 mg 3 times per day Because of insurance issues patient needed to change pain management clinics. Past surgical history: LeftUnicompartmental knee replacement In 2000 Right total knee replacement in 2002 Left total knee replacement 2012 Right total knee revision 2014 Removal of scar tissue right knee July 2014  Last oxycodone 10mg  on Sunday Pain Inventory Average Pain 8 Pain Right Now 6 My pain is intermittent, constant, sharp, burning, dull, stabbing, tingling and aching  In the last 24 hours, has pain interfered with the following? General activity 6 Relation with others 2 Enjoyment of life 5 What TIME of day is your pain at its worst? morning, night Sleep (in general) Fair  Pain is worse with: walking, bending, sitting, standing and some activites Pain improves with: rest, heat/ice, medication and injections Relief from Meds: 8  Mobility walk without assistance walk with assistance use a cane ability to climb steps?  yes do you drive?  yes Do you have any goals in this area?  yes  Function disabled: date disabled 2004 I need assistance with the following:  meal prep and household duties  Neuro/Psych weakness numbness tingling trouble walking spasms  Prior Studies new visit  Physicians involved in your care new visit   Family History  Problem Relation Age of Onset  . Hyperlipidemia Mother   . Hypertension  Mother   . Heart disease Mother   . Heart disease Father   . Cancer Father   . Kidney disease Father   . Heart disease Sister    History   Social History  . Marital Status: Married    Spouse Name: N/A  . Number of Children: N/A  . Years of Education: N/A   Social History Main Topics  . Smoking status: Former Smoker    Quit date: 07/13/2008  . Smokeless tobacco: Not on file  . Alcohol Use: No  . Drug Use: No  . Sexual Activity: Yes   Other Topics Concern  . None   Social History Narrative   Past Surgical History  Procedure Laterality Date  . Tubal ligation    . Hand surgery    . Tonsillectomy    . Dilatation & currettage/hysteroscopy with resectocope  09/23/2012    Procedure: DILATATION & CURETTAGE/HYSTEROSCOPY WITH RESECTOCOPE;  Surgeon: John S McComb, MD;  Location: [facility];  Service: Gynecology;  Laterality: N/A;  . Carpal tunnel release      2000, 2001, both hands  . Joint replacement  2000, 2012,    knee replacement bilateral left-2000, 2012 right-2002  . Vaginal birth after cesarean section      83, 85,87  . Vaginal delivery      83 ,85,87  . Total knee revision  11/08/2012    Procedure: TOTAL KNEE REVISION;  Surgeon: Valeria BatmanPeter W [name], MD;  [facility];  Service: Orthopedics;  Laterality: Right;  Revision Right Total Knee Replacement    Past Medical History  Diagnosis Date  . Total knee replacement status   . Thyroid disease   . Hypothyroidism   . Mental disorder     perimenopause use of Paxil  . Arthritis     knees   . Hypertension     on meds, reviewed by Dr. Jacinto Halim for cardiac clearance - 09/2013, told that she was cleared & no need to follow up with him    BP 122/69 mmHg  Pulse 60  Resp 14  SpO2 99%  Opioid Risk Score: 1 Fall Risk Score:  `1  Depression screen PHQ 2/9  Depression screen PHQ 2/9 04/16/2015  Decreased Interest 1  Down, Depressed, Hopeless 0  PHQ - 2 Score 1  Altered sleeping 2  Tired, decreased energy 2  Change in  appetite 1  Feeling bad or failure about yourself  0  Trouble concentrating 1  Moving slowly or fidgety/restless 0  Suicidal thoughts 0  PHQ-9 Score 7     Review of Systems  Musculoskeletal: Positive for gait problem.  Neurological: Positive for weakness and numbness.       Tingling spasms  All other systems reviewed and are negative.      Objective:   Physical Exam  Constitutional: She is oriented to person, place, and time. She appears well-developed and well-nourished.  obese  HENT:  Head: Normocephalic and atraumatic.  Eyes: Conjunctivae and EOM are normal. Pupils are equal, round, and reactive to light.  Neck: Normal range of motion.  Cardiovascular: Normal rate and regular rhythm.   Pulmonary/Chest: Effort normal and breath sounds normal.  Abdominal: Soft. Bowel sounds are normal.  Musculoskeletal:       Right knee: She exhibits decreased range of motion, LCL laxity and MCL laxity. She exhibits no effusion. Tenderness found. Medial joint line tenderness noted.       Left knee: She exhibits decreased range of motion, LCL laxity and MCL laxity. She exhibits no effusion. Tenderness found. Medial joint line tenderness noted.       Cervical back: She exhibits normal range of motion and no tenderness.       Lumbar back: She exhibits decreased range of motion and tenderness. She exhibits no deformity.  Neurological: She is alert and oriented to person, place, and time. She has normal strength. A sensory deficit is present.  Sensory deficit bilateral medial knee  Otherwise sensation intact to pinprick in bilateral upper and lower limbs  Motor strength is 5/5 bilateral deltoids, biceps, triceps, grip 4/5 and hip flexor and extensor ankle dorsi flexor plantar flexor  Skin: Skin is warm and dry.  Psychiatric: She has a normal mood and affect.  Nursing note and vitals reviewed.         Assessment & Plan:  1. Chronic low back pain has responded to radiofrequency ablation  right L2-3-4 5 levels. This was done in November 2015. It lasted around 4 months or more. Recommend repeat procedure under fluoroscopic guidance  If right-sided pain improves and left-sided low back pain becomes more noticeable would recommend medial branch blocks at corresponding levels on the left side. This may be followed with left-sided radiofrequency ablation if she has positive response to medial branch blocks 2 at least 50% pain reduction.  3. Chronic postoperative knee pain patient has adequate in indication for narcotic analgesics. She is on fairly low dose. May consider Butrans-patch Other treatment options include ultrasound guided nerve blocks of the anterior femoral cutaneous nerve as well as infrapatellar branch of the saphenous nerve with cryoablation  if patient has good short-term relief with blocks   Discussed with patient agrees with plan Return to clinic next available for repeat radio frequency ablation

## 2015-04-17 LAB — PMP ALCOHOL METABOLITE (ETG): Ethyl Glucuronide (EtG): NEGATIVE ng/mL

## 2015-04-20 LAB — OXYCODONE, URINE (LC/MS-MS)
Noroxycodone, Ur: >10000 ng/mL (ref <50)
Oxycodone, ur: 16075 ng/mL (ref <50)
Oxymorphone: 9251 ng/mL (ref <50)

## 2015-04-20 LAB — MDMA (ECSTASY), URINE
MDA GC/MS confirm: NEGATIVE ng/mL (ref <200)
MDMA GC/MS Conf: NEGATIVE ng/mL (ref <200)

## 2015-04-20 LAB — OPIATES/OPIOIDS (LC/MS-MS)
Codeine Urine: NEGATIVE ng/mL (ref <50)
Hydrocodone: NEGATIVE ng/mL (ref <50)
Hydromorphone: NEGATIVE ng/mL (ref <50)
Morphine Urine: NEGATIVE ng/mL (ref <50)
Norhydrocodone, Ur: NEGATIVE ng/mL (ref <50)
Noroxycodone, Ur: >10000 ng/mL (ref <50)
Oxycodone, ur: 16075 ng/mL (ref <50)
Oxymorphone: 9251 ng/mL (ref <50)

## 2015-04-20 LAB — AMPHETAMINES (GC/LC/MS), URINE
Amphetamine GC/MS Conf: NEGATIVE ng/mL (ref <250)
Methamphetamine Quant, Ur: NEGATIVE ng/mL (ref <250)

## 2015-04-23 LAB — PRESCRIPTION MONITORING PROFILE (SOLSTAS)
Barbiturate Screen, Urine: NEGATIVE ng/mL
Benzodiazepine Screen, Urine: NEGATIVE ng/mL
Buprenorphine, Urine: NEGATIVE ng/mL
Cannabinoid Scrn, Ur: NEGATIVE ng/mL
Carisoprodol, Urine: NEGATIVE ng/mL
Cocaine Metabolites: NEGATIVE ng/mL
Creatinine, Urine: 170.56 mg/dL (ref >20.0)
Fentanyl, Ur: NEGATIVE ng/mL
Meperidine, Ur: NEGATIVE ng/mL
Methadone Screen, Urine: NEGATIVE ng/mL
Nitrites, Initial: NEGATIVE ug/mL
Propoxyphene: NEGATIVE ng/mL
Tapentadol, urine: NEGATIVE ng/mL
Tramadol Scrn, Ur: NEGATIVE ng/mL
Zolpidem, Urine: NEGATIVE ng/mL
pH, Initial: 5.9 pH (ref 4.5–8.9)

## 2015-04-30 NOTE — Progress Notes (Addendum)
According to note, last oxycodone taken Sunday 04/14/15, [facility] record shows 04/13/15. Test date 04/16/15  Levels are very high for this being date of last dose.  After 2 days you would expect to only see slight metabolite or even no medication in some.  NCCSR shows she has not been prescribed oxycodone since 12/02/14 from [facility]. Therefore this is an inconsistent UDS, unless there is something I did not see in the note. I verified with [facility] pharmacy the last dispense date as well.

## 2015-05-03 ENCOUNTER — Telehealth: Payer: Self-pay | Admitting: *Deleted

## 2015-05-03 NOTE — Telephone Encounter (Signed)
May call in Butrans 5 mcg patch , change q weekly

## 2015-05-03 NOTE — Telephone Encounter (Signed)
Patient called and left message that she needs a refill on her pain meds and is looking for something stronger due to all the running around she has been doing with her husband because he has cancer and it is a very stressful time and she has been doing a lot of physical things due to his illness.  Is it ok to refill tramadol? Please advise

## 2015-05-07 ENCOUNTER — Other Ambulatory Visit: Payer: Self-pay | Admitting: *Deleted

## 2015-05-07 ENCOUNTER — Encounter: Payer: Self-pay | Admitting: Physical Medicine & Rehabilitation

## 2015-05-07 ENCOUNTER — Other Ambulatory Visit: Payer: Self-pay | Admitting: Physical Medicine & Rehabilitation

## 2015-05-07 MED ORDER — TRAMADOL HCL 50 MG PO TABS: ORAL_TABLET | ORAL | Status: DC

## 2015-05-07 MED ORDER — BUPRENORPHINE 5 MCG/HR TD PTWK: 5.0000 ug | MEDICATED_PATCH | TRANSDERMAL | Status: DC

## 2015-05-07 NOTE — Telephone Encounter (Signed)
Miss [name] is asking for a refill for Tramadol....please advise

## 2015-05-07 NOTE — Addendum Note (Signed)
Addended by: Angela NevinWESSLING, Leshia Kope D on: 05/07/2015 05:29 PM   Modules accepted: Orders

## 2015-05-07 NOTE — Telephone Encounter (Signed)
Medication called in to pt's preferred pharmacy per verbal readback, pt called, left message to inform

## 2015-05-09 ENCOUNTER — Encounter: Payer: Self-pay | Admitting: Physical Medicine & Rehabilitation

## 2015-05-13 ENCOUNTER — Other Ambulatory Visit: Payer: Self-pay | Admitting: Physical Medicine & Rehabilitation

## 2015-05-13 MED ORDER — TRAMADOL HCL 50 MG PO TABS: 100.0000 mg | ORAL_TABLET | Freq: Three times a day (TID) | ORAL | Status: DC

## 2015-05-14 ENCOUNTER — Ambulatory Visit: Payer: PPO | Admitting: Physical Medicine & Rehabilitation

## 2015-05-14 ENCOUNTER — Encounter: Payer: PPO | Attending: Physical Medicine & Rehabilitation

## 2015-05-14 ENCOUNTER — Telehealth: Payer: Self-pay | Admitting: *Deleted

## 2015-05-14 DIAGNOSIS — M47816 Spondylosis without myelopathy or radiculopathy, lumbar region: Secondary | ICD-10-CM | POA: Insufficient documentation

## 2015-05-14 DIAGNOSIS — Z79899 Other long term (current) drug therapy: Secondary | ICD-10-CM | POA: Insufficient documentation

## 2015-05-14 DIAGNOSIS — T84092S Other mechanical complication of internal right knee prosthesis, sequela: Secondary | ICD-10-CM | POA: Insufficient documentation

## 2015-05-14 DIAGNOSIS — Z5181 Encounter for therapeutic drug level monitoring: Secondary | ICD-10-CM | POA: Insufficient documentation

## 2015-05-14 DIAGNOSIS — G894 Chronic pain syndrome: Secondary | ICD-10-CM | POA: Insufficient documentation

## 2015-05-14 MED ORDER — TRAMADOL HCL 50 MG PO TABS
100.0000 mg | ORAL_TABLET | Freq: Three times a day (TID) | ORAL | Status: DC
Start: 2015-05-14 — End: 2015-06-20

## 2015-05-14 NOTE — Telephone Encounter (Signed)
Per Dr Wynn Banker Tramadol may be increased to 2 tablets tid #180.  This has been called to the pharmacy

## 2015-05-14 NOTE — Progress Notes (Signed)
Tramadol ordered per Dr Wynn Banker conversation with Gavin Pound via MyChart.

## 2015-06-14 ENCOUNTER — Ambulatory Visit: Payer: PPO | Admitting: Physical Medicine & Rehabilitation

## 2015-06-14 ENCOUNTER — Ambulatory Visit: Payer: PPO

## 2015-06-19 ENCOUNTER — Other Ambulatory Visit: Payer: Self-pay | Admitting: Physical Medicine & Rehabilitation

## 2015-06-20 ENCOUNTER — Other Ambulatory Visit: Payer: Self-pay | Admitting: *Deleted

## 2015-06-20 MED ORDER — TRAMADOL HCL 50 MG PO TABS: 100.0000 mg | ORAL_TABLET | Freq: Three times a day (TID) | ORAL | Status: DC

## 2015-07-12 ENCOUNTER — Ambulatory Visit: Payer: PPO | Admitting: Physical Medicine & Rehabilitation

## 2015-08-21 ENCOUNTER — Other Ambulatory Visit: Payer: Self-pay | Admitting: Physical Medicine & Rehabilitation

## 2015-08-23 ENCOUNTER — Encounter: Payer: Self-pay | Admitting: Physical Medicine & Rehabilitation

## 2015-08-26 ENCOUNTER — Other Ambulatory Visit: Payer: Self-pay | Admitting: Physical Medicine & Rehabilitation

## 2015-08-27 ENCOUNTER — Encounter: Payer: Self-pay | Admitting: Physical Medicine & Rehabilitation

## 2016-02-19 DIAGNOSIS — M170 Bilateral primary osteoarthritis of knee: Secondary | ICD-10-CM | POA: Diagnosis not present

## 2016-02-19 DIAGNOSIS — E039 Hypothyroidism, unspecified: Secondary | ICD-10-CM | POA: Diagnosis not present

## 2016-02-19 DIAGNOSIS — E785 Hyperlipidemia, unspecified: Secondary | ICD-10-CM | POA: Diagnosis not present

## 2016-02-19 DIAGNOSIS — I10 Essential (primary) hypertension: Secondary | ICD-10-CM | POA: Diagnosis not present

## 2016-02-19 DIAGNOSIS — Z1159 Encounter for screening for other viral diseases: Secondary | ICD-10-CM | POA: Diagnosis not present

## 2016-03-27 DIAGNOSIS — M545 Low back pain: Secondary | ICD-10-CM | POA: Diagnosis not present

## 2016-03-27 DIAGNOSIS — M25562 Pain in left knee: Secondary | ICD-10-CM | POA: Diagnosis not present

## 2016-04-08 DIAGNOSIS — M4316 Spondylolisthesis, lumbar region: Secondary | ICD-10-CM | POA: Diagnosis not present

## 2016-04-08 DIAGNOSIS — M5416 Radiculopathy, lumbar region: Secondary | ICD-10-CM | POA: Diagnosis not present

## 2016-04-17 DIAGNOSIS — M4316 Spondylolisthesis, lumbar region: Secondary | ICD-10-CM | POA: Diagnosis not present

## 2016-04-23 DIAGNOSIS — G894 Chronic pain syndrome: Secondary | ICD-10-CM | POA: Diagnosis not present

## 2016-04-23 DIAGNOSIS — G8929 Other chronic pain: Secondary | ICD-10-CM | POA: Diagnosis not present

## 2016-04-23 DIAGNOSIS — M542 Cervicalgia: Secondary | ICD-10-CM | POA: Diagnosis not present

## 2016-04-23 DIAGNOSIS — M545 Low back pain: Secondary | ICD-10-CM | POA: Diagnosis not present

## 2016-04-23 DIAGNOSIS — M25562 Pain in left knee: Secondary | ICD-10-CM | POA: Diagnosis not present

## 2016-04-23 DIAGNOSIS — M25561 Pain in right knee: Secondary | ICD-10-CM | POA: Diagnosis not present

## 2016-04-28 DIAGNOSIS — Z6841 Body Mass Index (BMI) 40.0 and over, adult: Secondary | ICD-10-CM | POA: Diagnosis not present

## 2016-04-28 DIAGNOSIS — N950 Postmenopausal bleeding: Secondary | ICD-10-CM | POA: Diagnosis not present

## 2016-04-28 DIAGNOSIS — Z1231 Encounter for screening mammogram for malignant neoplasm of breast: Secondary | ICD-10-CM | POA: Diagnosis not present

## 2016-04-28 DIAGNOSIS — Z01419 Encounter for gynecological examination (general) (routine) without abnormal findings: Secondary | ICD-10-CM | POA: Diagnosis not present

## 2016-04-28 DIAGNOSIS — N939 Abnormal uterine and vaginal bleeding, unspecified: Secondary | ICD-10-CM | POA: Diagnosis not present

## 2016-06-10 ENCOUNTER — Encounter: Payer: Self-pay | Admitting: Obstetrics and Gynecology

## 2016-08-31 DIAGNOSIS — I10 Essential (primary) hypertension: Secondary | ICD-10-CM | POA: Diagnosis not present

## 2016-08-31 DIAGNOSIS — E785 Hyperlipidemia, unspecified: Secondary | ICD-10-CM | POA: Diagnosis not present

## 2016-08-31 DIAGNOSIS — E039 Hypothyroidism, unspecified: Secondary | ICD-10-CM | POA: Diagnosis not present

## 2016-08-31 DIAGNOSIS — Z1211 Encounter for screening for malignant neoplasm of colon: Secondary | ICD-10-CM | POA: Diagnosis not present

## 2016-08-31 DIAGNOSIS — M170 Bilateral primary osteoarthritis of knee: Secondary | ICD-10-CM | POA: Diagnosis not present

## 2016-12-01 DIAGNOSIS — R05 Cough: Secondary | ICD-10-CM | POA: Diagnosis not present

## 2016-12-01 DIAGNOSIS — R197 Diarrhea, unspecified: Secondary | ICD-10-CM | POA: Diagnosis not present

## 2016-12-01 DIAGNOSIS — M791 Myalgia: Secondary | ICD-10-CM | POA: Diagnosis not present

## 2016-12-01 DIAGNOSIS — R509 Fever, unspecified: Secondary | ICD-10-CM | POA: Diagnosis not present

## 2017-02-15 ENCOUNTER — Telehealth (INDEPENDENT_AMBULATORY_CARE_PROVIDER_SITE_OTHER): Payer: Self-pay | Admitting: Physical Medicine and Rehabilitation

## 2017-02-15 NOTE — Telephone Encounter (Signed)
Would need OV first, which Pain mgt office and we are not set up to take over opiod/narcotics

## 2017-02-16 NOTE — Telephone Encounter (Signed)
Called patient and left message for her to call back to discuss/ schedule an OV.

## 2017-02-16 NOTE — Telephone Encounter (Signed)
Patient is scheduled for an OV 02/23/17 at 0830. She saw a doctor at [name] pain management, but she only saw him once before he left the office.

## 2017-02-23 ENCOUNTER — Ambulatory Visit (INDEPENDENT_AMBULATORY_CARE_PROVIDER_SITE_OTHER): Payer: PPO | Admitting: Physical Medicine and Rehabilitation

## 2017-02-23 ENCOUNTER — Encounter (INDEPENDENT_AMBULATORY_CARE_PROVIDER_SITE_OTHER): Payer: Self-pay | Admitting: Physical Medicine and Rehabilitation

## 2017-02-23 VITALS — BP 158/68 | HR 57

## 2017-02-23 DIAGNOSIS — M542 Cervicalgia: Secondary | ICD-10-CM | POA: Diagnosis not present

## 2017-02-23 DIAGNOSIS — M25551 Pain in right hip: Secondary | ICD-10-CM | POA: Diagnosis not present

## 2017-02-23 DIAGNOSIS — R202 Paresthesia of skin: Secondary | ICD-10-CM

## 2017-02-23 DIAGNOSIS — G894 Chronic pain syndrome: Secondary | ICD-10-CM

## 2017-02-23 DIAGNOSIS — M47816 Spondylosis without myelopathy or radiculopathy, lumbar region: Secondary | ICD-10-CM | POA: Diagnosis not present

## 2017-02-23 MED ORDER — TRAMADOL HCL 50 MG PO TABS: 50.0000 mg | ORAL_TABLET | Freq: Two times a day (BID) | ORAL | 0 refills | Status: DC | PRN

## 2017-02-23 NOTE — Progress Notes (Deleted)
Lower back pain worse on right side. Sometimes hurts into knees and feet. Numbness and tingling both legs, especially feet. Sometimes has swelling in feet. Pain increases with standing or walking a lot. Pain level is about an 8 most of the time. Uses heating pad or ice pack. [place] appointment [place] mang[place] [facility] last July, received a letter saying they were closing the practice.

## 2017-02-23 NOTE — Patient Instructions (Signed)
Take tylenol ES 2 caps 3 times per day  Start taking gabapentin AM and PM

## 2017-02-23 NOTE — Progress Notes (Signed)
Margaret [name] - 56 y.o. female MRN [id number]  Date of birth: 1960-10-24  Office Visit Note: Visit Date: 02/23/2017 PCP: Margaret Schlatter, MD Referred by: Margaret Schlatter, MD  Subjective: Chief Complaint  Patient presents with  . Lower Back - Pain   HPI: Margaret [name] is a pleasant 56 year old female who I have seen in the office on occasion 2 years ago for back pain. She is typically followed by Margaret [name] and Margaret [name]. She has seen them for her knees and has had a history of right total knee replacement. She has a history of chronic pain and chronic back pain. She also reports neck and shoulder pain and arm pain as well as leg pain. She essentially has widespread body pain. She comes in today because she was seeing a physiatry wrist and [place]. This was Margaret [name]. His note is actually in the chart to review. He had referred her to bariatric surgery as well as talk about functional restoration and was giving her a small bit of tramadol. She says tramadol didn't help very much. She was taking this 4 times a day. She reports that the clinic sent her a letter that said they were closing down. This doctor did work to [employer] and I know there is been some turnover there. She comes in today complaining mainly of low back pain chronic recalcitrant and worse on the right side. She gets some referral pain into the knees and feet. She feels like there is a paresthesia and numbness and tingling in both legs especially the feet and this is nondermatomal on top and bottom of feet. She does get swelling in the legs. She reports some swelling in the hands. She has had bilateral carpal tunnel release. She does report pain increases when she stands or walks a lot. She reports an 8 out of 10 pain level most of the time. She uses ibuprofen 400 mg 3 times per day as well as Tylenol 3-4 times per day. She is not using tramadol at this point. She has difficulty sleeping at night. She has  been taking gabapentin 300 mg at night and actually up this recently but felt like it gave her a hangover. I did check the [facility] and she is not receiving opioids from any other provider.  She has not had physical therapy in a couple of years. She states she is taking care of her husband who has cancer. She states that she is under a lot of emotional stress and pain. She did state that she has not been able to see the bariatric surgeons. She does get morning stiffness. She asked me today about a rheumatologist. She has never seen a rheumatologist for evaluation. She does not carry a diagnosis of fibromyalgia. She does report decreased energy and fatigue. She does currently use Tumeric as well as she had gin and raisens and other home remedies.  Again she endorses neck and upper back pain as well but her main complaint is the low back and hip pain. Denies specific groin pain.  MRI is reviewed again this was completed in 2015. We show this to the patient she insisted that her pain is really worsened since that time. The MRI basically shows some degenerative change with facet joint arthritis moderate without nerve compression or severe stenosis.    Review of Systems  Constitutional: Positive for malaise/fatigue. Negative for chills, fever and weight loss.  HENT: Negative for hearing loss and sinus  pain.   Eyes: Negative for blurred vision, double vision and photophobia.  Respiratory: Negative for cough and shortness of breath.   Cardiovascular: Negative for chest pain, palpitations and leg swelling.  Gastrointestinal: Negative for abdominal pain, nausea and vomiting.  Genitourinary: Negative for flank pain.  Musculoskeletal: Positive for back pain, joint pain and neck pain. Negative for myalgias.  Skin: Negative for itching and rash.  Neurological: Positive for tingling and weakness. Negative for tremors and focal weakness.  Endo/Heme/Allergies: Negative.   Psychiatric/Behavioral:  Negative for depression.  All other systems reviewed and are negative.  Otherwise per HPI.  Assessment & Plan: Visit Diagnoses:  1. Spondylosis of lumbar region without myelopathy or radiculopathy   2. Chronic pain syndrome   3. Pain in right hip   4. Cervicalgia   5. Paresthesia of skin     Plan: Findings:  Chronic long-term mainly low back and hip and leg pain but with really widespread body pain. Some factors that could be rheumatologic. At the patient's request I do want to send a referral to Margaret [name] for evaluation. This would be to rule out rheumatologic condition or fibromyalgia. The patient is not on chronic opioid medication I do not recommend at this time. Her lumbar spine was reviewed and it shows arthritic changes particularly at L4-5. Prior injections were only mildly beneficial. She has seen other physicians at the plastic including [facility] [name] as well as [name] and [facility]. She has not seen a rheumatologist.  In terms of her current back pain I do want to repeat the lumbar spine MRI because of worsening symptoms. There may be some level of stenosis that has progressed in the 3 year time. I do think some of her back pain probably is facet mediated but this wouldn't explain the totality of her symptoms. I tried to express with her that a lot of her symptoms are very somatic A lot of physical complaints and she obviously has pain but it's an issue of trying to find the cause of the appropriate treatment. I do not think she is a good candidate for opioid management and we discussed in the past that we are not set up to do that. She insisted this really not what she wants at all and she just wants to try to find some way to manage and relieve some of her symptoms. We talked about activity modification and getting exercise and she is going to try to do that. It would be wise if she did fall with the bariatric surgeons if she could she does have a  significant amount of weight to lose. I think that would help her back pain and her knee pain. Or I will give her a small prescription for the tramadol. I also want her to try Tylenol scheduled 3 times a day. I also want her to take her gabapentin and take it at night as well as in the morning. She might be a good candidate for Cymbalta given the stress with the husband with cancer and just her general pain complaints. I would wait to see what Dr. Titus Dubinevashwar season her consultation.    Meds & Orders:  Meds ordered this encounter  Medications  . traMADol (ULTRAM) 50 MG tablet    Sig: Take 1 tablet (50 mg total) by mouth every 12 (twelve) hours as needed for moderate pain.    Dispense:  40 tablet    Refill:  0    Orders Placed  This Encounter  Procedures  . MR LUMBAR SPINE WO CONTRAST  . Ambulatory referral to Rheumatology    Follow-up: Return for MRI review after completion.   Procedures: No procedures performed  No notes on file   Clinical History: MRI LUMBAR SPINE WITHOUT CONTRAST  TECHNIQUE: Multiplanar, multisequence MR imaging was performed. No intravenous contrast was administered.  COMPARISON:  None.  FINDINGS: Last fully open disk space is labeled L5-S1. Present examination incorporates from T11-12 disc space through the lower sacrum.  Conus L1 level.  Mild atherosclerotic type changes of the aorta suspected.  T11-12: Bulge/spur with minimal retrolisthesis T11. Mild spinal stenosis without cord compression.  T12-L1:  Negative.  L1-2:  Bulge.  Very mild spinal stenosis.  L2-3:  Minimal Schmorl's node deformity.  L3-4:  Mild facet joint degenerative changes.  L4-5: Moderate facet joint degenerative changes. Disc degeneration with disc space narrowing. Minimal anterior slip L4. Bulge with foraminal extension greater on the left with encroachment upon the exiting left L4 nerve root. Very mild spinal stenosis.  L5-S1: Mild to moderate facet joint  degenerative changes. Disc degeneration with bulge/osteophyte with foraminal extension slightly greater on left. Mild encroachment upon the exiting L5 nerve roots greater on the left.  IMPRESSION: T11-12 bulge/spur with minimal retrolisthesis T11. Mild spinal stenosis without cord compression.  L1-2 bulge.  Very mild spinal stenosis.  L4-5 moderate facet joint degenerative changes. Minimal anterior slip L4. Bulge with foraminal extension greater on the left with encroachment upon the exiting left L4 nerve root. Very mild spinal stenosis.  L5-S1 mild to moderate facet joint degenerative changes. Bulge/osteophyte with foraminal extension slightly greater on left. Mild encroachment upon the exiting L5 nerve roots greater on the left.  Please see above for further detail.   Electronically Signed   By: Bridgett Larsson M.D.   On: 02/01/2014 10:27  She reports that she quit smoking about 8 years ago. She has never used smokeless tobacco. No results for input(s): HGBA1C, LABURIC in the last 8760 hours.  Objective:  VS:  HT:    WT:   BMI:     BP:(!) 158/68  HR:(!) 57bpm  TEMP: ( )  RESP:  Physical Exam  Constitutional: She is oriented to person, place, and time. She appears well-developed and well-nourished. No distress.  Obese  HENT:  Head: Normocephalic and atraumatic.  Nose: Nose normal.  Mouth/Throat: Oropharynx is clear and moist.  Eyes: Conjunctivae are normal. Pupils are equal, round, and reactive to light.  Neck: Normal range of motion. Neck supple. No tracheal deviation present.  Cardiovascular: Regular rhythm and intact distal pulses.   Pulmonary/Chest: Effort normal. No respiratory distress. She has no wheezes.  Abdominal: She exhibits no distension. There is no guarding.  Musculoskeletal:  Patient is slow to rise from a seated position she does have concordant pain with low back extension. She has some tenderness over the greater trochanters but not exquisite.  Some tenderness to palpation across the lower back but no real tender points or trigger points. She has pretty exquisite pain with right hip internal rotation but it does not cause groin pain. His more lateral pain with rotation. She has good distal strength. She has a well-healed surgical scar on the right knee. She has some generalized edema in the feet bilaterally. Examination of the hands does not show any synovitis or atrophy. She does have some arthritic changes.  Lymphadenopathy:    She has no cervical adenopathy.  Neurological: She is alert and oriented to person, place,  and time.  Skin: Skin is warm. No rash noted. No erythema.  Psychiatric: She has a normal mood and affect. Her behavior is normal.  Nursing note and vitals reviewed.   Ortho Exam Imaging: No results found.  Past Medical/Family/Surgical/Social History: Medications & Allergies reviewed per EMR Patient Active Problem List   Diagnosis Date Noted  . Failed total right knee replacement (HCC) 11/10/2012  . Obesity, Class III, BMI 40-49.9 (morbid obesity) (HCC) 11/10/2012  . Endometrial polyp 09/23/2012    Class: Present on Admission  . Hot flushes, perimenopausal 08/03/2012  . Menorrhagia 07/21/2012  . Menopause 07/13/2012  . Other and unspecified hyperlipidemia 07/13/2012  . DJD (degenerative joint disease) 07/13/2012  . Bereavement 07/13/2012  . Essential hypertension, benign 07/13/2012  . History of migraine 07/13/2012  . Hypothyroidism 07/13/2012   Past Medical History:  Diagnosis Date  . Arthritis    knees   . Hypertension    on meds, reviewed by Dr. Jacinto Halim for cardiac clearance - 09/2013, told that she was cleared & no need to follow up with him   . Hypothyroidism   . Mental disorder    perimenopause use of Paxil  . Thyroid disease   . Total knee replacement status    Family History  Problem Relation Age of Onset  . Hyperlipidemia Mother   . Hypertension Mother   . Heart disease Mother   . Heart  disease Father   . Cancer Father   . Kidney disease Father   . Heart disease Sister    Past Surgical History:  Procedure Laterality Date  . CARPAL TUNNEL RELEASE     2000, 2001, both hands  . DILATATION & CURRETTAGE/HYSTEROSCOPY WITH RESECTOCOPE  09/23/2012   Procedure: DILATATION & CURETTAGE/HYSTEROSCOPY WITH RESECTOCOPE;  Surgeon: Juluis Mire, MD;  Location: [facility];  Service: Gynecology;  Laterality: N/A;  . HAND SURGERY    . JOINT REPLACEMENT  2000, 2012,   knee replacement bilateral left-2000, 2012 right-2002  . TONSILLECTOMY    . TOTAL KNEE REVISION  11/08/2012   Procedure: TOTAL KNEE REVISION;  Surgeon: Valeria Batman, MD;  Location: [facility] OR;  Service: Orthopedics;  Laterality: Right;  Revision Right Total Knee Replacement   . TUBAL LIGATION    . VAGINAL BIRTH AFTER CESAREAN SECTION     83, 85,87  . VAGINAL DELIVERY     [phone]   Social History   Occupational History  . Not on file.   Social History Main Topics  . Smoking status: Former Smoker    Quit date: 07/13/2008  . Smokeless tobacco: Never Used  . Alcohol use No  . Drug use: No  . Sexual activity: Yes

## 2017-03-17 ENCOUNTER — Ambulatory Visit
Admission: RE | Admit: 2017-03-17 | Discharge: 2017-03-17 | Disposition: A | Discharge status: Home / Self Care | Payer: PPO | Source: Ambulatory Visit | Attending: Physical Medicine and Rehabilitation | Admitting: Physical Medicine and Rehabilitation

## 2017-03-17 DIAGNOSIS — M5126 Other intervertebral disc displacement, lumbar region: Secondary | ICD-10-CM | POA: Diagnosis not present

## 2017-03-17 DIAGNOSIS — M47816 Spondylosis without myelopathy or radiculopathy, lumbar region: Secondary | ICD-10-CM

## 2017-03-17 DIAGNOSIS — M25551 Pain in right hip: Secondary | ICD-10-CM

## 2017-03-23 ENCOUNTER — Encounter (INDEPENDENT_AMBULATORY_CARE_PROVIDER_SITE_OTHER): Payer: Self-pay | Admitting: Physical Medicine and Rehabilitation

## 2017-03-23 ENCOUNTER — Telehealth (INDEPENDENT_AMBULATORY_CARE_PROVIDER_SITE_OTHER): Payer: Self-pay | Admitting: Physical Medicine and Rehabilitation

## 2017-03-23 ENCOUNTER — Ambulatory Visit (INDEPENDENT_AMBULATORY_CARE_PROVIDER_SITE_OTHER): Payer: PPO | Admitting: Physical Medicine and Rehabilitation

## 2017-03-23 VITALS — BP 153/75 | HR 52

## 2017-03-23 DIAGNOSIS — M25551 Pain in right hip: Secondary | ICD-10-CM

## 2017-03-23 DIAGNOSIS — M47816 Spondylosis without myelopathy or radiculopathy, lumbar region: Secondary | ICD-10-CM | POA: Diagnosis not present

## 2017-03-23 DIAGNOSIS — G894 Chronic pain syndrome: Secondary | ICD-10-CM | POA: Diagnosis not present

## 2017-03-23 NOTE — Progress Notes (Deleted)
Here to review MRI. Back pain is the same as when last seen. Has been having pain and swelling in right ankle for the past few weeks.

## 2017-03-23 NOTE — Progress Notes (Signed)
Margaret [name] - 56 y.o. female MRN [id number]  Date of birth: [birth year]  Office Visit Note: Visit Date: 03/23/2017 PCP: Garth Schlatter, MD Referred by: Garth Schlatter, MD  Subjective: Chief Complaint  Patient presents with  . Lower Back - Pain   HPI: Margaret [name] returns today after lumbar spine MRI for review. She is a 56 year old female with chronic pain syndrome chronic low back pain and chronic widespread body pain and joint pain. She does have a lumbar with Dr. Titus Dubin upcoming for evaluation of possible rheumatologic condition versus fibromyalgia. She was evaluated in my office recently and given a tramadol prescription for which she does not ask for refills yet today. In the past she has had issues with chronic pain management and medication use. She reports worsening low back pain with some paresthesia-like pain in the right more than left leg. She's been reporting some swelling in the right ankle for the past few weeks without specific injury. She continues to have widespread body pain with pain in the shoulders and arms as well as the neck and low back. Her low back is the worse. It's worse with standing and ambulating. She has not reported any focal weakness or bowel or bladder symptoms. She's had no new trauma. No issues really different than we saw her last except for the right ankle swelling. MRI is reviewed below but is essentially unchanged from 2015. The MRI does show a little bit worsening arthritis at L4-5 and L5-S1. She has moderate facet arthropathy with mild lateral recess narrowing. No focal canal stenosis no disc herniations.    Review of Systems  Constitutional: Negative for chills, fever, malaise/fatigue and weight loss.  HENT: Negative for hearing loss and sinus pain.   Eyes: Negative for blurred vision, double vision and photophobia.  Respiratory: Negative for cough and shortness of breath.   Cardiovascular: Negative for chest pain, palpitations and leg  swelling.  Gastrointestinal: Negative for abdominal pain, nausea and vomiting.  Genitourinary: Negative for flank pain.  Musculoskeletal: Positive for back pain, joint pain and neck pain. Negative for myalgias.  Skin: Negative for itching and rash.  Neurological: Negative for tremors, focal weakness and weakness.  Endo/Heme/Allergies: Negative.   Psychiatric/Behavioral: Negative for depression.  All other systems reviewed and are negative.  Otherwise per HPI.  Assessment & Plan: Visit Diagnoses:  1. Spondylosis of lumbar region without myelopathy or radiculopathy   2. Chronic pain syndrome   3. Pain in right hip     Plan: Findings:  Chronic worsening severe at times axial low back pain worse with standing and walking. This is consistent with facet mediated low back pain. MRI shows evidence of moderate facet arthropathy at L4-5 and L5-S1 without nerve compression. It does not explain some of the paresthesias that she gets. I do think she would do well with bilateral facet joint blocks at L4-5 and L5-S1. Ultimately she may do well with radiofrequency ablation. She does have an appointment with Dr. Titus Dubin for more wide spread body pain with stiffness and overall generalized weakness. If she does have some component of fibromyalgia that obviously the course of action for that would be sleep management as well as exercises and whatever Dr. Titus Dubin deemed appropriate at that point. She did receive a prescription for tramadol and has not asked for refill continues to use that judiciously. I will see her back for the facet joint blocks.    Meds & Orders: No orders of the defined types were placed  in this encounter.  No orders of the defined types were placed in this encounter.   Follow-up: Return for Bilateral L4-5 and L5-S1 facet joint blocks..   Procedures: No procedures performed  No notes on file   Clinical History: MRI LUMBAR SPINE WITHOUT CONTRAST  TECHNIQUE: Multiplanar,  multisequence MR imaging of the lumbar spine was performed. No intravenous contrast was administered.  COMPARISON:  01/31/2014  FINDINGS: Segmentation:  Standard.  Alignment:  Physiologic.  Vertebrae:  No fracture, evidence of discitis, or bone lesion.  Conus medullaris: Extends to the T12 level and appears normal.  Paraspinal and other soft tissues: No paraspinal abnormality.  Disc levels:  Disc spaces: Degenerative disc disease disc height loss at T11-12, L1-2, L3-4, L4-5 and L5-S1.  T11-12: Small central disc protrusion. No foraminal or central canal stenosis.  T12-L1: No significant disc bulge. No evidence of neural foraminal stenosis. No central canal stenosis.  L1-L2: Broad-based disc bulge flattening the ventral thecal sac. Mild bilateral facet arthropathy. No evidence of neural foraminal stenosis. No central canal stenosis.  L2-L3: No significant disc bulge. No evidence of neural foraminal stenosis. No central canal stenosis.  L3-L4: Broad-based disc bulge flattening the ventral thecal sac and eccentric towards the left. Mild bilateral facet arthropathy. Mild left foraminal stenosis. No right foraminal stenosis. No central canal stenosis.  L4-L5: Mild broad-based disc bulge with a small left foraminal disc protrusion. Moderate bilateral facet arthropathy. Mild right and moderate left foraminal stenosis. Bilateral lateral recess narrowing. No central canal stenosis.  L5-S1: Mild broad-based disc bulge. Moderate bilateral facet arthropathy. Moderate bilateral foraminal narrowing. No central canal stenosis.  IMPRESSION: 1. Diffuse lumbar spine spondylosis as described above. 2. At L4-5 there is a mild broad-based disc bulge with a small left foraminal disc protrusion. Moderate bilateral facet arthropathy. Mild right and moderate left foraminal stenosis. Bilateral lateral recess narrowing.   Electronically Signed   By: Elige KoHetal  Patel   On:  03/17/2017 14:58  She reports that she quit smoking about 8 years ago. She has never used smokeless tobacco. No results for input(s): HGBA1C, LABURIC in the last 8760 hours.  Objective:  VS:  HT:    WT:   BMI:     BP:(!) 153/75  HR:(!) 52bpm  TEMP: ( )  RESP:  Physical Exam  Constitutional: She is oriented to person, place, and time. She appears well-developed and well-nourished.  Eyes: Conjunctivae and EOM are normal. Pupils are equal, round, and reactive to light.  Cardiovascular: Normal rate and intact distal pulses.   Pulmonary/Chest: Effort normal.  Musculoskeletal:  Patient ambulates without aid. She has pain with extension rotation of the lumbar spine and is slow to rise from seated position. She is tender across the greater trochanters as well as the upper shoulders. She has swelling of the ankles bilaterally but no redness or specific spot tenderness. She has good distal strength and no clonus.  Neurological: She is alert and oriented to person, place, and time. She exhibits normal muscle tone.  Skin: Skin is warm and dry. No rash noted. No erythema.  Psychiatric: She has a normal mood and affect. Her behavior is normal.  Nursing note and vitals reviewed.   Ortho Exam Imaging: No results found.  Past Medical/Family/Surgical/Social History: Medications & Allergies reviewed per EMR Patient Active Problem List   Diagnosis Date Noted  . Failed total right knee replacement (HCC) 11/10/2012  . Obesity, Class III, BMI 40-49.9 (morbid obesity) (HCC) 11/10/2012  . Endometrial polyp 09/23/2012  Class: Present on Admission  . Hot flushes, perimenopausal 08/03/2012  . Menorrhagia 07/21/2012  . Menopause 07/13/2012  . Other and unspecified hyperlipidemia 07/13/2012  . DJD (degenerative joint disease) 07/13/2012  . Bereavement 07/13/2012  . Essential hypertension, benign 07/13/2012  . History of migraine 07/13/2012  . Hypothyroidism 07/13/2012   Past Medical History:    Diagnosis Date  . Arthritis    knees   . Hypertension    on meds, reviewed by Dr. Jacinto Halim for cardiac clearance - 09/2013, told that she was cleared & no need to follow up with him   . Hypothyroidism   . Mental disorder    perimenopause use of Paxil  . Thyroid disease   . Total knee replacement status    Family History  Problem Relation Age of Onset  . Hyperlipidemia Mother   . Hypertension Mother   . Heart disease Mother   . Heart disease Father   . Cancer Father   . Kidney disease Father   . Heart disease Sister    Past Surgical History:  Procedure Laterality Date  . CARPAL TUNNEL RELEASE     2000, 2001, both hands  . DILATATION & CURRETTAGE/HYSTEROSCOPY WITH RESECTOCOPE  09/23/2012   Procedure: DILATATION & CURETTAGE/HYSTEROSCOPY WITH RESECTOCOPE;  Surgeon: Juluis Mire, MD;  Location: [facility];  Service: Gynecology;  Laterality: N/A;  . HAND SURGERY    . JOINT REPLACEMENT  2000, 2012,   knee replacement bilateral left-2000, 2012 right-2002  . TONSILLECTOMY    . TOTAL KNEE REVISION  11/08/2012   Procedure: TOTAL KNEE REVISION;  Surgeon: Valeria Batman, MD;  Location: [facility] OR;  Service: Orthopedics;  Laterality: Right;  Revision Right Total Knee Replacement   . TUBAL LIGATION    . VAGINAL BIRTH AFTER CESAREAN SECTION     83, 85,87  . VAGINAL DELIVERY     [phone]   Social History   Occupational History  . Not on file.   Social History Main Topics  . Smoking status: Former Smoker    Quit date: 07/13/2008  . Smokeless tobacco: Never Used  . Alcohol use No  . Drug use: No  . Sexual activity: Yes

## 2017-03-25 ENCOUNTER — Encounter (INDEPENDENT_AMBULATORY_CARE_PROVIDER_SITE_OTHER): Payer: Self-pay | Admitting: Physical Medicine and Rehabilitation

## 2017-03-25 NOTE — Telephone Encounter (Signed)
Submitted auth request on [employer] website and marked it expedited

## 2017-03-25 NOTE — Telephone Encounter (Signed)
Received auth eff 03/25/17-06/23/17. Auth #[id number].

## 2017-03-25 NOTE — Telephone Encounter (Signed)
Scheduled for 04/02/17 at 0830 with a driver.

## 2017-03-25 NOTE — Telephone Encounter (Signed)
Called patient and left message for her to call to schedule. 

## 2017-04-02 ENCOUNTER — Encounter (INDEPENDENT_AMBULATORY_CARE_PROVIDER_SITE_OTHER): Payer: Self-pay | Admitting: Physical Medicine and Rehabilitation

## 2017-04-02 ENCOUNTER — Ambulatory Visit (INDEPENDENT_AMBULATORY_CARE_PROVIDER_SITE_OTHER): Payer: PPO | Admitting: Physical Medicine and Rehabilitation

## 2017-04-02 ENCOUNTER — Ambulatory Visit (INDEPENDENT_AMBULATORY_CARE_PROVIDER_SITE_OTHER): Payer: PPO

## 2017-04-02 VITALS — BP 141/60 | HR 61

## 2017-04-02 DIAGNOSIS — M47816 Spondylosis without myelopathy or radiculopathy, lumbar region: Secondary | ICD-10-CM | POA: Diagnosis not present

## 2017-04-02 DIAGNOSIS — G894 Chronic pain syndrome: Secondary | ICD-10-CM | POA: Diagnosis not present

## 2017-04-02 MED ORDER — LIDOCAINE HCL (PF) 1 % IJ SOLN
2.0000 mL | Freq: Once | INTRAMUSCULAR | Status: AC
  Administered 2017-04-02: 2 mL

## 2017-04-02 MED ORDER — METHYLPREDNISOLONE ACETATE 80 MG/ML IJ SUSP
80.0000 mg | Freq: Once | INTRAMUSCULAR | Status: AC
  Administered 2017-04-02: 80 mg

## 2017-04-02 NOTE — Procedures (Signed)
Lumbar Facet Joint Intra-Articular Injection(s) with Fluoroscopic Guidance  Patient: Margaret [name]      Date of Birth: 01/03/1961 MRN: [id number] PCP: Garth SchlatterAmeen, William Otis, MD      Visit Date: 04/02/2017   [name] is a pleasant 56 year old female with chronic pain syndrome and chronic axial low back pain. Please see our prior evaluation and management note for further details and justification.  Universal Protocol:    Date/Time: 06/15/188:35 AM  Consent Given By: the patient  Position: PRONE   Additional Comments: Vital signs were monitored before and after the procedure. Patient was prepped and draped in the usual sterile fashion. The correct patient, procedure, and site was verified.   Injection Procedure Details:  Procedure Site One Meds Administered:  Meds ordered this encounter  Medications  . lidocaine (PF) (XYLOCAINE) 1 % injection 2 mL  . methylPREDNISolone acetate (DEPO-MEDROL) injection 80 mg     Laterality: Bilateral  Location/Site:  L4-L5 L5-S1  Needle size: 22 guage  Needle type: Spinal  Needle Placement: Articular  Findings:  -Contrast Used: 1 mL iohexol 180 mg iodine/mL   -Comments: Excellent flow of contrast producing a partial arthrogram.  Procedure Details: The fluoroscope beam is vertically oriented in AP, and the inferior recess is visualized beneath the lower pole of the inferior apophyseal process, which represents the target point for needle insertion. When direct visualization is difficult the target point is located at the medial projection of the vertebral pedicle. The region overlying each aforementioned target is locally anesthetized with a 1 to 2 ml. volume of 1% Lidocaine without Epinephrine.   The spinal needle was inserted into each of the above mentioned facet joints using biplanar fluoroscopic guidance. A 0.25 to 0.5 ml. volume of Isovue-250 was injected and a partial facet joint arthrogram was obtained. A single spot film was  obtained of the resulting arthrogram.    One to 1.25 ml of the steroid/anesthetic solution was then injected into each of the facet joints noted above.   Additional Comments:  The patient tolerated the procedure well No complications occurred Dressing: Band-Aid    Post-procedure details: Patient was observed during the procedure. Post-procedure instructions were reviewed.  Patient left the clinic in stable condition.

## 2017-04-02 NOTE — Progress Notes (Deleted)
Patient is here today for planned bilateral L4-5 and L5-S1 facet injection. No change in symptoms. 

## 2017-04-02 NOTE — Patient Instructions (Signed)

## 2017-04-27 ENCOUNTER — Ambulatory Visit (INDEPENDENT_AMBULATORY_CARE_PROVIDER_SITE_OTHER): Payer: PPO | Admitting: Physical Medicine and Rehabilitation

## 2017-04-27 ENCOUNTER — Encounter (INDEPENDENT_AMBULATORY_CARE_PROVIDER_SITE_OTHER): Payer: Self-pay | Admitting: Physical Medicine and Rehabilitation

## 2017-04-27 ENCOUNTER — Telehealth (INDEPENDENT_AMBULATORY_CARE_PROVIDER_SITE_OTHER): Payer: Self-pay | Admitting: Physical Medicine and Rehabilitation

## 2017-04-27 VITALS — BP 131/70 | HR 52

## 2017-04-27 DIAGNOSIS — G894 Chronic pain syndrome: Secondary | ICD-10-CM | POA: Diagnosis not present

## 2017-04-27 DIAGNOSIS — M47816 Spondylosis without myelopathy or radiculopathy, lumbar region: Secondary | ICD-10-CM

## 2017-04-27 NOTE — Progress Notes (Deleted)
Injection helped for about 5 days. Reports 75% relief for that time. Nagging pain is back now. Also notices leg and ankle swelling was decreased for the 5 days. Right side is still the worst. Walking and being up a lot makes pain worse.

## 2017-04-28 ENCOUNTER — Encounter (INDEPENDENT_AMBULATORY_CARE_PROVIDER_SITE_OTHER): Payer: Self-pay | Admitting: Physical Medicine and Rehabilitation

## 2017-04-28 NOTE — Progress Notes (Signed)
Margaret [name] - 56 y.o. female MRN [id number]  Date of birth: Jun 03, 1961  Office Visit Note: Visit Date: 04/27/2017 PCP: Garth Schlatter, MD Referred by: Garth Schlatter, MD  Subjective: Chief Complaint  Patient presents with  . Lower Back - Pain   HPI: [name] is a 56 year old female with complicated pain history in particularly low back pain. Please see our prior notes for further details of prior evaluation. She comes in today returning after having had facet joint blocks at L4-5 and L5-S1. She reports over 75% relief and was quite please with the amount of relief that she had. This did alleviate a lot of the pain she was having in the low back with standing and extension and movement. She reports that she did have some decrease in the swelling of her feet as well which is been a chronic issue. I did try to explain her that was not related directly to the spine although it could've been something to do with small amount of corticosteroid placed around the facet joints. She should look into this with her primary care physician or even Dr. Titus Dubin in terms of corticosteroid having the effect of reducing the swelling in her feet. She has not change any medications at this point she is doing well otherwise. She's having no radicular complaints. She has had an MRI showing facet arthropathy of the lower 2 levels. She has had physical therapy. The physical therapy notes are actually in Epic in the media section. These were dated in March and April 2016. She continues to have this chronic low back pain despite medication and time and therapy. She's had no new trauma. Patient has much less pain at rest and sitting    Review of Systems  Constitutional: Negative for chills, fever, malaise/fatigue and weight loss.  HENT: Negative for hearing loss and sinus pain.   Eyes: Negative for blurred vision, double vision and photophobia.  Respiratory: Negative for cough and shortness of breath.     Cardiovascular: Positive for leg swelling. Negative for chest pain and palpitations.  Gastrointestinal: Negative for abdominal pain, nausea and vomiting.  Genitourinary: Negative for flank pain.  Musculoskeletal: Positive for back pain. Negative for myalgias.  Skin: Negative for itching and rash.  Neurological: Negative for tremors, focal weakness and weakness.  Endo/Heme/Allergies: Negative.   Psychiatric/Behavioral: Negative for depression.  All other systems reviewed and are negative.  Otherwise per HPI.  Assessment & Plan: Visit Diagnoses:  1. Spondylosis without myelopathy or radiculopathy, lumbar region   2. Chronic pain syndrome     Plan: Findings:  Chronic worsening severe at times axial low back pain worse with standing and extension with MRI evidence of facet arthropathy without stenosis or disc herniation. She has no radicular complaints. Her symptoms seem to be facet joint mediated pain clinically and now diagnostically with 1 set of facet joint blocks. Within a complete a second set of medial branch blocks with pain diary. Depending on the relief she gets with this we would look at radiofrequency ablation. We will have her insurance company health team and manage preauthorize the second medial branch block. I'll ask that they hopefully expedite this and we can get her in in a timely fashion. She meets all the criteria in terms of radiofrequency ablation which includes chronic axial low back pain without radicular pain. No imaging findings of other issues other than facet joint arthropathy. She has failed conservative care including medication management and even opioids. She's had one  set of diagnostic blocks which were more than 75% effective. We'll see her back for the second set of blocks.    Meds & Orders: No orders of the defined types were placed in this encounter.  No orders of the defined types were placed in this encounter.   Follow-up: Return for Bilateral medial  branch blocks at L4-5 and L5-S1.   Procedures: No procedures performed  No notes on file   Clinical History: MRI LUMBAR SPINE WITHOUT CONTRAST  TECHNIQUE: Multiplanar, multisequence MR imaging of the lumbar spine was performed. No intravenous contrast was administered.  COMPARISON:  01/31/2014  FINDINGS: Segmentation:  Standard.  Alignment:  Physiologic.  Vertebrae:  No fracture, evidence of discitis, or bone lesion.  Conus medullaris: Extends to the T12 level and appears normal.  Paraspinal and other soft tissues: No paraspinal abnormality.  Disc levels:  Disc spaces: Degenerative disc disease disc height loss at T11-12, L1-2, L3-4, L4-5 and L5-S1.  T11-12: Small central disc protrusion. No foraminal or central canal stenosis.  T12-L1: No significant disc bulge. No evidence of neural foraminal stenosis. No central canal stenosis.  L1-L2: Broad-based disc bulge flattening the ventral thecal sac. Mild bilateral facet arthropathy. No evidence of neural foraminal stenosis. No central canal stenosis.  L2-L3: No significant disc bulge. No evidence of neural foraminal stenosis. No central canal stenosis.  L3-L4: Broad-based disc bulge flattening the ventral thecal sac and eccentric towards the left. Mild bilateral facet arthropathy. Mild left foraminal stenosis. No right foraminal stenosis. No central canal stenosis.  L4-L5: Mild broad-based disc bulge with a small left foraminal disc protrusion. Moderate bilateral facet arthropathy. Mild right and moderate left foraminal stenosis. Bilateral lateral recess narrowing. No central canal stenosis.  L5-S1: Mild broad-based disc bulge. Moderate bilateral facet arthropathy. Moderate bilateral foraminal narrowing. No central canal stenosis.  IMPRESSION: 1. Diffuse lumbar spine spondylosis as described above. 2. At L4-5 there is a mild broad-based disc bulge with a small left foraminal disc protrusion.  Moderate bilateral facet arthropathy. Mild right and moderate left foraminal stenosis. Bilateral lateral recess narrowing.   Electronically Signed   By: Elige Ko   On: 03/17/2017 14:58  She reports that she quit smoking about 8 years ago. She has never used smokeless tobacco. No results for input(s): HGBA1C, LABURIC in the last 8760 hours.  Objective:  VS:  HT:    WT:   BMI:     BP:131/70  HR:(!) 52bpm  TEMP: ( )  RESP:  Physical Exam  Constitutional: She is oriented to person, place, and time. She appears well-developed and well-nourished.  Obese  Eyes: Conjunctivae and EOM are normal. Pupils are equal, round, and reactive to light.  Cardiovascular: Normal rate and intact distal pulses.   Significant bilateral pedal edema  Pulmonary/Chest: Effort normal.  Musculoskeletal:  Patient ambulates with a slightly forward flexed spine. She has pain and stiffness with extension rotation. She has good distal strength without clonus. She does have 3+ edema bilaterally in the feet.  Neurological: She is alert and oriented to person, place, and time. She exhibits normal muscle tone.  Skin: Skin is warm and dry. No erythema.  Psychiatric: She has a normal mood and affect. Her behavior is normal.  Nursing note and vitals reviewed.   Ortho Exam Imaging: No results found.  Past Medical/Family/Surgical/Social History: Medications & Allergies reviewed per EMR Patient Active Problem List   Diagnosis Date Noted  . Failed total right knee replacement (HCC) 11/10/2012  . Obesity, Class III, BMI  40-49.9 (morbid obesity) (HCC) 11/10/2012  . Endometrial polyp 09/23/2012    Class: Present on Admission  . Hot flushes, perimenopausal 08/03/2012  . Menorrhagia 07/21/2012  . Menopause 07/13/2012  . Other and unspecified hyperlipidemia 07/13/2012  . DJD (degenerative joint disease) 07/13/2012  . Bereavement 07/13/2012  . Essential hypertension, benign 07/13/2012  . History of migraine  07/13/2012  . Hypothyroidism 07/13/2012   Past Medical History:  Diagnosis Date  . Arthritis    knees   . Hypertension    on meds, reviewed by Dr. Jacinto HalimGanji for cardiac clearance - 09/2013, told that she was cleared & no need to follow up with him   . Hypothyroidism   . Mental disorder    perimenopause use of Paxil  . Thyroid disease   . Total knee replacement status    Family History  Problem Relation Age of Onset  . Hyperlipidemia Mother   . Hypertension Mother   . Heart disease Mother   . Heart disease Father   . Cancer Father   . Kidney disease Father   . Heart disease Sister    Past Surgical History:  Procedure Laterality Date  . CARPAL TUNNEL RELEASE     2000, 2001, both hands  . DILATATION & CURRETTAGE/HYSTEROSCOPY WITH RESECTOCOPE  09/23/2012   Procedure: DILATATION & CURETTAGE/HYSTEROSCOPY WITH RESECTOCOPE;  Surgeon: Juluis MireJohn S McComb, MD;  Location: [facility];  Service: Gynecology;  Laterality: N/A;  . HAND SURGERY    . JOINT REPLACEMENT  2000, 2012,   knee replacement bilateral left-2000, 2012 right-2002  . TONSILLECTOMY    . TOTAL KNEE REVISION  11/08/2012   Procedure: TOTAL KNEE REVISION;  Surgeon: Valeria BatmanPeter W [name], MD;  [facility];  Service: Orthopedics;  Laterality: Right;  Revision Right Total Knee Replacement   . TUBAL LIGATION    . VAGINAL BIRTH AFTER CESAREAN SECTION     83, 85,87  . VAGINAL DELIVERY     [phone],85,87   Social History   Occupational History  . Not on file.   Social History Main Topics  . Smoking status: Former Smoker    Quit date: 07/13/2008  . Smokeless tobacco: Never Used  . Alcohol use No  . Drug use: No  . Sexual activity: Yes

## 2017-04-29 NOTE — Telephone Encounter (Signed)
Submitted auth request on [employer] website 

## 2017-05-05 ENCOUNTER — Other Ambulatory Visit (INDEPENDENT_AMBULATORY_CARE_PROVIDER_SITE_OTHER): Payer: Self-pay | Admitting: Physical Medicine and Rehabilitation

## 2017-05-05 DIAGNOSIS — G8929 Other chronic pain: Secondary | ICD-10-CM | POA: Diagnosis not present

## 2017-05-05 DIAGNOSIS — E669 Obesity, unspecified: Secondary | ICD-10-CM | POA: Diagnosis not present

## 2017-05-05 DIAGNOSIS — I10 Essential (primary) hypertension: Secondary | ICD-10-CM | POA: Diagnosis not present

## 2017-05-05 DIAGNOSIS — E039 Hypothyroidism, unspecified: Secondary | ICD-10-CM | POA: Diagnosis not present

## 2017-05-05 DIAGNOSIS — M545 Low back pain: Secondary | ICD-10-CM | POA: Diagnosis not present

## 2017-05-05 DIAGNOSIS — Z6841 Body Mass Index (BMI) 40.0 and over, adult: Secondary | ICD-10-CM | POA: Diagnosis not present

## 2017-05-05 DIAGNOSIS — R600 Localized edema: Secondary | ICD-10-CM | POA: Diagnosis not present

## 2017-05-05 DIAGNOSIS — E785 Hyperlipidemia, unspecified: Secondary | ICD-10-CM | POA: Diagnosis not present

## 2017-05-06 NOTE — Telephone Encounter (Signed)
Please advise 

## 2017-05-06 NOTE — Telephone Encounter (Signed)
Faxed

## 2017-05-11 ENCOUNTER — Encounter (INDEPENDENT_AMBULATORY_CARE_PROVIDER_SITE_OTHER): Payer: PPO | Admitting: Physical Medicine and Rehabilitation

## 2017-05-11 NOTE — Telephone Encounter (Signed)
Received auth for [id number] and [id number]. Auth# [id number]. eff 04/29/17-07/30/17.

## 2017-05-12 ENCOUNTER — Ambulatory Visit: Payer: PPO | Admitting: Rheumatology

## 2017-05-13 NOTE — Progress Notes (Addendum)
Office Visit Note  Patient: Margaret [name]             Date of Birth: 12/23/1960           MRN: [id number]             PCP: Eliott NineLeague-Sobon, Jennifer, MD Referring: Garth SchlatterAmeen, William Otis, MD Visit Date: 05/17/2017 Occupation: @GUAROCC @  ################################################################  Subjective:  Generalized pain   History of Present Illness: Margaret [name] is a 56 y.o. female with history of generalized pain has been seen in consultation per request of Dr. Alvester MorinNewton. According to patient she's had left shoulder joint pain since 2007 off-and-on. She states she had one-time cortisone injection by Dr. Brynda Greathouseandall in her left shoulder joint. She's been also experiencing lower back pain for the last 3 years and has been under care of Dr. Alvester MorinNewton. She's had injections in her lower back as well. She's been also experiencing generalized overall pain. She states all of her joints are painful especially in her hands and knee joints. She also experiences generalized muscle pain tingling sensation. She's been experiencing a lot of fatigue. She states gabapentin helps her to sleep at night but it is cost weight gain. She has not noticed much improvement with the tramadol. She states Celebrex helped to some extent. She still reports that she experiences off-and-on swelling in her hands knees and her feet.  Activities of Daily Living:  Patient reports morning stiffness for 1 hour.   Patient Reports nocturnal pain.  Difficulty dressing/grooming: Reports Difficulty climbing stairs: Reports Difficulty getting out of chair: Reports Difficulty using hands for taps, buttons, cutlery, and/or writing: Denies   Review of Systems  Constitutional: Positive for fatigue. Negative for night sweats, weight gain, weight loss and weakness.  HENT: Positive for mouth dryness. Negative for mouth sores, trouble swallowing, trouble swallowing and nose dryness.   Eyes: Negative for pain, redness, visual  disturbance and dryness.  Respiratory: Negative for cough, shortness of breath and difficulty breathing.   Cardiovascular: Negative for chest pain, palpitations, hypertension, irregular heartbeat and swelling in legs/feet.  Gastrointestinal: Negative for blood in stool, constipation and diarrhea.  Endocrine: Negative for increased urination.  Genitourinary: Negative for vaginal dryness.  Musculoskeletal: Positive for arthralgias, joint pain, myalgias, morning stiffness and myalgias. Negative for joint swelling, muscle weakness and muscle tenderness.  Skin: Negative for color change, rash, hair loss, skin tightness, ulcers and sensitivity to sunlight.  Allergic/Immunologic: Negative for susceptible to infections.  Neurological: Negative for dizziness, memory loss and night sweats.  Hematological: Negative for swollen glands.  Psychiatric/Behavioral: Positive for depressed mood and sleep disturbance. The patient is nervous/anxious.     PMFS History:  Patient Active Problem List   Diagnosis Date Noted  . Status post total knee replacement, bilateral 05/17/2017  . DDD (degenerative disc disease), lumbar 05/17/2017  . Failed total right knee replacement (HCC) 11/10/2012  . Obesity, Class III, BMI 40-49.9 (morbid obesity) (HCC) 11/10/2012  . Endometrial polyp 09/23/2012    Class: Present on Admission  . Hot flushes, perimenopausal 08/03/2012  . Menorrhagia 07/21/2012  . Menopause 07/13/2012  . Other and unspecified hyperlipidemia 07/13/2012  . DJD (degenerative joint disease) 07/13/2012  . Bereavement 07/13/2012  . Essential hypertension, benign 07/13/2012  . History of migraine 07/13/2012  . Hypothyroidism 07/13/2012    Past Medical History:  Diagnosis Date  . Arthritis    knees   . Hypertension    on meds, reviewed by Dr. Jacinto HalimGanji for cardiac clearance - 09/2013, told that  she was cleared & no need to follow up with him   . Hypothyroidism   . Mental disorder    perimenopause use of  Paxil  . Thyroid disease   . Total knee replacement status     Family History  Problem Relation Age of Onset  . Hyperlipidemia Mother   . Hypertension Mother   . Heart disease Mother   . Kidney disease Mother   . Rheumatologic disease Mother   . Heart disease Father   . Cancer Father   . Kidney disease Father   . Heart disease Sister   . Hypertension Sister    Past Surgical History:  Procedure Laterality Date  . CARPAL TUNNEL RELEASE     2000, 2001, both hands  . DILATATION & CURRETTAGE/HYSTEROSCOPY WITH RESECTOCOPE  09/23/2012   Procedure: DILATATION & CURETTAGE/HYSTEROSCOPY WITH RESECTOCOPE;  Surgeon: Juluis MireJohn S McComb, MD;  Location: [facility];  Service: Gynecology;  Laterality: N/A;  . HAND SURGERY    . JOINT REPLACEMENT  2000, 2012,   knee replacement bilateral left-2000, 2012 right-2002  . TONSILLECTOMY    . TOTAL KNEE REVISION  11/08/2012   Procedure: TOTAL KNEE REVISION;  Surgeon: Valeria BatmanPeter W [name], MD;  [facility];  Service: Orthopedics;  Laterality: Right;  Revision Right Total Knee Replacement   . TUBAL LIGATION    . VAGINAL BIRTH AFTER CESAREAN SECTION     83, 85,87  . VAGINAL DELIVERY     [phone],85,87   Social History   Social History Narrative  . No narrative on file     Objective: Vital Signs: BP (!) 132/54 (BP Location: Left Arm, Patient Position: Sitting, Cuff Size: Normal)   Pulse (!) 55   Ht 5\' 6"  (1.676 m)   Wt 298 lb (135.2 kg)   BMI 48.10 kg/m    Physical Exam  Constitutional: She is oriented to person, place, and time. She appears well-developed and well-nourished.  HENT:  Head: Normocephalic and atraumatic.  Eyes: Conjunctivae and EOM are normal.  Neck: Normal range of motion.  Cardiovascular: Normal rate, regular rhythm, normal heart sounds and intact distal pulses.   Pulmonary/Chest: Effort normal and breath sounds normal.  Abdominal: Soft. Bowel sounds are normal.  Lymphadenopathy:    She has no cervical adenopathy.  Neurological: She is  alert and oriented to person, place, and time.  Skin: Skin is warm and dry. Capillary refill takes less than 2 seconds.  Psychiatric: She has a normal mood and affect. Her behavior is normal.  Nursing note and vitals reviewed.    Musculoskeletal Exam: C-spine and thoracic spine good range of motion. She has painful limited range of motion of her lumbar spine. Shoulder joints elbow joints wrist joint MCPs PIPs DIPs with good range of motion with no synovitis. She has some thickening of PIP/DIP joints consistent with osteoarthritis. Hip joints are good range of motion. She has bilateral total knee replacement which some discomfort. She also has some pitting edema. No MTPs PIP changes were noted.  CDAI Exam: No CDAI exam completed.    Investigation: No additional findings.   Imaging: Xr Hand 2 View Left  Result Date: 05/17/2017 No MCP joint narrowing was noted. PIP/DIP narrowing was noted. No intercarpal joint space narrowing was noted. Impression: These findings are consistent with osteoarthritis of the hand.  Xr Hand 2 View Right  Result Date: 05/17/2017 No MCP joint narrowing was noted. PIP/DIP narrowing was noted. No intercarpal joint space narrowing was noted. Impression: These findings are consistent  with osteoarthritis of the hand.   Speciality Comments: No specialty comments available.    Procedures:  No procedures performed Allergies: Codeine   Assessment / Plan:     Visit Diagnoses: Generalized pain - On gabapentin, tramadol, and Celebrex - Plan: Sedimentation rate. Patient reports weight gain on gabapentin and the only benefit she is having from that is treatment of insomnia. On exam she does have generalized pain hyperalgesia positive tender points which goes along with fibromyalgia syndrome.. Detailed discussion regarding that. I believe that she may benefit from switching to Cymbalta as she does have some underlying depression and if she prefers to taper off Neurontin  she can. She believes the tramadol is not effective. Celebrex helps her to some extent. I've advised her to discuss change in treatment with either Dr. Alvester Morin or her PCP as she would not be seeing me on a long-term basis I do not want to start her on any medication.  Insomnia: Patient is taking Neurontin for insomnia. I believe tizanidine may be a good option for her to take at bedtime that. Release some of the muscle spasm and also help with insomnia.  Other fatigue - Plan: CBC with Differential/Platelet, COMPLETE METABOLIC PANEL WITH GFR, CK, TSH, Serum protein electrophoresis with reflex  Pain in both hands . I do not see any synovitis on examination today.- Plan: XR Hand 2 View Right, XR Hand 2 View Left,. X-rays revealed only mild osteoarthritic changes today. ANA, Rheumatoid factor, Cyclic citrul peptide antibody, IgG, Uric acid  Status post total knee replacement, bilateral: She has chronic pain  Neck pain  DDD (degenerative disc disease), lumbar: She is ongoing discomfort in her lower back.  Obesity, Class III, BMI 40-49.9 (morbid obesity) (HCC)  Other specified hypothyroidism  History of migraine  Essential hypertension, benign  Bereavement - Her husband has lung cancer    Orders: Orders Placed This Encounter  Procedures  . XR Hand 2 View Right  . XR Hand 2 View Left  . CBC with Differential/Platelet  . COMPLETE METABOLIC PANEL WITH GFR  . Sedimentation rate  . CK  . TSH  . ANA  . Rheumatoid factor  . Cyclic citrul peptide antibody, IgG  . Uric acid  . Serum protein electrophoresis with reflex   No orders of the defined types were placed in this encounter.   Face-to-face time spent with patient was 45  minutes. Greater than 50% of time was spent in counseling and coordination of care.  Follow-Up Instructions: Return for DDD, polyarthralgia, myalgia.   Pollyann Savoy, MD  Note - This record has been created using Animal nutritionist.  Chart creation errors  have been sought, but may not always  have been located. Such creation errors do not reflect on  the standard of medical care.

## 2017-05-17 ENCOUNTER — Ambulatory Visit (INDEPENDENT_AMBULATORY_CARE_PROVIDER_SITE_OTHER): Payer: PPO | Admitting: Rheumatology

## 2017-05-17 ENCOUNTER — Ambulatory Visit (INDEPENDENT_AMBULATORY_CARE_PROVIDER_SITE_OTHER): Payer: PPO

## 2017-05-17 ENCOUNTER — Encounter: Payer: Self-pay | Admitting: Rheumatology

## 2017-05-17 VITALS — BP 132/54 | HR 55 | Ht 66.0 in | Wt 298.0 lb

## 2017-05-17 DIAGNOSIS — R52 Pain, unspecified: Secondary | ICD-10-CM | POA: Diagnosis not present

## 2017-05-17 DIAGNOSIS — I10 Essential (primary) hypertension: Secondary | ICD-10-CM

## 2017-05-17 DIAGNOSIS — Z96653 Presence of artificial knee joint, bilateral: Secondary | ICD-10-CM

## 2017-05-17 DIAGNOSIS — R5383 Other fatigue: Secondary | ICD-10-CM

## 2017-05-17 DIAGNOSIS — M79642 Pain in left hand: Secondary | ICD-10-CM

## 2017-05-17 DIAGNOSIS — M79641 Pain in right hand: Secondary | ICD-10-CM

## 2017-05-17 DIAGNOSIS — M51369 Other intervertebral disc degeneration, lumbar region without mention of lumbar back pain or lower extremity pain: Secondary | ICD-10-CM | POA: Insufficient documentation

## 2017-05-17 DIAGNOSIS — M5136 Other intervertebral disc degeneration, lumbar region: Secondary | ICD-10-CM

## 2017-05-17 DIAGNOSIS — M542 Cervicalgia: Secondary | ICD-10-CM | POA: Diagnosis not present

## 2017-05-17 DIAGNOSIS — E038 Other specified hypothyroidism: Secondary | ICD-10-CM | POA: Diagnosis not present

## 2017-05-17 DIAGNOSIS — Z634 Disappearance and death of family member: Secondary | ICD-10-CM

## 2017-05-17 DIAGNOSIS — Z8669 Personal history of other diseases of the nervous system and sense organs: Secondary | ICD-10-CM | POA: Diagnosis not present

## 2017-05-17 LAB — COMPLETE METABOLIC PANEL WITH GFR
ALT: 9 U/L (ref 6–29)
AST: 11 U/L (ref 10–35)
Albumin: 3.9 g/dL (ref 3.6–5.1)
Alkaline Phosphatase: 45 U/L (ref 33–130)
BUN: 16 mg/dL (ref 7–25)
CO2: 25 mmol/L (ref 20–31)
Calcium: 9.3 mg/dL (ref 8.6–10.4)
Chloride: 99 mmol/L (ref 98–110)
Creat: 0.62 mg/dL (ref 0.50–1.05)
GFR, Est African American: >89 mL/min (ref >=60)
GFR, Est Non African American: >89 mL/min (ref >=60)
Glucose, Bld: 89 mg/dL (ref 65–99)
Potassium: 4.2 mmol/L (ref 3.5–5.3)
Sodium: 139 mmol/L (ref 135–146)
Total Bilirubin: 0.3 mg/dL (ref 0.2–1.2)
Total Protein: 6.4 g/dL (ref 6.1–8.1)

## 2017-05-17 LAB — CBC WITH DIFFERENTIAL/PLATELET
Basophils Absolute: 77 cells/uL (ref 0–200)
Basophils Relative: 1 %
Eosinophils Absolute: 154 cells/uL (ref 15–500)
Eosinophils Relative: 2 %
HCT: 38.2 % (ref 35.0–45.0)
Hemoglobin: 12.7 g/dL (ref 11.7–15.5)
Lymphocytes Relative: 24 %
Lymphs Abs: 1848 cells/uL (ref 850–3900)
MCH: 29.1 pg (ref 27.0–33.0)
MCHC: 33.2 g/dL (ref 32.0–36.0)
MCV: 87.6 fL (ref 80.0–100.0)
MPV: 9.3 fL (ref 7.5–12.5)
Monocytes Absolute: 539 cells/uL (ref 200–950)
Monocytes Relative: 7 %
Neutro Abs: 5082 cells/uL (ref 1500–7800)
Neutrophils Relative %: 66 %
Platelets: 304 10*3/uL (ref 140–400)
RBC: 4.36 MIL/uL (ref 3.80–5.10)
RDW: 12.9 % (ref 11.0–15.0)
WBC: 7.7 10*3/uL (ref 3.8–10.8)

## 2017-05-17 LAB — TSH: TSH: 4.2 mIU/L

## 2017-05-18 LAB — CK: Total CK: 44 U/L (ref 29–143)

## 2017-05-18 LAB — RHEUMATOID FACTOR: Rhuematoid fact SerPl-aCnc: 17 IU/mL — ABNORMAL HIGH (ref <14)

## 2017-05-18 LAB — SEDIMENTATION RATE: Sed Rate: 4 mm/hr (ref 0–30)

## 2017-05-18 LAB — CYCLIC CITRUL PEPTIDE ANTIBODY, IGG: Cyclic Citrullin Peptide Ab: <16 Units

## 2017-05-18 LAB — URIC ACID: Uric Acid, Serum: 6.1 mg/dL (ref 2.5–7.0)

## 2017-05-18 LAB — ANA: Anti Nuclear Antibody(ANA): NEGATIVE

## 2017-05-20 LAB — PROTEIN ELECTROPHORESIS, SERUM, WITH REFLEX
Albumin ELP: 3.9 g/dL (ref 3.8–4.8)
Alpha-1-Globulin: 0.3 g/dL (ref 0.2–0.3)
Alpha-2-Globulin: 0.6 g/dL (ref 0.5–0.9)
Beta 2: 0.4 g/dL (ref 0.2–0.5)
Beta Globulin: 0.6 g/dL (ref 0.4–0.6)
Gamma Globulin: 0.8 g/dL (ref 0.8–1.7)
Total Protein, Serum Electrophoresis: 6.5 g/dL (ref 6.1–8.1)

## 2017-05-20 NOTE — Progress Notes (Signed)
RF low titer positive probably not significant. If she is having discomfort in her hands place a schedule ultrasound of bilateral hands to rule out synovitis.

## 2017-05-24 ENCOUNTER — Ambulatory Visit (INDEPENDENT_AMBULATORY_CARE_PROVIDER_SITE_OTHER): Payer: PPO | Admitting: Physical Medicine and Rehabilitation

## 2017-05-24 ENCOUNTER — Ambulatory Visit (INDEPENDENT_AMBULATORY_CARE_PROVIDER_SITE_OTHER): Payer: PPO

## 2017-05-24 ENCOUNTER — Encounter (INDEPENDENT_AMBULATORY_CARE_PROVIDER_SITE_OTHER): Payer: Self-pay | Admitting: Physical Medicine and Rehabilitation

## 2017-05-24 VITALS — BP 139/72 | HR 53

## 2017-05-24 DIAGNOSIS — M47816 Spondylosis without myelopathy or radiculopathy, lumbar region: Secondary | ICD-10-CM | POA: Diagnosis not present

## 2017-05-24 DIAGNOSIS — G894 Chronic pain syndrome: Secondary | ICD-10-CM

## 2017-05-24 MED ORDER — LIDOCAINE HCL (PF) 1 % IJ SOLN
2.0000 mL | Freq: Once | INTRAMUSCULAR | Status: AC
  Administered 2017-05-24: 2 mL

## 2017-05-24 MED ORDER — BUPIVACAINE HCL 0.5 % IJ SOLN
3.0000 mL | Freq: Once | INTRAMUSCULAR | Status: AC
Start: 2017-05-24 — End: 2017-05-24
  Administered 2017-05-24: 3 mL

## 2017-05-24 NOTE — Patient Instructions (Signed)

## 2017-05-24 NOTE — Progress Notes (Deleted)
Patient is here today for planned bilateral medial branch blocks. No change in symptoms. 

## 2017-05-25 NOTE — Procedures (Signed)
[name] is a 56 year old female with history of chronic widespread body pain but more focally axial low back pain worse with standing and twisting. She has had physical therapy medication management without relief. She has been in pain management before. We completed diagnostic medial branch blocks of the L4-5 and L5-S1 facet joints and these were diagnostic and gave her more than 60-70% relief short-term. We are going to complete a second diagnostic block today with pain diary. She does well with that we would consider radiofrequency ablation. She has seen Dr. Titus Dubinevashwar since I have seen her last for evaluation of rheumatologic disease. Dr. Titus Dubinevashwar did diagnose with osteoarthritis but there is still lab work pending. The patient says that she may indeed have fibromyalgia.  Lumbar Diagnostic Facet Joint Nerve Block with Fluoroscopic Guidance   Patient: Margaret [name]      Date of Birth: 10/02/1961 MRN: [id number] PCP: Eliott NineLeague-Sobon, Jennifer, MD      Visit Date: 05/24/2017   Universal Protocol:    Date/Time: 08/07/185:52 AM  Consent Given By: the patient  Position: PRONE  Additional Comments: Vital signs were monitored before and after the procedure. Patient was prepped and draped in the usual sterile fashion. The correct patient, procedure, and site was verified.   Injection Procedure Details:  Procedure Site One Meds Administered:  Meds ordered this encounter  Medications  . lidocaine (PF) (XYLOCAINE) 1 % injection 2 mL  . bupivacaine (MARCAINE) 0.5 % (with pres) injection 3 mL     Laterality: Bilateral  Location/Site: L3 and L4 medial branches and L5 dorsal rami L4-L5 L5-S1  Needle size: 22 G  Needle type: spinal needle  Needle Placement: Oblique pedical  Findings:  -Contrast Used: 1 mL iohexol 180 mg iodine/mL   -Comments: It was excellent flow of contrast along the articular pillars without intravascular flow.  Procedure Details: The fluoroscope beam is  vertically oriented in AP and then obliqued 15 to 20 degrees to the ipsilateral side of the desired nerve to achieve the "Scotty dog" appearance.  The skin over the target area of the junction of the superior articulating process and the transverse process (sacral ala if blocking the L5 dorsal rami) was locally anesthetized with a 1 ml volume of 1% Lidocaine without Epinephrine.  The spinal needle was inserted and advanced in a trajectory view down to the target.   After contact with periosteum and negative aspirate for blood and CSF, correct placement without intravascular or epidural spread was confirmed by injecting 0.5 ml. of Isovue-250.  A spot radiograph was obtained of this image.    Next, a 0.5 ml. volume of the injectate described above was injected. The needle was then redirected to the other facet joint nerves mentioned above if needed.  Prior to the procedure, the patient was given a Pain Diary which was completed for baseline measurements.  After the procedure, the patient rated their pain every 30 minutes and will continue rating at this frequency for a total of 5 hours.  The patient has been asked to complete the Diary and return to us by mail, fax or hand delivered as soon as possible.   Additional Comments:  The patient tolerated the procedure well Dressing: Band-Aid    Post-procedure details: Patient was observed during the procedure. Post-procedure instructions were reviewed.  Patient left the clinic in stable condition.

## 2017-06-01 ENCOUNTER — Other Ambulatory Visit (INDEPENDENT_AMBULATORY_CARE_PROVIDER_SITE_OTHER): Payer: Self-pay | Admitting: Physical Medicine and Rehabilitation

## 2017-06-01 NOTE — Telephone Encounter (Signed)
Please advise 

## 2017-06-10 NOTE — Progress Notes (Signed)
Office Visit Note  Patient: Margaret [name]             Date of Birth: Jul 10, 1961           MRN: [id number]             PCP: Daphene Calamity, MD Referring: Laurel Dimmer, MD Visit Date: 06/15/2017 Occupation: _0 @    Subjective:  Pain in multiple joints.   History of Present Illness: Margaret [name] is a 56 y.o. female with history of osteoarthritis and disc disease. She continues to have a lot of pain and discomfort. She describes pain in her bilateral hands, bilateral knee joints bilateral shoulders and lower back. She continues to have some swelling in her knee joints and also in her hands.  Activities of Daily Living:  Patient reports morning stiffness for 30 minutes.   Patient Reports nocturnal pain. Lower back and knee joints Difficulty dressing/grooming: Denies Difficulty climbing stairs: Reports Difficulty getting out of chair: Reports Difficulty using hands for taps, buttons, cutlery, and/or writing: Denies   Review of Systems  Constitutional: Positive for fatigue. Negative for night sweats, weight gain, weight loss and weakness.  HENT: Positive for mouth dryness. Negative for mouth sores, trouble swallowing, trouble swallowing and nose dryness.   Eyes: Negative for pain, redness, visual disturbance and dryness.  Respiratory: Negative for cough, shortness of breath and difficulty breathing.   Cardiovascular: Positive for hypertension. Negative for chest pain, palpitations, irregular heartbeat and swelling in legs/feet.  Gastrointestinal: Negative for blood in stool, constipation and diarrhea.  Endocrine: Negative for increased urination.  Genitourinary: Negative for vaginal dryness.  Musculoskeletal: Positive for arthralgias, joint pain, joint swelling and morning stiffness. Negative for myalgias, muscle weakness, muscle tenderness and myalgias.  Skin: Negative for color change, rash, hair loss, skin tightness, ulcers and sensitivity to sunlight.    Allergic/Immunologic: Negative for susceptible to infections.  Neurological: Negative for dizziness, memory loss and night sweats.  Hematological: Negative for swollen glands.  Psychiatric/Behavioral: Positive for sleep disturbance. Negative for depressed mood. The patient is not nervous/anxious.     PMFS History:  Patient Active Problem List   Diagnosis Date Noted  . Primary osteoarthritis of both hands 06/14/2017  . Status post total knee replacement, bilateral 05/17/2017  . DDD (degenerative disc disease), lumbar 05/17/2017  . Failed total right knee replacement ([facility]) 11/10/2012  . Obesity, Class III, BMI 40-49.9 (morbid obesity) ([facility]) 11/10/2012  . Endometrial polyp 09/23/2012    Class: Present on Admission  . Hot flushes, perimenopausal 08/03/2012  . Menorrhagia 07/21/2012  . Menopause 07/13/2012  . Other and unspecified hyperlipidemia 07/13/2012  . Bereavement 07/13/2012  . Essential hypertension, benign 07/13/2012  . History of migraine 07/13/2012  . Hypothyroidism 07/13/2012    Past Medical History:  Diagnosis Date  . Arthritis    knees   . Hypertension    on meds, reviewed by Dr. Einar Gip for cardiac clearance - 09/2013, told that she was cleared & no need to follow up with him   . Hypothyroidism   . Mental disorder    perimenopause use of Paxil  . Thyroid disease   . Total knee replacement status     Family History  Problem Relation Age of Onset  . Hyperlipidemia Mother   . Hypertension Mother   . Heart disease Mother   . Kidney disease Mother   . Rheumatologic disease Mother   . Heart disease Father   . Cancer Father   . Heart disease Sister   .  Hypertension Sister    Past Surgical History:  Procedure Laterality Date  . CARPAL TUNNEL RELEASE     2000, 2001, both hands  . DILATATION & CURRETTAGE/HYSTEROSCOPY WITH RESECTOCOPE  09/23/2012   Procedure: DILATATION & CURETTAGE/HYSTEROSCOPY WITH RESECTOCOPE;  Surgeon: Darlyn Chamber, MD;  Location: [facility];   Service: Gynecology;  Laterality: N/A;  . HAND SURGERY    . JOINT REPLACEMENT  2000, 2012,   knee replacement bilateral left-2000, 2012 right-2002  . TONSILLECTOMY    . TOTAL KNEE REVISION  11/08/2012   Procedure: TOTAL KNEE REVISION;  Surgeon: Garald Balding, MD;  Location: [facility];  Service: Orthopedics;  Laterality: Right;  Revision Right Total Knee Replacement   . TUBAL LIGATION    . VAGINAL BIRTH AFTER CESAREAN SECTION     84, 85,88  . VAGINAL DELIVERY     [phone]   Social History   Social History Narrative  . No narrative on file     Objective: Vital Signs: BP (!) 145/61 (BP Location: Left Arm, Patient Position: Sitting, Cuff Size: Normal)   Pulse (!) 50   Ht _0  (1.676 m)   Wt (!) 304 lb (137.9 kg)   BMI 49.07 kg/m    Physical Exam  Constitutional: She is oriented to person, place, and time. She appears well-developed and well-nourished.  HENT:  Head: Normocephalic and atraumatic.  Eyes: Conjunctivae and EOM are normal.  Neck: Normal range of motion.  Cardiovascular: Normal rate, regular rhythm, normal heart sounds and intact distal pulses.   Pulmonary/Chest: Effort normal and breath sounds normal.  Abdominal: Soft. Bowel sounds are normal.  Lymphadenopathy:    She has no cervical adenopathy.  Neurological: She is alert and oriented to person, place, and time.  Skin: Skin is warm and dry. Capillary refill takes less than 2 seconds.  Psychiatric: She has a normal mood and affect. Her behavior is normal.  Nursing note and vitals reviewed.    Musculoskeletal Exam: C-spine and thoracic good range of motion. She has limited  range of motion of her lumbar spine with some discomfort. Shoulder joints elbow joints are good range of motion. She is tenderness on palpation of her wrist joint and MCP joints. But no synovitis was noted. She also has some osteoarthritic changes in her hands with DIP PIP thickening. Hip joints knee joints are good range of motion. She has  bilateral total knee replacement is doing well.  CDAI Exam: No CDAI exam completed.    Investigation: No additional findings. 05/17/2017 CBC normal, CMP normal, ESR 4, CK 44, TSH normal, RF 17 (less than 14) positive, anti-CCP negative, ANA negative, uric acid 6.1, SPEP normal  Imaging: [place] Extrem Up Bilat Comp  Result Date: 06/15/2017 Ultrasound examination of bilateral hands was performed per EULAR recommendations. Using 12 MHz transducer, grayscale and power Doppler bilateral second, third, and fifth MCP joints and bilateral wrist joints both dorsal and volar aspects were evaluated to look for synovitis or tenosynovitis. The findings were there was  synovitis right second third and fifth, left third and bilateral wrist joints on ultrasound examination. Right median nerve was 0.16 cm squares which was more than upper limits of normal and left median nerve was 0.15 cm squares which was more than upper limits of normal. Impression: Ultrasound examination was consistent with inflammatory arthritis and enlarged bilateral median nerves.  Xr C-arm No Report  Result Date: 05/24/2017 Please see Notes or Procedures tab for imaging impression.  Xr Hand 2 View Left  Result Date: 05/17/2017 No MCP joint narrowing was noted. PIP/DIP narrowing was noted. No intercarpal joint space narrowing was noted. Impression: These findings are consistent with osteoarthritis of the hand.  Xr Hand 2 View Right  Result Date: 05/17/2017 No MCP joint narrowing was noted. PIP/DIP narrowing was noted. No intercarpal joint space narrowing was noted. Impression: These findings are consistent with osteoarthritis of the hand.   Speciality Comments: No specialty comments available.    Procedures:  No procedures performed Allergies: Codeine   Assessment / Plan:     Visit Diagnoses: Rheumatoid arthritis with rheumatoid factor of multiple sites without organ or systems involvement (HCC) positive RF, negative anti-CCP,  positive synovitis on the ultrasound examination. She's been having arthralgias. He had detailed discussion regarding different treatment options and their side effects. After indications side effects contraindications of Plaquenil were discussed. A handout was given and consent was obtained. The plan is to start her on Plaquenil 200 mg 1 by mouth twice a day Monday to Friday. She will need labs in a month then every 3 months to monitor for drug toxicity once the labs are stable we can check them every 5 months. She will need a baseline eye exams and yearly eye exam after that.  Pain in both hands - Plan: [place] Extrem Up Bilat Comp: She has a large median nerves she's not experiencing any symptoms of carpal tunnel syndrome.  Primary osteoarthritis of both hands - RF 17 (<14), CCP Ab -. Plan [place] bilateral hands.  Status post total knee replacement, bilateral: She continues to have some discomfort in her knee joints.  Neck pain: Chronic pain  DDD (degenerative disc disease), lumbar: Chronic pain  Myalgia  Other fatigue  Primary insomnia  History of migraine  Essential hypertension, benign  Obesity, Class III, BMI 40-49.9 (morbid obesity) ([facility])    Orders: Orders Placed This Encounter  Procedures  . [place] Extrem Up Bilat Comp   Meds ordered this encounter  Medications  . hydroxychloroquine (PLAQUENIL) 200 MG tablet    Sig: Take 1 tablet by mouth twice daily Monday- Friday    Dispense:  40 tablet    Refill:  2    Face-to-face time spent with patient was 30 minutes. Greater than 50% of time was spent in counseling and coordination of care.  Follow-Up Instructions: Return in about 3 months (around 09/15/2017) for Osteoarthritis, Rheumatoid arthritisDDD.   Bo Merino, MD  Note - This record has been created using Editor, commissioning.  Chart creation errors have been sought, but may not always  have been located. Such creation errors do not reflect on  the standard of medical  care.

## 2017-06-11 ENCOUNTER — Telehealth (INDEPENDENT_AMBULATORY_CARE_PROVIDER_SITE_OTHER): Payer: Self-pay | Admitting: Physical Medicine and Rehabilitation

## 2017-06-14 DIAGNOSIS — M19042 Primary osteoarthritis, left hand: Secondary | ICD-10-CM

## 2017-06-14 DIAGNOSIS — M19041 Primary osteoarthritis, right hand: Secondary | ICD-10-CM | POA: Insufficient documentation

## 2017-06-14 MED ORDER — DIAZEPAM 5 MG PO TABS: ORAL_TABLET | ORAL | 0 refills | Status: DC

## 2017-06-14 NOTE — Telephone Encounter (Signed)
Prescription faxed and patient notified.

## 2017-06-14 NOTE — Telephone Encounter (Signed)
Please fax, printed 

## 2017-06-14 NOTE — Telephone Encounter (Signed)
Rx for pre-procedure valium

## 2017-06-15 ENCOUNTER — Ambulatory Visit (INDEPENDENT_AMBULATORY_CARE_PROVIDER_SITE_OTHER): Payer: PPO | Admitting: Physical Medicine and Rehabilitation

## 2017-06-15 ENCOUNTER — Inpatient Hospital Stay (INDEPENDENT_AMBULATORY_CARE_PROVIDER_SITE_OTHER): Payer: Self-pay

## 2017-06-15 ENCOUNTER — Ambulatory Visit (INDEPENDENT_AMBULATORY_CARE_PROVIDER_SITE_OTHER): Payer: PPO

## 2017-06-15 ENCOUNTER — Encounter: Payer: Self-pay | Admitting: Rheumatology

## 2017-06-15 ENCOUNTER — Ambulatory Visit (INDEPENDENT_AMBULATORY_CARE_PROVIDER_SITE_OTHER): Payer: PPO | Admitting: Rheumatology

## 2017-06-15 ENCOUNTER — Encounter (INDEPENDENT_AMBULATORY_CARE_PROVIDER_SITE_OTHER): Payer: Self-pay | Admitting: Physical Medicine and Rehabilitation

## 2017-06-15 VITALS — BP 126/49 | HR 57

## 2017-06-15 VITALS — BP 145/61 | HR 50 | Ht 66.0 in | Wt 304.0 lb

## 2017-06-15 DIAGNOSIS — M5136 Other intervertebral disc degeneration, lumbar region: Secondary | ICD-10-CM | POA: Diagnosis not present

## 2017-06-15 DIAGNOSIS — M0579 Rheumatoid arthritis with rheumatoid factor of multiple sites without organ or systems involvement: Secondary | ICD-10-CM

## 2017-06-15 DIAGNOSIS — R5383 Other fatigue: Secondary | ICD-10-CM | POA: Diagnosis not present

## 2017-06-15 DIAGNOSIS — G894 Chronic pain syndrome: Secondary | ICD-10-CM

## 2017-06-15 DIAGNOSIS — I10 Essential (primary) hypertension: Secondary | ICD-10-CM

## 2017-06-15 DIAGNOSIS — M542 Cervicalgia: Secondary | ICD-10-CM | POA: Diagnosis not present

## 2017-06-15 DIAGNOSIS — M47816 Spondylosis without myelopathy or radiculopathy, lumbar region: Secondary | ICD-10-CM

## 2017-06-15 DIAGNOSIS — F5101 Primary insomnia: Secondary | ICD-10-CM

## 2017-06-15 DIAGNOSIS — M79641 Pain in right hand: Secondary | ICD-10-CM

## 2017-06-15 DIAGNOSIS — Z96653 Presence of artificial knee joint, bilateral: Secondary | ICD-10-CM | POA: Diagnosis not present

## 2017-06-15 DIAGNOSIS — M791 Myalgia, unspecified site: Secondary | ICD-10-CM

## 2017-06-15 DIAGNOSIS — M19041 Primary osteoarthritis, right hand: Secondary | ICD-10-CM

## 2017-06-15 DIAGNOSIS — M79642 Pain in left hand: Secondary | ICD-10-CM

## 2017-06-15 DIAGNOSIS — M19042 Primary osteoarthritis, left hand: Secondary | ICD-10-CM

## 2017-06-15 DIAGNOSIS — Z8669 Personal history of other diseases of the nervous system and sense organs: Secondary | ICD-10-CM

## 2017-06-15 MED ORDER — BETAMETHASONE SOD PHOS & ACET 6 (3-3) MG/ML IJ SUSP
12.0000 mg | Freq: Once | INTRAMUSCULAR | Status: AC
  Administered 2017-06-15: 12 mg

## 2017-06-15 MED ORDER — LIDOCAINE HCL (PF) 1 % IJ SOLN
2.0000 mL | Freq: Once | INTRAMUSCULAR | Status: AC
  Administered 2017-06-15: 2 mL

## 2017-06-15 MED ORDER — HYDROXYCHLOROQUINE SULFATE 200 MG PO TABS: ORAL_TABLET | ORAL | 2 refills | Status: DC

## 2017-06-15 NOTE — Patient Instructions (Signed)
Hydroxychloroquine tablets What is this medicine? HYDROXYCHLOROQUINE (hye drox ee KLOR oh kwin) is used to treat rheumatoid arthritis and systemic lupus erythematosus. It is also used to treat malaria. This medicine may be used for other purposes; ask your health care provider or pharmacist if you have questions. COMMON BRAND NAME(S): Plaquenil, Quineprox What should I tell my health care provider before I take this medicine? They need to know if you have any of these conditions: -diabetes -eye disease, vision problems -G6PD deficiency -history of blood diseases -history of irregular heartbeat -if you often drink alcohol -kidney disease -liver disease -porphyria -psoriasis -seizures -an unusual or allergic reaction to chloroquine, hydroxychloroquine, other medicines, foods, dyes, or preservatives -pregnant or trying to get pregnant -breast-feeding How should I use this medicine? Take this medicine by mouth with a glass of water. Follow the directions on the prescription label. Avoid taking antacids within 4 hours of taking this medicine. It is best to separate these medicines by at least 4 hours. Do not cut, crush or chew this medicine. You can take it with or without food. If it upsets your stomach, take it with food. Take your medicine at regular intervals. Do not take your medicine more often than directed. Take all of your medicine as directed even if you think you are better. Do not skip doses or stop your medicine early. Talk to your pediatrician regarding the use of this medicine in children. While this drug may be prescribed for selected conditions, precautions do apply. Overdosage: If you think you have taken too much of this medicine contact a poison control center or emergency room at once. NOTE: This medicine is only for you. Do not share this medicine with others. What if I miss a dose? If you miss a dose, take it as soon as you can. If it is almost time for your next dose,  take only that dose. Do not take double or extra doses. What may interact with this medicine? Do not take this medicine with any of the following medications: -cisapride -dofetilide -dronedarone -live virus vaccines -penicillamine -pimozide -thioridazine -ziprasidone This medicine may also interact with the following medications: -ampicillin -antacids -cimetidine -cyclosporine -digoxin -medicines for diabetes, like insulin, glipizide, glyburide -medicines for seizures like carbamazepine, phenobarbital, phenytoin -mefloquine -methotrexate -other medicines that prolong the QT interval (cause an abnormal heart rhythm) -praziquantel This list may not describe all possible interactions. Give your health care provider a list of all the medicines, herbs, non-prescription drugs, or dietary supplements you use. Also tell them if you smoke, drink alcohol, or use illegal drugs. Some items may interact with your medicine. What should I watch for while using this medicine? Tell your doctor or healthcare professional if your symptoms do not start to get better or if they get worse. Avoid taking antacids within 4 hours of taking this medicine. It is best to separate these medicines by at least 4 hours. Tell your doctor or health care professional right away if you have any change in your eyesight. Your vision and blood may be tested before and during use of this medicine. This medicine can make you more sensitive to the sun. Keep out of the sun. If you cannot avoid being in the sun, wear protective clothing and use sunscreen. Do not use sun lamps or tanning beds/booths. What side effects may I notice from receiving this medicine? Side effects that you should report to your doctor or health care professional as soon as possible: -allergic reactions like skin rash,   itching or hives, swelling of the face, lips, or tongue -changes in vision -decreased hearing or ringing of the ears -redness,  blistering, peeling or loosening of the skin, including inside the mouth -seizures -sensitivity to light -signs and symptoms of a dangerous change in heartbeat or heart rhythm like chest pain; dizziness; fast or irregular heartbeat; palpitations; feeling faint or lightheaded, falls; breathing problems -signs and symptoms of liver injury like dark yellow or brown urine; general ill feeling or flu-like symptoms; light-colored stools; loss of appetite; nausea; right upper belly pain; unusually weak or tired; yellowing of the eyes or skin -signs and symptoms of low blood sugar such as feeling anxious; confusion; dizziness; increased hunger; unusually weak or tired; sweating; shakiness; cold; irritable; headache; blurred vision; fast heartbeat; loss of consciousness -uncontrollable head, mouth, neck, arm, or leg movements Side effects that usually do not require medical attention (report to your doctor or health care professional if they continue or are bothersome): -anxious -diarrhea -dizziness -hair loss -headache -irritable -loss of appetite -nausea, vomiting -stomach pain This list may not describe all possible side effects. Call your doctor for medical advice about side effects. You may report side effects to FDA at 1-800-FDA-1088. Where should I keep my medicine? Keep out of the reach of children. In children, this medicine can cause overdose with small doses. Store at room temperature between 15 and 30 degrees C (59 and 86 degrees F). Protect from moisture and light. Throw away any unused medicine after the expiration date. NOTE: This sheet is a summary. It may not cover all possible information. If you have questions about this medicine, talk to your doctor, pharmacist, or health care provider.  2018 Elsevier/Gold Standard (2016-05-20 14:16:15)  Standing Labs We placed an order today for your standing lab work.    Please come back and get your standing labs in 1 month and then every 3  months  We have open lab Monday through Friday from 8:30-11:30 AM and 1:30-4 PM at the office of Dr. Pollyann Savoy.   The office is located at [address], [place], [place] [address] No appointment is necessary.   Labs are drawn by [facility].  You may receive a bill from [facility] for your lab work. If you have any questions regarding directions or hours of operation,  please call [phone].    Please see your primary care physician for a pneumococcal vaccine.

## 2017-06-15 NOTE — Progress Notes (Deleted)
Patient is here for planned RFA and wants to start with right side.

## 2017-06-15 NOTE — Patient Instructions (Signed)

## 2017-06-16 NOTE — Procedures (Signed)
[name] is a 56 year old female with bilateral right more than left axial low back pain. She has facet arthritis on imaging without stenosis or radicular complaints. She's had physical therapy medication management without relief. We have completed double diagnostic medial branch blocks at L4-5 and L5-S1 facet joints with pain diary in good relief more than 50%. Proceed with right-sided radiofrequency ablation of the right L4-5 and L5-S1 facet joints.  Lumbar Facet Joint Nerve Denervation  Patient: Margaret [name]      Date of Birth: 07/30/1961 MRN: [id number] PCP: Eliott NineLeague-Sobon, Jennifer, MD      Visit Date: 06/15/2017   Universal Protocol:    Date/Time: 08/29/188:32 AM  Consent Given By: the patient  Position: PRONE  Additional Comments: Vital signs were monitored before and after the procedure. Patient was prepped and draped in the usual sterile fashion. The correct patient, procedure, and site was verified.   Injection Procedure Details:  Procedure Site One Meds Administered:  Meds ordered this encounter  Medications  . lidocaine (PF) (XYLOCAINE) 1 % injection 2 mL  . betamethasone acetate-betamethasone sodium phosphate (CELESTONE) injection 12 mg     Laterality: Right  Location/Site: Right L3 and L4 medial branches and L5 dorsal rami L4-L5 L5-S1  Needle size: 18 G  Needle type: Radiofrequency cannula  Needle Placement: Along juncture of superior articular process and transverse pocess  Findings:  -Comments:  Procedure Details: For each desired target nerve, the corresponding transverse process (sacral ala for the L5 dorsal rami) was identified and the fluoroscope was positioned to square off the endplates of the corresponding vertebral body to achieve a true AP midline view.  The beam was then obliqued 15 to 20 degrees and caudally tilted 15 to 20 degrees to line up a trajectory along the target nerves. The skin over the target of the junction of superior  articulating process and transverse process (sacral ala for the L5 dorsal rami) was infiltrated with 1ml of 1% Lidocaine without Epinephrine.  The 18 gauge 10mm active tip outer cannula was advanced in trajectory view to the target.  This procedure was repeated for each target nerve.  Then, for all levels, the outer cannula placement was fine-tuned and the position was then confirmed with bi-planar imaging.    Test stimulation was done both at sensory and motor levels to ensure there was no radicular stimulation. The target tissues were then infiltrated with 1 ml of 1% Lidocaine without Epinephrine. Subsequently, a percutaneous neurotomy was carried out for 60 seconds at 80 degrees Celsius. The procedure was repeated with the cannula rotated 90 degrees, for duration of 60 seconds, one additional time at each level for a total of two lesions per level.  After the completion of the two lesions, 1 ml of injectate was delivered. It was then repeated for each facet joint nerve mentioned above. Appropriate radiographs were obtained to verify the probe placement during the neurotomy.   Additional Comments:  The patient tolerated the procedure well Dressing: Band-Aid    Post-procedure details: Patient was observed during the procedure. Post-procedure instructions were reviewed.  Patient left the clinic in stable condition.

## 2017-06-22 ENCOUNTER — Other Ambulatory Visit (INDEPENDENT_AMBULATORY_CARE_PROVIDER_SITE_OTHER): Payer: Self-pay | Admitting: Physical Medicine and Rehabilitation

## 2017-06-22 ENCOUNTER — Encounter (INDEPENDENT_AMBULATORY_CARE_PROVIDER_SITE_OTHER): Payer: PPO | Admitting: Physical Medicine and Rehabilitation

## 2017-06-22 NOTE — Telephone Encounter (Signed)
Please advise 

## 2017-06-24 ENCOUNTER — Ambulatory Visit (INDEPENDENT_AMBULATORY_CARE_PROVIDER_SITE_OTHER): Payer: PPO | Admitting: Physical Medicine and Rehabilitation

## 2017-06-24 ENCOUNTER — Ambulatory Visit (INDEPENDENT_AMBULATORY_CARE_PROVIDER_SITE_OTHER): Payer: Self-pay

## 2017-06-24 ENCOUNTER — Encounter (INDEPENDENT_AMBULATORY_CARE_PROVIDER_SITE_OTHER): Payer: Self-pay | Admitting: Physical Medicine and Rehabilitation

## 2017-06-24 VITALS — BP 134/66 | HR 54

## 2017-06-24 DIAGNOSIS — G894 Chronic pain syndrome: Secondary | ICD-10-CM

## 2017-06-24 DIAGNOSIS — M47816 Spondylosis without myelopathy or radiculopathy, lumbar region: Secondary | ICD-10-CM

## 2017-06-24 MED ORDER — LIDOCAINE HCL (PF) 1 % IJ SOLN
2.0000 mL | Freq: Once | INTRAMUSCULAR | Status: AC
  Administered 2017-06-24: 2 mL

## 2017-06-24 MED ORDER — METHYLPREDNISOLONE ACETATE 80 MG/ML IJ SUSP
80.0000 mg | Freq: Once | INTRAMUSCULAR | Status: AC
  Administered 2017-06-24: 80 mg

## 2017-06-24 NOTE — Patient Instructions (Signed)

## 2017-06-24 NOTE — Progress Notes (Deleted)
Patient is here today for planned left side ablation. 

## 2017-06-28 NOTE — Telephone Encounter (Signed)
Duplicate request

## 2017-06-29 NOTE — Procedures (Signed)
[name] is a 56 year old female with chronic pain syndrome and chronic low back pain with facet arthropathy. She denies any radicular complaints. We have completed double diagnostic medial branch blocks of the lower spine with good relief and this is been documented with pain diary. She's had physical therapy. She's had medication management in the past and currently using tramadol. We have completed right side ablation of the L4-5 and L5-S1 facet joints with some relief. Within a complete the left side today. Please see our prior evaluation and management note for further details and justification.  Lumbar Facet Joint Nerve Denervation  Patient: Margaret [name]      Date of Birth: 09/01/1961 MRN: [id number] PCP: Eliott NineLeague-Sobon, Jennifer, MD      Visit Date: 06/24/2017   Universal Protocol:    Date/Time: 09/11/185:56 AM  Consent Given By: the patient  Position: PRONE  Additional Comments: Vital signs were monitored before and after the procedure. Patient was prepped and draped in the usual sterile fashion. The correct patient, procedure, and site was verified.   Injection Procedure Details:  Procedure Site One Meds Administered:  Meds ordered this encounter  Medications  . lidocaine (PF) (XYLOCAINE) 1 % injection 2 mL  . methylPREDNISolone acetate (DEPO-MEDROL) injection 80 mg     Laterality: Left  Location/Site: Left L3 and L4 medial branches and left L5 dorsal rami L4-L5 L5-S1  Needle size: 18 G  Needle type: Radiofrequency cannula  Needle Placement: Along juncture of superior articular process and transverse pocess  Findings:  -Comments:  Procedure Details: For each desired target nerve, the corresponding transverse process (sacral ala for the L5 dorsal rami) was identified and the fluoroscope was positioned to square off the endplates of the corresponding vertebral body to achieve a true AP midline view.  The beam was then obliqued 15 to 20 degrees and caudally  tilted 15 to 20 degrees to line up a trajectory along the target nerves. The skin over the target of the junction of superior articulating process and transverse process (sacral ala for the L5 dorsal rami) was infiltrated with 1ml of 1% Lidocaine without Epinephrine.  The 18 gauge 10mm active tip outer cannula was advanced in trajectory view to the target.  This procedure was repeated for each target nerve.  Then, for all levels, the outer cannula placement was fine-tuned and the position was then confirmed with bi-planar imaging.    Test stimulation was done both at sensory and motor levels to ensure there was no radicular stimulation. The target tissues were then infiltrated with 1 ml of 1% Lidocaine without Epinephrine. Subsequently, a percutaneous neurotomy was carried out for 60 seconds at 80 degrees Celsius. The procedure was repeated with the cannula rotated 90 degrees, for duration of 60 seconds, one additional time at each level for a total of two lesions per level.  After the completion of the two lesions, 1 ml of injectate was delivered. It was then repeated for each facet joint nerve mentioned above. Appropriate radiographs were obtained to verify the probe placement during the neurotomy.   Additional Comments:  The patient tolerated the procedure well Dressing: Band-Aid    Post-procedure details: Patient was observed during the procedure. Post-procedure instructions were reviewed.  Patient left the clinic in stable condition.

## 2017-07-05 DIAGNOSIS — J069 Acute upper respiratory infection, unspecified: Secondary | ICD-10-CM | POA: Diagnosis not present

## 2017-07-21 ENCOUNTER — Other Ambulatory Visit (INDEPENDENT_AMBULATORY_CARE_PROVIDER_SITE_OTHER): Payer: Self-pay | Admitting: Physical Medicine and Rehabilitation

## 2017-07-21 ENCOUNTER — Other Ambulatory Visit (INDEPENDENT_AMBULATORY_CARE_PROVIDER_SITE_OTHER): Payer: Self-pay | Admitting: Family

## 2017-07-21 NOTE — Telephone Encounter (Signed)
Would you be able to do a short refill of Tramadol for this patient while Dr. Alvester Morin is out of the office? She is post RFA and has a follow up with him in 2 weeks.

## 2017-07-21 NOTE — Telephone Encounter (Signed)
Dr. Alvester Morin is out of the office. Patient also sees Dr. Corliss Skains. Do you think she would be able to refill?

## 2017-07-21 NOTE — Telephone Encounter (Signed)
I like to obtain urine drug screen and do a narcotic agreement prior to giving any narcotics. I would not be able to refill her prescription of tramadol.

## 2017-07-28 ENCOUNTER — Ambulatory Visit (INDEPENDENT_AMBULATORY_CARE_PROVIDER_SITE_OTHER): Payer: PPO | Admitting: Physical Medicine and Rehabilitation

## 2017-08-02 NOTE — Telephone Encounter (Signed)
Duplicate request

## 2017-08-04 ENCOUNTER — Ambulatory Visit (INDEPENDENT_AMBULATORY_CARE_PROVIDER_SITE_OTHER): Payer: PPO | Admitting: Physical Medicine and Rehabilitation

## 2017-08-16 ENCOUNTER — Other Ambulatory Visit (INDEPENDENT_AMBULATORY_CARE_PROVIDER_SITE_OTHER): Payer: Self-pay | Admitting: Physical Medicine and Rehabilitation

## 2017-08-16 NOTE — Telephone Encounter (Signed)
Please advise 

## 2017-08-16 NOTE — Telephone Encounter (Signed)
Duplicate order.

## 2017-08-16 NOTE — Telephone Encounter (Signed)
Faxed

## 2017-08-18 ENCOUNTER — Other Ambulatory Visit: Payer: PPO | Admitting: Rheumatology

## 2017-08-25 ENCOUNTER — Encounter (INDEPENDENT_AMBULATORY_CARE_PROVIDER_SITE_OTHER): Payer: Self-pay | Admitting: Physical Medicine and Rehabilitation

## 2017-08-25 ENCOUNTER — Ambulatory Visit (INDEPENDENT_AMBULATORY_CARE_PROVIDER_SITE_OTHER): Payer: PPO | Admitting: Physical Medicine and Rehabilitation

## 2017-08-25 VITALS — BP 150/75 | HR 46

## 2017-08-25 DIAGNOSIS — G8929 Other chronic pain: Secondary | ICD-10-CM

## 2017-08-25 DIAGNOSIS — R269 Unspecified abnormalities of gait and mobility: Secondary | ICD-10-CM

## 2017-08-25 DIAGNOSIS — M79671 Pain in right foot: Secondary | ICD-10-CM | POA: Diagnosis not present

## 2017-08-25 DIAGNOSIS — M545 Low back pain: Secondary | ICD-10-CM | POA: Diagnosis not present

## 2017-08-25 DIAGNOSIS — M47816 Spondylosis without myelopathy or radiculopathy, lumbar region: Secondary | ICD-10-CM | POA: Diagnosis not present

## 2017-08-25 NOTE — Progress Notes (Deleted)
Left side is better since RFA. Right side was better for about a week, but the pain has returned. Also having trouble with gait and with right foot.

## 2017-08-30 ENCOUNTER — Encounter (INDEPENDENT_AMBULATORY_CARE_PROVIDER_SITE_OTHER): Payer: Self-pay | Admitting: Physical Medicine and Rehabilitation

## 2017-08-30 NOTE — Progress Notes (Signed)
Margaret [name] - 56 y.o. female MRN [id number]  Date of birth: 10/07/1961  Office Visit Note: Visit Date: 08/25/2017 PCP: Eliott NineLeague-Sobon, Jennifer, MD Referred by: Eliott NineLeague-Sobon, Jennifer,*  Subjective: Chief Complaint  Patient presents with  . Lower Back - Pain  . Right Foot - Pain   HPI: Margaret [name] is a 56 year old female with chronic pain syndrome and chronic musculoskeletal complaints.  She is about 4-5 weeks status post radiofrequency ablation of the L4-5 and L5-S1 facet joints.  We completed the right-sided ablation first followed by the left.  She reports the right side felt better for about a week and is really at this point still problematic.  She has no radicular pain no hip pain.  The left side has been doing quite well and she is very pleased with how the left side is done post ablation.  Again she has had no focal weakness no other changes.  No specific new injuries or falls.  She also reports today right sided foot pain.  She shows me her right foot that has no swelling or erythema but does have a callus formation on the lateral part of the foot.  She reports that her foot is turning and she has gone online and tried to make herself some shoe inserts that build up for her shoes.  He feels like it is helped to a degree.  She wonders if the foot is causing the pain on the right side of the back.  She has had no injury to her feet.  She has not noted any tingling or numbness.  She does not carry a diagnosis of diabetes.    Review of Systems  Constitutional: Negative for chills, fever, malaise/fatigue and weight loss.  HENT: Negative for hearing loss and sinus pain.   Eyes: Negative for blurred vision, double vision and photophobia.  Respiratory: Negative for cough and shortness of breath.   Cardiovascular: Negative for chest pain, palpitations and leg swelling.  Gastrointestinal: Negative for abdominal pain, nausea and vomiting.  Genitourinary: Negative for flank pain.    Musculoskeletal: Positive for back pain and joint pain. Negative for myalgias.       Right foot pain  Skin: Negative for itching and rash.  Neurological: Negative for tremors, focal weakness and weakness.  Endo/Heme/Allergies: Negative.   Psychiatric/Behavioral: Negative for depression.  All other systems reviewed and are negative.  Otherwise per HPI.  Assessment & Plan: Visit Diagnoses:  1. Pain in right foot   2. Gait abnormality   3. Chronic right-sided low back pain without sciatica   4. Spondylosis without myelopathy or radiculopathy, lumbar region     Plan: Findings:  1.  Chronic axial low back pain which we feel is facet mediated pain.  The patient has had medication managed therapy and double diagnostic medial branch blocks with good relief.  She is now had relief on the left side after ablation.  Ablation on the right seem to help for a short while but now it has returned.  We reviewed the images of both ablation procedures and everything looks pretty well placed.  She is obese.  This does affect placement to a degree.  The right facet joint at L4-5 is rather large.  Think the best approach here is to repeat just the one ablation at the L4 medial branch and just see if we can get good stimulation coagulation here before we go down and the other road of diagnostic testing degenerative problems here would be myofascial pain  obviously.  2.  Chronic worsening right foot pain.  She reports this does change her gait and she wonders if this is making her back hurt.  She is not any evaluation of her foot.  She has been using the Internet to look up ways to produce a buildup of the right shoe.  She does have a callus formation along the lateral part of the foot.  There is no swelling or redness.  There is no ulcerations.  She is not a diabetic.  I think the next step which would be more timely would be to have her see Dr. Lajoyce Corners in our office for evaluation and management.  A referral was  placed.    Meds & Orders: No orders of the defined types were placed in this encounter.   Orders Placed This Encounter  Procedures  . Ambulatory referral to Orthopedic Surgery    Follow-up: Return for Attempt at re-ablating the one L4 medial branch on the right..   Procedures: No procedures performed  No notes on file   Clinical History: MRI LUMBAR SPINE WITHOUT CONTRAST  TECHNIQUE: Multiplanar, multisequence MR imaging of the lumbar spine was performed. No intravenous contrast was administered.  COMPARISON:  01/31/2014  FINDINGS: Segmentation:  Standard.  Alignment:  Physiologic.  Vertebrae:  No fracture, evidence of discitis, or bone lesion.  Conus medullaris: Extends to the T12 level and appears normal.  Paraspinal and other soft tissues: No paraspinal abnormality.  Disc levels:  Disc spaces: Degenerative disc disease disc height loss at T11-12, L1-2, L3-4, L4-5 and L5-S1.  T11-12: Small central disc protrusion. No foraminal or central canal stenosis.  T12-L1: No significant disc bulge. No evidence of neural foraminal stenosis. No central canal stenosis.  L1-L2: Broad-based disc bulge flattening the ventral thecal sac. Mild bilateral facet arthropathy. No evidence of neural foraminal stenosis. No central canal stenosis.  L2-L3: No significant disc bulge. No evidence of neural foraminal stenosis. No central canal stenosis.  L3-L4: Broad-based disc bulge flattening the ventral thecal sac and eccentric towards the left. Mild bilateral facet arthropathy. Mild left foraminal stenosis. No right foraminal stenosis. No central canal stenosis.  L4-L5: Mild broad-based disc bulge with a small left foraminal disc protrusion. Moderate bilateral facet arthropathy. Mild right and moderate left foraminal stenosis. Bilateral lateral recess narrowing. No central canal stenosis.  L5-S1: Mild broad-based disc bulge. Moderate bilateral facet arthropathy.  Moderate bilateral foraminal narrowing. No central canal stenosis.  IMPRESSION: 1. Diffuse lumbar spine spondylosis as described above. 2. At L4-5 there is a mild broad-based disc bulge with a small left foraminal disc protrusion. Moderate bilateral facet arthropathy. Mild right and moderate left foraminal stenosis. Bilateral lateral recess narrowing.   Electronically Signed   By: Elige Ko   On: 03/17/2017 14:58  She reports that she quit smoking about 9 years ago. she has never used smokeless tobacco.  Recent Labs    05/17/17 1106  LABURIC 6.1    Objective:  VS:  HT:    WT:   BMI:     BP:(!) 150/75  HR:(!) 46bpm  TEMP: ( )  RESP:  Physical Exam  Constitutional: She is oriented to person, place, and time. She appears well-developed and well-nourished. No distress.  HENT:  Head: Normocephalic and atraumatic.  Nose: Nose normal.  Mouth/Throat: Oropharynx is clear and moist.  Eyes: Conjunctivae are normal. Pupils are equal, round, and reactive to light.  Neck: Normal range of motion. Neck supple.  Cardiovascular: Regular rhythm and intact distal pulses.  Pulmonary/Chest: Effort normal. No respiratory distress.  Abdominal: She exhibits no distension. There is no guarding.  Musculoskeletal:  Examination of the lower spine shows normal anatomic alignment.  She does have pain with extension rotation to the right.  She has paraspinal tenderness and myofascial pain.  She has no pain over the greater trochanters.  She has good hip rotation without groin pain external or internal.  She has no pain over the PSIS.  She has good distal strength.  Examination of the right foot shows callus formation on the lateral and plantar aspect.  She has intact sensation.  She has no swelling or redness.  No ulceration.  Neurological: She is alert and oriented to person, place, and time. She exhibits normal muscle tone. Coordination normal.  Skin: Skin is warm. No rash noted. No erythema.   Psychiatric: She has a normal mood and affect. Her behavior is normal.  Nursing note and vitals reviewed.   Ortho Exam Imaging: No results found.  Past Medical/Family/Surgical/Social History: Medications & Allergies reviewed per EMR Patient Active Problem List   Diagnosis Date Noted  . Primary osteoarthritis of both hands 06/14/2017  . Status post total knee replacement, bilateral 05/17/2017  . DDD (degenerative disc disease), lumbar 05/17/2017  . Failed total right knee replacement (HCC) 11/10/2012  . Obesity, Class III, BMI 40-49.9 (morbid obesity) (HCC) 11/10/2012  . Endometrial polyp 09/23/2012    Class: Present on Admission  . Hot flushes, perimenopausal 08/03/2012  . Menorrhagia 07/21/2012  . Menopause 07/13/2012  . Other and unspecified hyperlipidemia 07/13/2012  . Bereavement 07/13/2012  . Essential hypertension, benign 07/13/2012  . History of migraine 07/13/2012  . Hypothyroidism 07/13/2012   Past Medical History:  Diagnosis Date  . Arthritis    knees   . Hypertension    on meds, reviewed by Dr. Jacinto HalimGanji for cardiac clearance - 09/2013, told that she was cleared & no need to follow up with him   . Hypothyroidism   . Mental disorder    perimenopause use of Paxil  . Thyroid disease   . Total knee replacement status    Family History  Problem Relation Age of Onset  . Hyperlipidemia Mother   . Hypertension Mother   . Heart disease Mother   . Kidney disease Mother   . Rheumatologic disease Mother   . Heart disease Father   . Cancer Father   . Heart disease Sister   . Hypertension Sister    Past Surgical History:  Procedure Laterality Date  . CARPAL TUNNEL RELEASE     2000, 2001, both hands  . HAND SURGERY    . JOINT REPLACEMENT  2000, 2012,   knee replacement bilateral left-2000, 2012 right-2002  . TONSILLECTOMY    . TUBAL LIGATION    . VAGINAL BIRTH AFTER CESAREAN SECTION     83, 85,87  . VAGINAL DELIVERY     [phone],85,87   Social History    Occupational History  . Not on file  Tobacco Use  . Smoking status: Former Smoker    Last attempt to quit: 07/13/2008    Years since quitting: 9.1  . Smokeless tobacco: Never Used  Substance and Sexual Activity  . Alcohol use: No  . Drug use: No  . Sexual activity: Yes

## 2017-08-31 ENCOUNTER — Ambulatory Visit (INDEPENDENT_AMBULATORY_CARE_PROVIDER_SITE_OTHER): Payer: PPO | Admitting: Orthopedic Surgery

## 2017-08-31 ENCOUNTER — Ambulatory Visit (INDEPENDENT_AMBULATORY_CARE_PROVIDER_SITE_OTHER): Payer: PPO

## 2017-08-31 ENCOUNTER — Encounter (INDEPENDENT_AMBULATORY_CARE_PROVIDER_SITE_OTHER): Payer: Self-pay | Admitting: Orthopedic Surgery

## 2017-08-31 VITALS — Ht 66.0 in | Wt 304.0 lb

## 2017-08-31 DIAGNOSIS — M79671 Pain in right foot: Secondary | ICD-10-CM

## 2017-08-31 DIAGNOSIS — M84374S Stress fracture, right foot, sequela: Secondary | ICD-10-CM

## 2017-08-31 NOTE — Progress Notes (Signed)
Office Visit Note   Patient: Margaret [name]           Date of Birth: 12/07/1960           MRN: [id number] Visit Date: 08/31/2017              Requested by: Eliott NineLeague-Sobon, Jennifer, MD [address] [place], [place] [id number] PCP: Eliott NineLeague-Sobon, Jennifer, MD  Chief Complaint  Patient presents with  . Right Foot - Pain      HPI: Patient is a 56 year old woman who was seen in referral from Dr. Alvester MorinNewton.  Patient states that she has been having chronic pain over the lateral aspect of the right foot she states that this has altered her gait that she has to roll her ankle to get off the outside of her foot she states this is causing knee pain and pain radiating up her leg.  Patient denies any specific trauma.  Assessment & Plan: Visit Diagnoses:  1. Pain in right foot   2. Fracture, stress, metatarsal, right, sequela     Plan: Recommended a stiff soled walking shoe or a Trail running shoe this was demonstrated to her.  We will have her wear the stiff soled shoe for a month repeat radiographs.  Patient may be developing a fibrous union and may require open reduction internal fixation.  Will reevaluate at follow-up.  Repeat three-view radiographs of the right foot at follow-up.  Knee-high 15-20 mm compression stockings to be worn daily.  Follow-Up Instructions: Return in about 4 weeks (around 09/28/2017).   Ortho Exam  Patient is alert, oriented, no adenopathy, well-dressed, normal affect, normal respiratory effort. Examination patient has a good dorsalis pedis and posterior tibial pulse she has good ankle good subtalar motion.  She has venous stasis swelling with venous insufficiency but no open ulcers there is pitting edema.  She has swelling over the base of the fifth metatarsal and this is the area where she is point tender for the entire foot.  She is asymptomatic and the remainder of her foot.  Imaging: Xr Foot 2 Views Right  Result Date: 08/31/2017 Three-view radiographs of the right  foot shows a healing fracture of the base of the fifth metatarsal right foot at the metaphyseal diaphyseal junction consistent with a Jones fracture.  No images are attached to the encounter.  Labs: Lab Results  Component Value Date   ESRSEDRATE 4 05/17/2017   LABURIC 6.1 05/17/2017   REPTSTATUS 11/11/2012 FINAL 11/08/2012   REPTSTATUS 11/13/2012 FINAL 11/08/2012   REPTSTATUS 11/08/2012 FINAL 11/08/2012   GRAMSTAIN  11/08/2012    RARE WBC PRESENT, PREDOMINANTLY MONONUCLEAR NO ORGANISMS SEEN [facility] Gram Stain Report Called to,Read Back By and Verified With: Gram Stain Report Called to,Read Back By and Verified With: OR @11 :50 ON 11/08/12 BY K SCHULTZ   GRAMSTAIN  11/08/2012    RARE WBC PRESENT, PREDOMINANTLY MONONUCLEAR NO ORGANISMS SEEN   GRAMSTAIN  11/08/2012    RARE WBC PRESENT, PREDOMINANTLY MONONUCLEAR NO ORGANISMS SEEN CALLED TO OR 11/08/12 1150 BY K SCHULTZ   CULT NO GROWTH 3 DAYS 11/08/2012   CULT NO ANAEROBES ISOLATED 11/08/2012    Orders:  Orders Placed This Encounter  Procedures  . XR Foot 2 Views Right   No orders of the defined types were placed in this encounter.    Procedures: No procedures performed  Clinical Data: No additional findings.  ROS:  All other systems negative, except as noted in the HPI. Review of  Systems  Objective: Vital Signs: Ht 5\' 6"  (1.676 m)   Wt (!) 304 lb (137.9 kg)   BMI 49.07 kg/m   Specialty Comments:  No specialty comments available.  PMFS History: Patient Active Problem List   Diagnosis Date Noted  . Primary osteoarthritis of both hands 06/14/2017  . Status post total knee replacement, bilateral 05/17/2017  . DDD (degenerative disc disease), lumbar 05/17/2017  . Failed total right knee replacement (HCC) 11/10/2012  . Obesity, Class III, BMI 40-49.9 (morbid obesity) (HCC) 11/10/2012  . Endometrial polyp 09/23/2012    Class: Present on Admission  . Hot flushes, perimenopausal 08/03/2012    . Menorrhagia 07/21/2012  . Menopause 07/13/2012  . Other and unspecified hyperlipidemia 07/13/2012  . Bereavement 07/13/2012  . Essential hypertension, benign 07/13/2012  . History of migraine 07/13/2012  . Hypothyroidism 07/13/2012   Past Medical History:  Diagnosis Date  . Arthritis    knees   . Hypertension    on meds, reviewed by Dr. Jacinto HalimGanji for cardiac clearance - 09/2013, told that she was cleared & no need to follow up with him   . Hypothyroidism   . Mental disorder    perimenopause use of Paxil  . Thyroid disease   . Total knee replacement status     Family History  Problem Relation Age of Onset  . Hyperlipidemia Mother   . Hypertension Mother   . Heart disease Mother   . Kidney disease Mother   . Rheumatologic disease Mother   . Heart disease Father   . Cancer Father   . Heart disease Sister   . Hypertension Sister     Past Surgical History:  Procedure Laterality Date  . CARPAL TUNNEL RELEASE     2000, 2001, both hands  . HAND SURGERY    . JOINT REPLACEMENT  2000, 2012,   knee replacement bilateral left-2000, 2012 right-2002  . TONSILLECTOMY    . TUBAL LIGATION    . VAGINAL BIRTH AFTER CESAREAN SECTION     83, 85,87  . VAGINAL DELIVERY     [phone],85,87   Social History   Occupational History  . Not on file  Tobacco Use  . Smoking status: Former Smoker    Last attempt to quit: 07/13/2008    Years since quitting: 9.1  . Smokeless tobacco: Never Used  Substance and Sexual Activity  . Alcohol use: No  . Drug use: No  . Sexual activity: Yes

## 2017-09-02 ENCOUNTER — Ambulatory Visit (INDEPENDENT_AMBULATORY_CARE_PROVIDER_SITE_OTHER): Payer: PPO | Admitting: Physical Medicine and Rehabilitation

## 2017-09-02 ENCOUNTER — Ambulatory Visit (INDEPENDENT_AMBULATORY_CARE_PROVIDER_SITE_OTHER): Payer: Self-pay

## 2017-09-02 DIAGNOSIS — G8929 Other chronic pain: Secondary | ICD-10-CM

## 2017-09-02 DIAGNOSIS — M545 Low back pain: Secondary | ICD-10-CM | POA: Diagnosis not present

## 2017-09-02 DIAGNOSIS — M47816 Spondylosis without myelopathy or radiculopathy, lumbar region: Secondary | ICD-10-CM | POA: Diagnosis not present

## 2017-09-02 NOTE — Progress Notes (Signed)
Margaret DienerDeborah H [name] - 56 y.o. female MRN [id number]  Date of birth: 03/25/1961  Office Visit Note: Visit Date: 09/02/2017 PCP: Eliott NineLeague-Sobon, Jennifer, MD Referred by: Eliott NineLeague-Sobon, Jennifer,*  Subjective: Chief Complaint  Patient presents with  . Lower Back - Pain  . Right Foot - Pain   HPI: Margaret [name] is a 56 year old female that comes in today with 2 distinct problems.  She really has chronic low back pain and chronic pain syndrome.  She has been through multiple bouts of physical therapy and pain management as well as orthopedic treatment.  We recently completed radiofrequency of the L4-5 facet joints and L5-S1.  She got good relief on the left side but not really on the right side.  The last time we saw her we decided we would give it a little bit more time on the right to see how she does and she really just has not gotten as much relief although overall she does feel better.  To have pain with standing and rotation.  Again left-sided symptoms have really been abated at this point with the ablation.  We reviewed the imaging on the right side did show that she has significant arthropathy at L4-5 and looking at the cannula placement it looks fairly good but sometimes the medial branches just hard to denervate when the joint is that big.  Today we are going to quickly perform a repeat ablation of just the L4 medial branch.  Otherwise she is continuing with her tramadol.  She continues with back exercises.  She uses a heating pad significantly on the right side.  She has no paresthesias or numbness or tingling down the right leg.  Her second issue is with her right foot.  We did have her see Dr. Lajoyce Cornersuda for evaluation and it turns out she does have a Jones fracture of the fifth metatarsal on the right.  I did show her the x-rays of this today.  Dr. Lajoyce Cornersuda is following her for this.  To have right foot pain she is wearing sturdier shoes.  She did ask about having to wear the shoes at home which I did tell  her she probably should do.  She has no numbness tingling in the feet.    Review of Systems  Constitutional: Negative for chills, fever, malaise/fatigue and weight loss.  HENT: Negative for hearing loss and sinus pain.   Eyes: Negative for blurred vision, double vision and photophobia.  Respiratory: Negative for cough and shortness of breath.   Cardiovascular: Negative for chest pain, palpitations and leg swelling.  Gastrointestinal: Negative for abdominal pain, nausea and vomiting.  Genitourinary: Negative for flank pain.  Musculoskeletal: Positive for back pain and joint pain. Negative for myalgias.  Skin: Negative for itching and rash.  Neurological: Negative for tremors, focal weakness and weakness.  Endo/Heme/Allergies: Negative.   Psychiatric/Behavioral: Negative for depression.  All other systems reviewed and are negative.  Otherwise per HPI.  Assessment & Plan: Visit Diagnoses:  1. Spondylosis without myelopathy or radiculopathy, lumbar region   2. Chronic right-sided low back pain without sciatica     Plan: Findings:  Chronic worsening axial low back pain felt to be mostly facet mediated pain but could be somewhat discogenic on the right or myofascial.  To have right-sided low back pain despite radiofrequency ablation.  Ablation seems to have really improve the left side significantly.  I think because of that and because of the placement with the cannula and the larger facet joint we did  take her to the procedure room today quickly and performed a repeat ablation of just the L4 medial branch.  This procedure did go well and looked fine.  She with tramadol.  She has had a history of some opioid use and I will think this is really a situation where she should get into chronic opioids.  We have talked about this at length.  We may look at much of therapy or additional medication depending on the relief that she gets.  She is obese talk about activity modification and back exercises.   She is having a little bit of a hard time now with a right foot fracture.  She follows with Dr. due to for this.    Meds & Orders: No orders of the defined types were placed in this encounter.   Orders Placed This Encounter  Procedures  . Radiofrequency,Lumbar  . XR C-ARM NO REPORT    Follow-up: Return if symptoms worsen or fail to improve.   Procedures: No procedures performed  Lumbar Facet Joint Nerve Denervation  Patient: Margaret [name]      Date of Birth: 30-Aug-1961 MRN: [id number] PCP: Eliott Nine, MD      Visit Date: 09/02/2017   Universal Protocol:    Date/Time: 11/26/185:34 AM  Consent Given By: the patient  Position: PRONE  Additional Comments: Vital signs were monitored before and after the procedure. Patient was prepped and draped in the usual sterile fashion. The correct patient, procedure, and site was verified.   Injection Procedure Details:  Procedure Site One Meds Administered: No orders of the defined types were placed in this encounter.    Laterality: Right  Location/Site:  Right L4 Medial branch L4-L5  Needle size: 18 G  Needle type: Radiofrequency cannula  Needle Placement: Along juncture of superior articular process and transverse pocess  Findings:  -Comments:  Procedure Details: For each desired target nerve, the corresponding transverse process (sacral ala for the L5 dorsal rami) was identified and the fluoroscope was positioned to square off the endplates of the corresponding vertebral body to achieve a true AP midline view.  The beam was then obliqued 15 to 20 degrees and caudally tilted 15 to 20 degrees to line up a trajectory along the target nerves. The skin over the target of the junction of superior articulating process and transverse process (sacral ala for the L5 dorsal rami) was infiltrated with 1ml of 1% Lidocaine without Epinephrine.  The 18 gauge 10mm active tip outer cannula was advanced in trajectory view to the  target.  This procedure was repeated for each target nerve.  Then, for all levels, the outer cannula placement was fine-tuned and the position was then confirmed with bi-planar imaging.    Test stimulation was done both at sensory and motor levels to ensure there was no radicular stimulation. The target tissues were then infiltrated with 1 ml of 1% Lidocaine without Epinephrine. Subsequently, a percutaneous neurotomy was carried out for 60 seconds at 80 degrees Celsius. The procedure was repeated with the cannula rotated 90 degrees, for duration of 60 seconds, one additional time at each level for a total of two lesions per level.  After the completion of the two lesions, 1 ml of injectate was delivered. It was then repeated for each facet joint nerve mentioned above. Appropriate radiographs were obtained to verify the probe placement during the neurotomy.   Additional Comments:  The patient tolerated the procedure well No complications occurred Dressing: Band-Aid    Post-procedure details:  Patient was observed during the procedure. Post-procedure instructions were reviewed.  Patient left the clinic in stable condition.       Clinical History: MRI LUMBAR SPINE WITHOUT CONTRAST  TECHNIQUE: Multiplanar, multisequence MR imaging of the lumbar spine was performed. No intravenous contrast was administered.  COMPARISON:  01/31/2014  FINDINGS: Segmentation:  Standard.  Alignment:  Physiologic.  Vertebrae:  No fracture, evidence of discitis, or bone lesion.  Conus medullaris: Extends to the T12 level and appears normal.  Paraspinal and other soft tissues: No paraspinal abnormality.  Disc levels:  Disc spaces: Degenerative disc disease disc height loss at T11-12, L1-2, L3-4, L4-5 and L5-S1.  T11-12: Small central disc protrusion. No foraminal or central canal stenosis.  T12-L1: No significant disc bulge. No evidence of neural foraminal stenosis. No central canal  stenosis.  L1-L2: Broad-based disc bulge flattening the ventral thecal sac. Mild bilateral facet arthropathy. No evidence of neural foraminal stenosis. No central canal stenosis.  L2-L3: No significant disc bulge. No evidence of neural foraminal stenosis. No central canal stenosis.  L3-L4: Broad-based disc bulge flattening the ventral thecal sac and eccentric towards the left. Mild bilateral facet arthropathy. Mild left foraminal stenosis. No right foraminal stenosis. No central canal stenosis.  L4-L5: Mild broad-based disc bulge with a small left foraminal disc protrusion. Moderate bilateral facet arthropathy. Mild right and moderate left foraminal stenosis. Bilateral lateral recess narrowing. No central canal stenosis.  L5-S1: Mild broad-based disc bulge. Moderate bilateral facet arthropathy. Moderate bilateral foraminal narrowing. No central canal stenosis.  IMPRESSION: 1. Diffuse lumbar spine spondylosis as described above. 2. At L4-5 there is a mild broad-based disc bulge with a small left foraminal disc protrusion. Moderate bilateral facet arthropathy. Mild right and moderate left foraminal stenosis. Bilateral lateral recess narrowing.   Electronically Signed   By: Elige Ko   On: 03/17/2017 14:58  She reports that she quit smoking about 9 years ago. she has never used smokeless tobacco.  Recent Labs    05/17/17 1106  LABURIC 6.1    Objective:  VS:  HT:    WT:   BMI:     BP:   HR: bpm  TEMP: ( )  RESP:  Physical Exam  Constitutional: She is oriented to person, place, and time. She appears well-developed and well-nourished. No distress.  Obese  HENT:  Head: Normocephalic and atraumatic.  Nose: Nose normal.  Mouth/Throat: Oropharynx is clear and moist.  Eyes: Conjunctivae are normal. Pupils are equal, round, and reactive to light.  Neck: Normal range of motion. Neck supple.  Cardiovascular: Regular rhythm and intact distal pulses.    Pulmonary/Chest: Effort normal. No respiratory distress.  Abdominal: She exhibits no distension. There is no guarding.  Musculoskeletal:  Patient ambulates without aid.  She does have an antalgic gait to the right.  She is wearing firm shoes today brief evaluation of the right foot shows no increased swelling she is still tender over the fifth metatarsal.  She has good distal strength.  She has no pain with hip rotation.  Examination of the lumbar spine shows significant changes in the skin due to increased heating pad use.  She does have pain with extension of the lumbar spine.  Neurological: She is alert and oriented to person, place, and time. She exhibits normal muscle tone. Coordination normal.  Skin: Skin is warm. No rash noted. No erythema.  Psychiatric: She has a normal mood and affect. Her behavior is normal.  Nursing note and vitals reviewed.   Ortho  Exam Imaging: No results found.  Past Medical/Family/Surgical/Social History: Medications & Allergies reviewed per EMR Patient Active Problem List   Diagnosis Date Noted  . Primary osteoarthritis of both hands 06/14/2017  . Status post total knee replacement, bilateral 05/17/2017  . DDD (degenerative disc disease), lumbar 05/17/2017  . Failed total right knee replacement (HCC) 11/10/2012  . Obesity, Class III, BMI 40-49.9 (morbid obesity) (HCC) 11/10/2012  . Endometrial polyp 09/23/2012    Class: Present on Admission  . Hot flushes, perimenopausal 08/03/2012  . Menorrhagia 07/21/2012  . Menopause 07/13/2012  . Other and unspecified hyperlipidemia 07/13/2012  . Bereavement 07/13/2012  . Essential hypertension, benign 07/13/2012  . History of migraine 07/13/2012  . Hypothyroidism 07/13/2012   Past Medical History:  Diagnosis Date  . Arthritis    knees   . Hypertension    on meds, reviewed by Dr. Jacinto HalimGanji for cardiac clearance - 09/2013, told that she was cleared & no need to follow up with him   . Hypothyroidism   . Mental  disorder    perimenopause use of Paxil  . Thyroid disease   . Total knee replacement status    Family History  Problem Relation Age of Onset  . Hyperlipidemia Mother   . Hypertension Mother   . Heart disease Mother   . Kidney disease Mother   . Rheumatologic disease Mother   . Heart disease Father   . Cancer Father   . Heart disease Sister   . Hypertension Sister    Past Surgical History:  Procedure Laterality Date  . CARPAL TUNNEL RELEASE     2000, 2001, both hands  . DILATATION & CURRETTAGE/HYSTEROSCOPY WITH RESECTOCOPE  09/23/2012   Procedure: DILATATION & CURETTAGE/HYSTEROSCOPY WITH RESECTOCOPE;  Surgeon: Juluis MireJohn S McComb, MD;  Location: [facility];  Service: Gynecology;  Laterality: N/A;  . HAND SURGERY    . JOINT REPLACEMENT  2000, 2012,   knee replacement bilateral left-2000, 2012 right-2002  . TONSILLECTOMY    . TOTAL KNEE REVISION  11/08/2012   Procedure: TOTAL KNEE REVISION;  Surgeon: Valeria BatmanPeter W [name], MD;  [facility];  Service: Orthopedics;  Laterality: Right;  Revision Right Total Knee Replacement   . TUBAL LIGATION    . VAGINAL BIRTH AFTER CESAREAN SECTION     83, 85,87  . VAGINAL DELIVERY     [phone],85,87   Social History   Occupational History  . Not on file  Tobacco Use  . Smoking status: Former Smoker    Last attempt to quit: 07/13/2008    Years since quitting: 9.1  . Smokeless tobacco: Never Used  Substance and Sexual Activity  . Alcohol use: No  . Drug use: No  . Sexual activity: Yes

## 2017-09-08 ENCOUNTER — Other Ambulatory Visit: Payer: Self-pay | Admitting: Rheumatology

## 2017-09-08 NOTE — Telephone Encounter (Signed)
Last Visit: 06/15/17 Next Visit: 09/24/17 Labs: 05/17/17 cbc/cmp wnl PLQ eye exam: Patient states she has not had her PLQ Eye Exam. Patient is scheduling for the first of December   Okay to refill 30 day supply per Dr. Corliss Skainseveshwar

## 2017-09-10 NOTE — Progress Notes (Deleted)
Office Visit Note  Patient: Margaret [name]             Date of Birth: 07/27/1961           MRN: [id number]             PCP: Eliott NineLeague-Sobon, Jennifer, MD Referring: Eliott NineLeague-Sobon, Jennifer,* Visit Date: 09/24/2017 Occupation: @GUAROCC @    Subjective:  No chief complaint on file.   History of Present Illness: Margaret [name] is a 56 y.o. female ***   Activities of Daily Living:  Patient reports morning stiffness for *** {minute/hour:19697}.   Patient {ACTIONS;DENIES/REPORTS:21021675::"Denies"} nocturnal pain.  Difficulty dressing/grooming: {ACTIONS;DENIES/REPORTS:21021675::"Denies"} Difficulty climbing stairs: {ACTIONS;DENIES/REPORTS:21021675::"Denies"} Difficulty getting out of chair: {ACTIONS;DENIES/REPORTS:21021675::"Denies"} Difficulty using hands for taps, buttons, cutlery, and/or writing: {ACTIONS;DENIES/REPORTS:21021675::"Denies"}   No Rheumatology ROS completed.   PMFS History:  Patient Active Problem List   Diagnosis Date Noted  . Primary osteoarthritis of both hands 06/14/2017  . Status post total knee replacement, bilateral 05/17/2017  . DDD (degenerative disc disease), lumbar 05/17/2017  . Failed total right knee replacement (HCC) 11/10/2012  . Obesity, Class III, BMI 40-49.9 (morbid obesity) (HCC) 11/10/2012  . Endometrial polyp 09/23/2012    Class: Present on Admission  . Hot flushes, perimenopausal 08/03/2012  . Menorrhagia 07/21/2012  . Menopause 07/13/2012  . Other and unspecified hyperlipidemia 07/13/2012  . Bereavement 07/13/2012  . Essential hypertension, benign 07/13/2012  . History of migraine 07/13/2012  . Hypothyroidism 07/13/2012    Past Medical History:  Diagnosis Date  . Arthritis    knees   . Hypertension    on meds, reviewed by Dr. Jacinto HalimGanji for cardiac clearance - 09/2013, told that she was cleared & no need to follow up with him   . Hypothyroidism   . Mental disorder    perimenopause use of Paxil  . Thyroid disease   . Total knee  replacement status     Family History  Problem Relation Age of Onset  . Hyperlipidemia Mother   . Hypertension Mother   . Heart disease Mother   . Kidney disease Mother   . Rheumatologic disease Mother   . Heart disease Father   . Cancer Father   . Heart disease Sister   . Hypertension Sister    Past Surgical History:  Procedure Laterality Date  . CARPAL TUNNEL RELEASE     2000, 2001, both hands  . DILATATION & CURRETTAGE/HYSTEROSCOPY WITH RESECTOCOPE  09/23/2012   Procedure: DILATATION & CURETTAGE/HYSTEROSCOPY WITH RESECTOCOPE;  Surgeon: Juluis MireJohn S McComb, MD;  Location: [facility];  Service: Gynecology;  Laterality: N/A;  . HAND SURGERY    . JOINT REPLACEMENT  2000, 2012,   knee replacement bilateral left-2000, 2012 right-2002  . TONSILLECTOMY    . TOTAL KNEE REVISION  11/08/2012   Procedure: TOTAL KNEE REVISION;  Surgeon: Valeria BatmanPeter W [name], MD;  [facility];  Service: Orthopedics;  Laterality: Right;  Revision Right Total Knee Replacement   . TUBAL LIGATION    . VAGINAL BIRTH AFTER CESAREAN SECTION     83, 85,87  . VAGINAL DELIVERY     [phone],85,87   Social History   Social History Narrative  . Not on file     Objective: Vital Signs: There were no vitals taken for this visit.   Physical Exam   Musculoskeletal Exam: ***  CDAI Exam: No CDAI exam completed.    Investigation: No additional findings. CBC Latest Ref Rng & Units 05/17/2017 11/10/2012 11/09/2012  WBC 3.8 - 10.8 K/uL 7.7 9.9 7.9  Hemoglobin 11.7 - 15.5 g/dL 16.112.7 11.0(L) 10.8(L)  Hematocrit 35.0 - 45.0 % 38.2 32.7(L) 31.5(L)  Platelets 140 - 400 K/uL 304 202 186   CMP Latest Ref Rng & Units 05/17/2017 11/10/2012 11/09/2012  Glucose 65 - 99 mg/dL 89 096(E144(H) 454(U127(H)  BUN 7 - 25 mg/dL 16 5(L) 11  Creatinine 0.50 - 1.05 mg/dL 9.810.62 1.91(Y0.36(L) 7.82(N0.47(L)  Sodium 135 - 146 mmol/L 139 136 137  Potassium 3.5 - 5.3 mmol/L 4.2 3.2(L) 3.5  Chloride 98 - 110 mmol/L 99 99 103  CO2 20 - 31 mmol/L 25 26 28   Calcium 8.6 - 10.4  mg/dL 9.3 8.7 5.6(O8.3(L)  Total Protein 6.1 - 8.1 g/dL 6.4 - -  Total Bilirubin 0.2 - 1.2 mg/dL 0.3 - -  Alkaline Phos 33 - 130 U/L 45 - -  AST 10 - 35 U/L 11 - -  ALT 6 - 29 U/L 9 - -    Imaging: Xr C-arm No Report  Result Date: 09/02/2017 Please see Notes or Procedures tab for imaging impression.  Xr Foot 2 Views Right  Result Date: 08/31/2017 Three-view radiographs of the right foot shows a healing fracture of the base of the fifth metatarsal right foot at the metaphyseal diaphyseal junction consistent with a Jones fracture.   Speciality Comments: No specialty comments available.    Procedures:  No procedures performed Allergies: Codeine   Assessment / Plan:     Visit Diagnoses: No diagnosis found.    Orders: No orders of the defined types were placed in this encounter.  No orders of the defined types were placed in this encounter.   Face-to-face time spent with patient was *** minutes. 50% of time was spent in counseling and coordination of care.  Follow-Up Instructions: No Follow-up on file.   Ellen HenriMarissa C Revan Gendron, CMA  Note - This record has been created using Animal nutritionistDragon software.  Chart creation errors have been sought, but may not always  have been located. Such creation errors do not reflect on  the standard of medical care.

## 2017-09-13 ENCOUNTER — Encounter (INDEPENDENT_AMBULATORY_CARE_PROVIDER_SITE_OTHER): Payer: Self-pay | Admitting: Physical Medicine and Rehabilitation

## 2017-09-13 NOTE — Procedures (Signed)
Lumbar Facet Joint Nerve Denervation  Patient: Margaret DienerDeborah H [name]      Date of Birth: 03/24/1961 MRN: [id number] PCP: Eliott NineLeague-Sobon, Jennifer, MD      Visit Date: 09/02/2017   Universal Protocol:    Date/Time: 11/26/185:34 AM  Consent Given By: the patient  Position: PRONE  Additional Comments: Vital signs were monitored before and after the procedure. Patient was prepped and draped in the usual sterile fashion. The correct patient, procedure, and site was verified.   Injection Procedure Details:  Procedure Site One Meds Administered: No orders of the defined types were placed in this encounter.    Laterality: Right  Location/Site:  Right L4 Medial branch L4-L5  Needle size: 18 G  Needle type: Radiofrequency cannula  Needle Placement: Along juncture of superior articular process and transverse pocess  Findings:  -Comments:  Procedure Details: For each desired target nerve, the corresponding transverse process (sacral ala for the L5 dorsal rami) was identified and the fluoroscope was positioned to square off the endplates of the corresponding vertebral body to achieve a true AP midline view.  The beam was then obliqued 15 to 20 degrees and caudally tilted 15 to 20 degrees to line up a trajectory along the target nerves. The skin over the target of the junction of superior articulating process and transverse process (sacral ala for the L5 dorsal rami) was infiltrated with 1ml of 1% Lidocaine without Epinephrine.  The 18 gauge 10mm active tip outer cannula was advanced in trajectory view to the target.  This procedure was repeated for each target nerve.  Then, for all levels, the outer cannula placement was fine-tuned and the position was then confirmed with bi-planar imaging.    Test stimulation was done both at sensory and motor levels to ensure there was no radicular stimulation. The target tissues were then infiltrated with 1 ml of 1% Lidocaine without Epinephrine.  Subsequently, a percutaneous neurotomy was carried out for 60 seconds at 80 degrees Celsius. The procedure was repeated with the cannula rotated 90 degrees, for duration of 60 seconds, one additional time at each level for a total of two lesions per level.  After the completion of the two lesions, 1 ml of injectate was delivered. It was then repeated for each facet joint nerve mentioned above. Appropriate radiographs were obtained to verify the probe placement during the neurotomy.   Additional Comments:  The patient tolerated the procedure well No complications occurred Dressing: Band-Aid    Post-procedure details: Patient was observed during the procedure. Post-procedure instructions were reviewed.  Patient left the clinic in stable condition.

## 2017-09-14 ENCOUNTER — Other Ambulatory Visit (INDEPENDENT_AMBULATORY_CARE_PROVIDER_SITE_OTHER): Payer: Self-pay | Admitting: Physical Medicine and Rehabilitation

## 2017-09-14 NOTE — Telephone Encounter (Signed)
Please advise 

## 2017-09-14 NOTE — Telephone Encounter (Signed)
Called in to patient's pharmacy. 

## 2017-09-14 NOTE — Telephone Encounter (Signed)
Duplicate request. Original called in to patient's pharmacy.

## 2017-09-24 ENCOUNTER — Telehealth (INDEPENDENT_AMBULATORY_CARE_PROVIDER_SITE_OTHER): Payer: Self-pay | Admitting: Orthopedic Surgery

## 2017-09-24 ENCOUNTER — Ambulatory Visit: Payer: PPO | Admitting: Rheumatology

## 2017-09-24 NOTE — Telephone Encounter (Signed)
Patient called advised she has not found a hard sole shoe that will not bend. Patient asked if she can get a boot a hard bottom shoe that Velcro across the top of the foot. The number to contact patient is [phone]

## 2017-09-25 ENCOUNTER — Encounter (INDEPENDENT_AMBULATORY_CARE_PROVIDER_SITE_OTHER): Payer: Self-pay | Admitting: Orthopedic Surgery

## 2017-09-28 NOTE — Telephone Encounter (Signed)
Dr. Lajoyce Cornersuda told patient to come by some time for fracture boot.

## 2017-10-04 ENCOUNTER — Ambulatory Visit (INDEPENDENT_AMBULATORY_CARE_PROVIDER_SITE_OTHER): Payer: PPO | Admitting: Radiology

## 2017-10-04 DIAGNOSIS — M84374S Stress fracture, right foot, sequela: Secondary | ICD-10-CM

## 2017-10-04 DIAGNOSIS — M79671 Pain in right foot: Secondary | ICD-10-CM

## 2017-10-04 NOTE — Progress Notes (Signed)
Per note from Dr Lajoyce Cornersuda she needs a fracture boot.

## 2017-10-05 ENCOUNTER — Ambulatory Visit (INDEPENDENT_AMBULATORY_CARE_PROVIDER_SITE_OTHER): Payer: PPO | Admitting: Orthopedic Surgery

## 2017-10-19 ENCOUNTER — Other Ambulatory Visit (INDEPENDENT_AMBULATORY_CARE_PROVIDER_SITE_OTHER): Payer: Self-pay | Admitting: Physical Medicine and Rehabilitation

## 2017-10-20 NOTE — Telephone Encounter (Signed)
Faxed to patient's pharmacy.  

## 2017-10-20 NOTE — Telephone Encounter (Signed)
Please advise 

## 2017-10-22 ENCOUNTER — Ambulatory Visit (INDEPENDENT_AMBULATORY_CARE_PROVIDER_SITE_OTHER): Payer: PPO | Admitting: Orthopedic Surgery

## 2017-10-28 ENCOUNTER — Ambulatory Visit (INDEPENDENT_AMBULATORY_CARE_PROVIDER_SITE_OTHER): Payer: PPO | Admitting: Orthopedic Surgery

## 2017-10-29 DIAGNOSIS — J069 Acute upper respiratory infection, unspecified: Secondary | ICD-10-CM | POA: Diagnosis not present

## 2017-11-03 ENCOUNTER — Ambulatory Visit (INDEPENDENT_AMBULATORY_CARE_PROVIDER_SITE_OTHER): Payer: PPO | Admitting: Orthopedic Surgery

## 2017-11-16 ENCOUNTER — Encounter (INDEPENDENT_AMBULATORY_CARE_PROVIDER_SITE_OTHER): Payer: Self-pay | Admitting: Orthopedic Surgery

## 2017-11-16 ENCOUNTER — Ambulatory Visit (INDEPENDENT_AMBULATORY_CARE_PROVIDER_SITE_OTHER): Payer: PPO | Admitting: Orthopedic Surgery

## 2017-11-16 ENCOUNTER — Ambulatory Visit (INDEPENDENT_AMBULATORY_CARE_PROVIDER_SITE_OTHER): Payer: PPO

## 2017-11-16 VITALS — Ht 66.0 in | Wt 304.0 lb

## 2017-11-16 DIAGNOSIS — M79671 Pain in right foot: Secondary | ICD-10-CM | POA: Diagnosis not present

## 2017-11-16 DIAGNOSIS — M84374S Stress fracture, right foot, sequela: Secondary | ICD-10-CM

## 2017-11-16 NOTE — Progress Notes (Signed)
Office Visit Note   Patient: Margaret [name]           Date of Birth: 07/11/1961           MRN: [id number] Visit Date: 11/16/2017              Requested by: Eliott NineLeague-Sobon, Jennifer, MD [address] [place], [place] [id number] PCP: Eliott NineLeague-Sobon, Jennifer, MD  Chief Complaint  Patient presents with  . Right Foot - Follow-up    Stress fracture       HPI: Patient presents follow-up status post a stress fracture base of the fifth metatarsal right foot.  Patient is currently in regular shoewear she states she is feeling better with regular ambulation but feels like she tends to roll her ankle over in a varus alignment.  Assessment & Plan: Visit Diagnoses:  1. Pain in right foot   2. Fracture, stress, metatarsal, right, sequela     Plan: A felt pad was placed laterally in her right shoe to posterior foot laterally she states that this did feel better with ambulation this unloaded the fifth metatarsal base as well.  Follow-up in 4 weeks with repeat three-view radiographs of the right foot.  Follow-Up Instructions: Return in about 4 weeks (around 12/14/2017).   Ortho Exam  Patient is alert, oriented, no adenopathy, well-dressed, normal affect, normal respiratory effort. Examination patient has a good pulse she has varus alignment of the calcaneus on the right.  This does tend overload the lateral aspect of her foot.  She has minimal tenderness to palpation at the stress fracture.  She can stand on her toes bilaterally.  Imaging: No results found. No images are attached to the encounter.  Labs: Lab Results  Component Value Date   ESRSEDRATE 4 05/17/2017   LABURIC 6.1 05/17/2017   REPTSTATUS 11/11/2012 FINAL 11/08/2012   REPTSTATUS 11/13/2012 FINAL 11/08/2012   REPTSTATUS 11/08/2012 FINAL 11/08/2012   GRAMSTAIN  11/08/2012    RARE WBC PRESENT, PREDOMINANTLY MONONUCLEAR NO ORGANISMS SEEN Performed [facility] Gram Stain Report Called to,Read Back By and Verified With:  Gram Stain Report Called to,Read Back By and Verified With: OR @11 :50 ON 11/08/12 BY K SCHULTZ   GRAMSTAIN  11/08/2012    RARE WBC PRESENT, PREDOMINANTLY MONONUCLEAR NO ORGANISMS SEEN   GRAMSTAIN  11/08/2012    RARE WBC PRESENT, PREDOMINANTLY MONONUCLEAR NO ORGANISMS SEEN CALLED TO OR 11/08/12 1150 BY K SCHULTZ   CULT NO GROWTH 3 DAYS 11/08/2012   CULT NO ANAEROBES ISOLATED 11/08/2012    @LABSALLVALUES (HGBA1)@  Body mass index is 49.07 kg/m.  Orders:  Orders Placed This Encounter  Procedures  . XR Foot Complete Right   No orders of the defined types were placed in this encounter.    Procedures: No procedures performed  Clinical Data: No additional findings.  ROS:  All other systems negative, except as noted in the HPI. Review of Systems  Objective: Vital Signs: Ht 5\' 6"  (1.676 m)   Wt (!) 304 lb (137.9 kg)   BMI 49.07 kg/m   Specialty Comments:  No specialty comments available.  PMFS History: Patient Active Problem List   Diagnosis Date Noted  . Primary osteoarthritis of both hands 06/14/2017  . Status post total knee replacement, bilateral 05/17/2017  . DDD (degenerative disc disease), lumbar 05/17/2017  . Failed total right knee replacement (HCC) 11/10/2012  . Obesity, Class III, BMI 40-49.9 (morbid obesity) (HCC) 11/10/2012  . Endometrial polyp 09/23/2012    Class: Present on Admission  .  Hot flushes, perimenopausal 08/03/2012  . Menorrhagia 07/21/2012  . Menopause 07/13/2012  . Other and unspecified hyperlipidemia 07/13/2012  . Bereavement 07/13/2012  . Essential hypertension, benign 07/13/2012  . History of migraine 07/13/2012  . Hypothyroidism 07/13/2012   Past Medical History:  Diagnosis Date  . Arthritis    knees   . Hypertension    on meds, reviewed by Dr. Jacinto Halim for cardiac clearance - 09/2013, told that she was cleared & no need to follow up with him   . Hypothyroidism   . Mental disorder    perimenopause use of Paxil  . Thyroid disease    . Total knee replacement status     Family History  Problem Relation Age of Onset  . Hyperlipidemia Mother   . Hypertension Mother   . Heart disease Mother   . Kidney disease Mother   . Rheumatologic disease Mother   . Heart disease Father   . Cancer Father   . Heart disease Sister   . Hypertension Sister     Past Surgical History:  Procedure Laterality Date  . CARPAL TUNNEL RELEASE     2000, 2001, both hands  . DILATATION & CURRETTAGE/HYSTEROSCOPY WITH RESECTOCOPE  09/23/2012   Procedure: DILATATION & CURETTAGE/HYSTEROSCOPY WITH RESECTOCOPE;  Surgeon: Juluis Mire, MD;  Location: [facility];  Service: Gynecology;  Laterality: N/A;  . HAND SURGERY    . JOINT REPLACEMENT  2000, 2012,   knee replacement bilateral left-2000, 2012 right-2002  . TONSILLECTOMY    . TOTAL KNEE REVISION  11/08/2012   Procedure: TOTAL KNEE REVISION;  Surgeon: Valeria Batman, MD;  Location: [facility];  Service: Orthopedics;  Laterality: Right;  Revision Right Total Knee Replacement   . TUBAL LIGATION    . VAGINAL BIRTH AFTER CESAREAN SECTION     83, 85,87  . VAGINAL DELIVERY     [phone]   Social History   Occupational History  . Not on file  Tobacco Use  . Smoking status: Former Smoker    Last attempt to quit: 07/13/2008    Years since quitting: 9.3  . Smokeless tobacco: Never Used  Substance and Sexual Activity  . Alcohol use: No  . Drug use: No  . Sexual activity: Yes

## 2017-11-23 ENCOUNTER — Other Ambulatory Visit (INDEPENDENT_AMBULATORY_CARE_PROVIDER_SITE_OTHER): Payer: Self-pay | Admitting: Physical Medicine and Rehabilitation

## 2017-11-23 NOTE — Telephone Encounter (Signed)
Refill faxed to patient's pharmacy. 

## 2017-11-23 NOTE — Telephone Encounter (Signed)
Please advise 

## 2017-11-24 DIAGNOSIS — I10 Essential (primary) hypertension: Secondary | ICD-10-CM | POA: Diagnosis not present

## 2017-11-24 DIAGNOSIS — R7309 Other abnormal glucose: Secondary | ICD-10-CM | POA: Diagnosis not present

## 2017-11-24 DIAGNOSIS — E785 Hyperlipidemia, unspecified: Secondary | ICD-10-CM | POA: Diagnosis not present

## 2017-11-24 DIAGNOSIS — E039 Hypothyroidism, unspecified: Secondary | ICD-10-CM | POA: Diagnosis not present

## 2017-12-17 ENCOUNTER — Other Ambulatory Visit (INDEPENDENT_AMBULATORY_CARE_PROVIDER_SITE_OTHER): Payer: Self-pay | Admitting: Physical Medicine and Rehabilitation

## 2017-12-17 NOTE — Telephone Encounter (Signed)
Please advise 

## 2017-12-22 ENCOUNTER — Encounter (INDEPENDENT_AMBULATORY_CARE_PROVIDER_SITE_OTHER): Payer: Self-pay | Admitting: Orthopedic Surgery

## 2017-12-22 ENCOUNTER — Ambulatory Visit (INDEPENDENT_AMBULATORY_CARE_PROVIDER_SITE_OTHER): Payer: PPO

## 2017-12-22 ENCOUNTER — Ambulatory Visit (INDEPENDENT_AMBULATORY_CARE_PROVIDER_SITE_OTHER): Payer: PPO | Admitting: Orthopedic Surgery

## 2017-12-22 VITALS — Ht 66.0 in | Wt 304.0 lb

## 2017-12-22 DIAGNOSIS — M79671 Pain in right foot: Secondary | ICD-10-CM

## 2017-12-22 DIAGNOSIS — M84374S Stress fracture, right foot, sequela: Secondary | ICD-10-CM | POA: Diagnosis not present

## 2017-12-22 NOTE — Progress Notes (Signed)
Office Visit Note   Patient: Margaret [name]           Date of Birth: 06/04/1961           MRN: [id number] Visit Date: 12/22/2017              Requested by: Eliott NineLeague-Sobon, Jennifer, MD [address] [place], [place] [id number] PCP: Eliott NineLeague-Sobon, Jennifer, MD  Chief Complaint  Patient presents with  . Right Foot - Pain, Follow-up, Fracture    Rt 5th MT stress fx      HPI: Patient presents status post stress fracture base of the fifth metatarsal right foot.  Patient states she has pain with certain shoe wear and feels like she rolled her ankle over the side.  Assessment & Plan: Visit Diagnoses:  1. Pain in right foot   2. Fracture, stress, metatarsal, right, sequela     Plan: Recommended a felt pad to place laterally to posterior foot laterally.  Recommended a stiff soled walking sneaker versus Trail running sneaker.  Follow-Up Instructions: No Follow-up on file.   Ortho Exam  Patient is alert, oriented, no adenopathy, well-dressed, normal affect, normal respiratory effort. On examination patient has a good pulse she does have venous stasis changes the foot is plantigrade there are no ulcers.  With the patient walking she has laxity of her lateral ankle and rolls her foot into supination.  She has no pain but laxity of the peroneal brevis.  Imaging: Xr Foot Complete Right  Result Date: 12/22/2017 3 view radiographs of the right foot shows good callus formation and healing at the base of the fifth metatarsal right foot.  No images are attached to the encounter.  Labs: Lab Results  Component Value Date   ESRSEDRATE 4 05/17/2017   LABURIC 6.1 05/17/2017   REPTSTATUS 11/11/2012 FINAL 11/08/2012   REPTSTATUS 11/13/2012 FINAL 11/08/2012   REPTSTATUS 11/08/2012 FINAL 11/08/2012   GRAMSTAIN  11/08/2012    RARE WBC PRESENT, PREDOMINANTLY MONONUCLEAR NO ORGANISMS SEEN [facility] at [facility] Gram Stain Report Called to,Read Back By and Verified With: Gram Stain Report  Called to,Read Back By and Verified With: OR @11 :50 ON 11/08/12 BY K SCHULTZ   GRAMSTAIN  11/08/2012    RARE WBC PRESENT, PREDOMINANTLY MONONUCLEAR NO ORGANISMS SEEN   GRAMSTAIN  11/08/2012    RARE WBC PRESENT, PREDOMINANTLY MONONUCLEAR NO ORGANISMS SEEN CALLED TO OR 11/08/12 1150 BY K SCHULTZ   CULT NO GROWTH 3 DAYS 11/08/2012   CULT NO ANAEROBES ISOLATED 11/08/2012    @LABSALLVALUES (HGBA1)@  Body mass index is 49.07 kg/m.  Orders:  Orders Placed This Encounter  Procedures  . XR Foot Complete Right   No orders of the defined types were placed in this encounter.    Procedures: No procedures performed  Clinical Data: No additional findings.  ROS:  All other systems negative, except as noted in the HPI. Review of Systems  Objective: Vital Signs: Ht 5\' 6"  (1.676 m)   Wt (!) 304 lb (137.9 kg)   BMI 49.07 kg/m   Specialty Comments:  No specialty comments available.  PMFS History: Patient Active Problem List   Diagnosis Date Noted  . Primary osteoarthritis of both hands 06/14/2017  . Status post total knee replacement, bilateral 05/17/2017  . DDD (degenerative disc disease), lumbar 05/17/2017  . Failed total right knee replacement (HCC) 11/10/2012  . Obesity, Class III, BMI 40-49.9 (morbid obesity) (HCC) 11/10/2012  . Endometrial polyp 09/23/2012    Class: Present on Admission  .  Hot flushes, perimenopausal 08/03/2012  . Menorrhagia 07/21/2012  . Menopause 07/13/2012  . Other and unspecified hyperlipidemia 07/13/2012  . Bereavement 07/13/2012  . Essential hypertension, benign 07/13/2012  . History of migraine 07/13/2012  . Hypothyroidism 07/13/2012   Past Medical History:  Diagnosis Date  . Arthritis    knees   . Hypertension    on meds, reviewed by Dr. Jacinto Halim for cardiac clearance - 09/2013, told that she was cleared & no need to follow up with him   . Hypothyroidism   . Mental disorder    perimenopause use of Paxil  . Thyroid disease   . Total knee  replacement status     Family History  Problem Relation Age of Onset  . Hyperlipidemia Mother   . Hypertension Mother   . Heart disease Mother   . Kidney disease Mother   . Rheumatologic disease Mother   . Heart disease Father   . Cancer Father   . Heart disease Sister   . Hypertension Sister     Past Surgical History:  Procedure Laterality Date  . CARPAL TUNNEL RELEASE     2000, 2001, both hands  . DILATATION & CURRETTAGE/HYSTEROSCOPY WITH RESECTOCOPE  09/23/2012   Procedure: DILATATION & CURETTAGE/HYSTEROSCOPY WITH RESECTOCOPE;  Surgeon: Juluis Mire, MD;  Location: [facility];  Service: Gynecology;  Laterality: N/A;  . HAND SURGERY    . JOINT REPLACEMENT  2000, 2012,   knee replacement bilateral left-2000, 2012 right-2002  . TONSILLECTOMY    . TOTAL KNEE REVISION  11/08/2012   Procedure: TOTAL KNEE REVISION;  Surgeon: Valeria Batman, MD;  Location: [facility] OR;  Service: Orthopedics;  Laterality: Right;  Revision Right Total Knee Replacement   . TUBAL LIGATION    . VAGINAL BIRTH AFTER CESAREAN SECTION     83, 85,87  . VAGINAL DELIVERY     [phone]   Social History   Occupational History  . Not on file  Tobacco Use  . Smoking status: Former Smoker    Last attempt to quit: 07/13/2008    Years since quitting: 9.4  . Smokeless tobacco: Never Used  Substance and Sexual Activity  . Alcohol use: No  . Drug use: No  . Sexual activity: Yes

## 2018-01-25 ENCOUNTER — Other Ambulatory Visit (INDEPENDENT_AMBULATORY_CARE_PROVIDER_SITE_OTHER): Payer: Self-pay | Admitting: Physical Medicine and Rehabilitation

## 2018-01-26 NOTE — Telephone Encounter (Signed)
Please advise 

## 2018-02-22 ENCOUNTER — Telehealth (INDEPENDENT_AMBULATORY_CARE_PROVIDER_SITE_OTHER): Payer: Self-pay | Admitting: Physical Medicine and Rehabilitation

## 2018-02-22 NOTE — Telephone Encounter (Signed)
Has been 9 months since RFA, so I would OV to see what is going on then get auth for re-do RFA

## 2018-02-22 NOTE — Telephone Encounter (Signed)
Scheduled for OV 5/28.

## 2018-02-24 ENCOUNTER — Other Ambulatory Visit (INDEPENDENT_AMBULATORY_CARE_PROVIDER_SITE_OTHER): Payer: Self-pay | Admitting: Physical Medicine and Rehabilitation

## 2018-02-24 NOTE — Telephone Encounter (Signed)
Please advise 

## 2018-02-24 NOTE — Telephone Encounter (Signed)
I will refill but we need OV to discus and re-eval, not seen since 11/18 but has been seeing Lajoyce Corners

## 2018-03-08 ENCOUNTER — Other Ambulatory Visit: Payer: Self-pay

## 2018-03-09 ENCOUNTER — Encounter: Payer: Self-pay | Admitting: Podiatry

## 2018-03-09 ENCOUNTER — Ambulatory Visit (INDEPENDENT_AMBULATORY_CARE_PROVIDER_SITE_OTHER): Payer: PPO

## 2018-03-09 ENCOUNTER — Telehealth: Payer: Self-pay | Admitting: *Deleted

## 2018-03-09 ENCOUNTER — Ambulatory Visit: Payer: PPO | Admitting: Podiatry

## 2018-03-09 DIAGNOSIS — M79672 Pain in left foot: Principal | ICD-10-CM

## 2018-03-09 DIAGNOSIS — M79604 Pain in right leg: Secondary | ICD-10-CM

## 2018-03-09 DIAGNOSIS — M722 Plantar fascial fibromatosis: Secondary | ICD-10-CM

## 2018-03-09 DIAGNOSIS — M79605 Pain in left leg: Secondary | ICD-10-CM | POA: Diagnosis not present

## 2018-03-09 DIAGNOSIS — M79671 Pain in right foot: Secondary | ICD-10-CM | POA: Diagnosis not present

## 2018-03-09 DIAGNOSIS — S92351K Displaced fracture of fifth metatarsal bone, right foot, subsequent encounter for fracture with nonunion: Secondary | ICD-10-CM

## 2018-03-09 NOTE — Telephone Encounter (Signed)
Left message informing Gillie Manners - Exogen pt needed Bone Growth Stimulator.

## 2018-03-09 NOTE — Telephone Encounter (Signed)
-----   Message from Felecia Shelling, DPM sent at 03/09/2018  2:04 PM EDT ----- Regarding: Exogen bone stimulator Can we get this patient approved for a bone stimulator.   Diagnosis: Nonunion Jones fracture right foot  Thanks, Dr. Logan Bores

## 2018-03-09 NOTE — Progress Notes (Addendum)
Patient ID: Margaret [name], female   DOB: [birth year], 57 y.o.   MRN: [id number]   HPI: 57 year old female presents the office today for evaluation regarding some right foot pain that is been ongoing for greater than 1 year.  Patient states that the injury initially occurred approximately a year and a half ago when she made a quick step and felt immediate pain in her right foot.  She eventually went to be seen by Dr. Lajoyce Corners, orthopedist, at which time she was diagnosed with a Jones fracture.  She is gone through immobilization in a cam boot with rest and elevation however she continues to have significant pain and tenderness to the Jones fracture area.  She is now suffering from some left foot and ankle pain secondary to compensation.  She presents today for further treatment evaluation  Past Medical History:  Diagnosis Date  . Arthritis    knees   . Hypertension    on meds, reviewed by Dr. Jacinto Halim for cardiac clearance - 09/2013, told that she was cleared & no need to follow up with him   . Hypothyroidism   . Mental disorder    perimenopause use of Paxil  . Thyroid disease   . Total knee replacement status      Physical Exam: General: The patient is alert and oriented x3 in no acute distress.  Dermatology: Skin is warm, dry and supple bilateral lower extremities. Negative for open lesions or macerations.  Vascular: Palpable pedal pulses bilaterally. No edema or erythema noted. Capillary refill within normal limits.  Neurological: Epicritic and protective threshold grossly intact bilaterally.   Musculoskeletal Exam: Range of motion within normal limits to all pedal and ankle joints bilateral. Muscle strength 5/5 in all groups bilateral.  Significant pain on palpation overlying the fifth metatarsal of the right foot.  There is also some pain on palpation to the bilateral medial calcaneal tubercles at the insertion of the plantar fascia.  Radiographic Exam:  Normal osseous mineralization.   Transverse fracture at the metaphyseal diaphyseal junction of the fifth metatarsal right foot with callus formation noted.  Fracture appears to be chronic in nature however the fracture is not healed and nonunion is visualized.  Fracture gap approximately 4 mm with callus formation.  Assessment: 1.  Nonunion fifth metatarsal right foot Jones fracture despite conservative modalities of treatment 2.  Plantar fasciitis bilateral   Plan of Care:  1. Patient evaluated. X-Rays reviewed.  2.  Today we are going to pursue additional conservative modalities to help heal the fracture.  The patient states that her husband requires a lot of care since he was recently diagnosed and treated for lung cancer.  She is not a position to have surgery and go through the postoperative recovery period. 3.  Today we are going to request authorization for an exogen bone stimulator.  Medically necessary.  Fracture greater than 6 months old. 4.  Appointment with Raiford Noble for custom molded orthotics to alleviate plantar fascial pain 5.  Return to clinic in 6 weeks      Felecia Shelling, DPM [facility]  Dr. Felecia Shelling, DPM    [address] [place], [place] [address]  Office [phone]  Fax [phone]

## 2018-03-09 NOTE — Patient Instructions (Signed)
Pre-Operative Instructions  Congratulations, you have decided to take an important step towards improving your quality of life.  You can be assured that the doctors and staff at [facility] will be with you every step of the way.  Here are some important things you should know:  1. Plan to be at the surgery center/hospital at least 1 (one) hour prior to your scheduled time, unless otherwise directed by the surgical center/hospital staff.  You must have a responsible adult accompany you, remain during the surgery and drive you home.  Make sure you have directions to the surgical center/hospital to ensure you arrive on time. 2. If you are having surgery at [facility] or [facility] hospitals, you will need a copy of your medical history and physical form from your family physician within one month prior to the date of surgery. We will give you a form for your primary physician to complete.  3. We make every effort to accommodate the date you request for surgery.  However, there are times where surgery dates or times have to be moved.  We will contact you as soon as possible if a change in schedule is required.   4. No aspirin/ibuprofen for one week before surgery.  If you are on aspirin, any non-steroidal anti-inflammatory medications (Mobic, Aleve, Ibuprofen) should not be taken seven (7) days prior to your surgery.  You make take Tylenol for pain prior to surgery.  5. Medications - If you are taking daily heart and blood pressure medications, seizure, reflux, allergy, asthma, anxiety, pain or diabetes medications, make sure you notify the surgery center/hospital before the day of surgery so they can tell you which medications you should take or avoid the day of surgery. 6. No food or drink after midnight the night before surgery unless directed otherwise by surgical center/hospital staff. 7. No alcoholic beverages 24-hours prior to surgery.  No smoking 24-hours prior or 24-hours after  surgery. 8. Wear loose pants or shorts. They should be loose enough to fit over bandages, boots, and casts. 9. Don't wear slip-on shoes. Sneakers are preferred. 10. Bring your boot with you to the surgery center/hospital.  Also bring crutches or a walker if your physician has prescribed it for you.  If you do not have this equipment, it will be provided for you after surgery. 11. If you have not been contacted by the surgery center/hospital by the day before your surgery, call to confirm the date and time of your surgery. 12. Leave-time from work may vary depending on the type of surgery you have.  Appropriate arrangements should be made prior to surgery with your employer. 13. Prescriptions will be provided immediately following surgery by your doctor.  Fill these as soon as possible after surgery and take the medication as directed. Pain medications will not be refilled on weekends and must be approved by the doctor. 14. Remove nail polish on the operative foot and avoid getting pedicures prior to surgery. 15. Wash the night before surgery.  The night before surgery wash the foot and leg well with water and the antibacterial soap provided. Be sure to pay special attention to beneath the toenails and in between the toes.  Wash for at least three (3) minutes. Rinse thoroughly with water and dry well with a towel.  Perform this wash unless told not to do so by your physician.  Enclosed: 1 Ice pack (please put in freezer the night before surgery)   1 Hibiclens skin cleaner     Pre-op instructions  If you have any questions regarding the instructions, please do not hesitate to call our office.  [place]: [address], [place], [place] [address] -- [phone]  [place]: [address] [place], [place] [address] -- [phone]  [place]: [address]  [place], [place] [address] -- [phone]  [place]: [address], [address], [place],  [address] -- [phone]  Website:  [email or web address] 

## 2018-03-09 NOTE — Progress Notes (Signed)
   Subjective:    Patient ID: Margaret [name], female    DOB: May 11, 1961, 57 y.o.   MRN: [id number]  HPI    Review of Systems  All other systems reviewed and are negative.      Objective:   Physical Exam        Assessment & Plan:

## 2018-03-15 ENCOUNTER — Ambulatory Visit (INDEPENDENT_AMBULATORY_CARE_PROVIDER_SITE_OTHER): Payer: PPO | Admitting: Physical Medicine and Rehabilitation

## 2018-03-15 ENCOUNTER — Telehealth (INDEPENDENT_AMBULATORY_CARE_PROVIDER_SITE_OTHER): Payer: Self-pay | Admitting: Physical Medicine and Rehabilitation

## 2018-03-15 ENCOUNTER — Encounter (INDEPENDENT_AMBULATORY_CARE_PROVIDER_SITE_OTHER): Payer: Self-pay | Admitting: Physical Medicine and Rehabilitation

## 2018-03-15 VITALS — BP 155/77 | HR 56

## 2018-03-15 DIAGNOSIS — G894 Chronic pain syndrome: Secondary | ICD-10-CM | POA: Diagnosis not present

## 2018-03-15 DIAGNOSIS — M545 Low back pain: Secondary | ICD-10-CM

## 2018-03-15 DIAGNOSIS — M47816 Spondylosis without myelopathy or radiculopathy, lumbar region: Secondary | ICD-10-CM

## 2018-03-15 DIAGNOSIS — G8929 Other chronic pain: Secondary | ICD-10-CM | POA: Diagnosis not present

## 2018-03-15 NOTE — Progress Notes (Signed)
Numeric Pain Rating Scale and Functional Assessment Average Pain 6 Pain Right Now 4 My pain is intermittent, sharp, burning, stabbing and aching Pain is worse with: walking, bending and standing Pain improves with: rest, heat/ice, medication and injections   In the last MONTH (on 0-10 scale) has pain interfered with the following?  1. General activity like being  able to carry out your everyday physical activities such as walking, climbing stairs, carrying groceries, or moving a chair?  Rating(5)  2. Relation with others like being able to carry out your usual social activities and roles such as  activities at home, at work and in your community. Rating(3)  3. Enjoyment of life such that you have  been bothered by emotional problems such as feeling anxious, depressed or irritable?  Rating(3)

## 2018-03-15 NOTE — Progress Notes (Signed)
YOHANNA TOW - 57 y.o. female MRN [id number]  Date of birth: January 27, 1961  Office Visit Note: Visit Date: 03/15/2018 PCP: Eliott Nine, MD Referred by: Eliott Nine,*  Subjective: Chief Complaint  Patient presents with  . Lower Back - Pain   HPI: Mrs. Duman is a 57 year old female that comes in today with several month worsening chronic severe axial low back pain.  She reports no specific incident to the increase in pain.  She has had no injury or trauma.  She reports pain is axial bilaterally right equal to left.  She is not getting referral pain down the legs.  She reports tingling somewhat in the lower feet but she thinks this is been because of her bilateral knee replacements.  She has had to have both knee replacements performed twice.  She reports most of her pain is worse with weightbearing activities and bending at the waist.  She is been using some heat and ice as well as laying flat and that does seem to help at times but is temporary.  She has been using tramadol which we have been providing for.  We have essentially been giving her 40 tablets of tramadol per month.  I did review the [place] controlled substance database again and she is only getting that medication from me.  Prior to that her primary care physician was doing this.  She has chronic issues of foot pain and knee pain and back pain.  She is morbidly obese with BMI 49.1.  She is not a diabetic.  She has had no focal weakness or radicular complaints.  In November 2018 we completed radiofrequency ablation of the L4-5 and L5-S1 facet joints.  She did extremely well with that up until just now over the last couple of months I think her pain is returned and it does seem to be the same pain across the low back worse with standing and ambulating.  She has had no bowel or bladder changes no red flag complaints.  No reason to really get new imaging at this point.  She has had prior lumbar spine MRI.  She is  had no prior lumbar surgery.   Review of Systems  Constitutional: Negative for chills, fever, malaise/fatigue and weight loss.  HENT: Negative for hearing loss and sinus pain.   Eyes: Negative for blurred vision, double vision and photophobia.  Respiratory: Negative for cough and shortness of breath.   Cardiovascular: Negative for chest pain, palpitations and leg swelling.  Gastrointestinal: Negative for abdominal pain, nausea and vomiting.  Genitourinary: Negative for flank pain.  Musculoskeletal: Positive for back pain and joint pain. Negative for myalgias.  Skin: Negative for itching and rash.  Neurological: Positive for tingling. Negative for tremors, focal weakness and weakness.  Endo/Heme/Allergies: Negative.   Psychiatric/Behavioral: Negative for depression.  All other systems reviewed and are negative.  Otherwise per HPI.  Assessment & Plan: Visit Diagnoses:  1. Spondylosis without myelopathy or radiculopathy, lumbar region   2. Chronic bilateral low back pain without sciatica   3. Chronic pain syndrome   4. Obesity, Class III, BMI 40-49.9 (morbid obesity) (HCC)     Plan: Findings:  Chronic history of chronic severe low back pain mainly related to facet arthropathy at L4-5 and L5-S1.  Patient underwent double diagnostic blocks in the past and radiofrequency ablation of the L4-5 and L5-S1 facet joints with good relief of her symptoms up until just recently.  Over the last 2 months she has had worsening axial  low back pain with exam consistent with pain with facet joint loading.  She has had no new trauma otherwise no radicular pain or weakness.  I think the best approach is to repeat the radiofrequency ablation of the above facet joints.  According to Medicare standards and the patient does have Medicare we should be able to repeat this at a six-month interval but she is really had over 18 months of relief.  Otherwise we will go ahead and refill on an ongoing basis the small amount  of tramadol that she takes.  I do not have an active pain agreement in place but I have checked that database of controlled substances for her and she is only getting the medication from [place].  There is no aberrant use or reasons to think otherwise with her we have gone over this at length today.  She continues to see Dr. Lajoyce Corners for her foot which was a Jones fracture stress fracture on the right.  She has had a second opinion by the podiatrist.  She continues to follow with Dr. Cleophas Dunker for her knees.    Meds & Orders: No orders of the defined types were placed in this encounter.  No orders of the defined types were placed in this encounter.   Follow-up: Return for Radiofrequency ablation of the L4-5 and L5-S1 facet joints.   Procedures: No procedures performed  No notes on file   Clinical History: MRI LUMBAR SPINE WITHOUT CONTRAST  TECHNIQUE: Multiplanar, multisequence MR imaging of the lumbar spine was performed. No intravenous contrast was administered.  COMPARISON:  01/31/2014  FINDINGS: Segmentation:  Standard.  Alignment:  Physiologic.  Vertebrae:  No fracture, evidence of discitis, or bone lesion.  Conus medullaris: Extends to the T12 level and appears normal.  Paraspinal and other soft tissues: No paraspinal abnormality.  Disc levels:  Disc spaces: Degenerative disc disease disc height loss at T11-12, L1-2, L3-4, L4-5 and L5-S1.  T11-12: Small central disc protrusion. No foraminal or central canal stenosis.  T12-L1: No significant disc bulge. No evidence of neural foraminal stenosis. No central canal stenosis.  L1-L2: Broad-based disc bulge flattening the ventral thecal sac. Mild bilateral facet arthropathy. No evidence of neural foraminal stenosis. No central canal stenosis.  L2-L3: No significant disc bulge. No evidence of neural foraminal stenosis. No central canal stenosis.  L3-L4: Broad-based disc bulge flattening the ventral thecal sac  and eccentric towards the left. Mild bilateral facet arthropathy. Mild left foraminal stenosis. No right foraminal stenosis. No central canal stenosis.  L4-L5: Mild broad-based disc bulge with a small left foraminal disc protrusion. Moderate bilateral facet arthropathy. Mild right and moderate left foraminal stenosis. Bilateral lateral recess narrowing. No central canal stenosis.  L5-S1: Mild broad-based disc bulge. Moderate bilateral facet arthropathy. Moderate bilateral foraminal narrowing. No central canal stenosis.  IMPRESSION: 1. Diffuse lumbar spine spondylosis as described above. 2. At L4-5 there is a mild broad-based disc bulge with a small left foraminal disc protrusion. Moderate bilateral facet arthropathy. Mild right and moderate left foraminal stenosis. Bilateral lateral recess narrowing.   Electronically Signed   By: Elige Ko   On: 03/17/2017 14:58   She reports that she quit smoking about 9 years ago. She has never used smokeless tobacco.  Recent Labs    05/17/17 1106  LABURIC 6.1    Objective:  VS:  HT:    WT:   BMI:     BP:(!) 155/77  HR:(!) 56bpm  TEMP: ( )  RESP:  Physical Exam  Constitutional: She is oriented to person, place, and time. She appears well-developed and well-nourished. No distress.  Obese  HENT:  Head: Normocephalic and atraumatic.  Nose: Nose normal.  Mouth/Throat: Oropharynx is clear and moist.  Eyes: Pupils are equal, round, and reactive to light. Conjunctivae are normal.  Neck: Normal range of motion. Neck supple.  Cardiovascular: Regular rhythm and intact distal pulses.  Pulmonary/Chest: Effort normal. No respiratory distress.  Abdominal: She exhibits no distension. There is no guarding.  Musculoskeletal: She exhibits edema.  Patient ambulates without aid with a slightly antalgic gait on the right.  She is somewhat slow to rise from a seated position due to her knees but also pain with full extension and rotation and  facet joint loading of the lumbar spine.  No pain to palpation along the paraspinal musculature PSIS.  No pain with hip rotation.  Good distal strength without clonus.  Neurological: She is alert and oriented to person, place, and time. She exhibits normal muscle tone. Coordination normal.  Skin: Skin is warm. No rash noted. No erythema.  Psychiatric: She has a normal mood and affect. Her behavior is normal.  Nursing note and vitals reviewed.   Ortho Exam Imaging: No results found.  Past Medical/Family/Surgical/Social History: Medications & Allergies reviewed per EMR, new medications updated. Patient Active Problem List   Diagnosis Date Noted  . Primary osteoarthritis of both hands 06/14/2017  . Status post total knee replacement, bilateral 05/17/2017  . DDD (degenerative disc disease), lumbar 05/17/2017  . Backache 11/22/2013  . Leukocytosis 03/25/2013  . Abnormal electrocardiogram 03/25/2013  . Failed total right knee replacement (HCC) 11/10/2012  . Obesity, Class III, BMI 40-49.9 (morbid obesity) (HCC) 11/10/2012  . Endometrial polyp 09/23/2012    Class: Present on Admission  . Hot flushes, perimenopausal 08/03/2012  . Menorrhagia 07/21/2012  . Menopause 07/13/2012  . Other and unspecified hyperlipidemia 07/13/2012  . Bereavement 07/13/2012  . Essential hypertension, benign 07/13/2012  . History of migraine 07/13/2012  . Hypothyroidism 07/13/2012   Past Medical History:  Diagnosis Date  . Arthritis    knees   . Hypertension    on meds, reviewed by Dr. Jacinto Halim for cardiac clearance - 09/2013, told that she was cleared & no need to follow up with him   . Hypothyroidism   . Mental disorder    perimenopause use of Paxil  . Thyroid disease   . Total knee replacement status    Family History  Problem Relation Age of Onset  . Hyperlipidemia Mother   . Hypertension Mother   . Heart disease Mother   . Kidney disease Mother   . Rheumatologic disease Mother   . Heart disease  Father   . Cancer Father   . Heart disease Sister   . Hypertension Sister    Past Surgical History:  Procedure Laterality Date  . CARPAL TUNNEL RELEASE     2000, 2001, both hands  . DILATATION & CURRETTAGE/HYSTEROSCOPY WITH RESECTOCOPE  09/23/2012   Procedure: DILATATION & CURETTAGE/HYSTEROSCOPY WITH RESECTOCOPE;  Surgeon: Juluis Mire, MD;  Location: [facility];  Service: Gynecology;  Laterality: N/A;  . HAND SURGERY    . JOINT REPLACEMENT  2000, 2012,   knee replacement bilateral left-2000, 2012 right-2002  . TONSILLECTOMY    . TOTAL KNEE REVISION  11/08/2012   Procedure: TOTAL KNEE REVISION;  Surgeon: Valeria Batman, MD;  Location: [facility] OR;  Service: Orthopedics;  Laterality: Right;  Revision Right Total Knee Replacement   . TUBAL LIGATION    .  VAGINAL BIRTH AFTER CESAREAN SECTION     84, 85,88  . VAGINAL DELIVERY     [phone]   Social History   Occupational History  . Not on file  Tobacco Use  . Smoking status: Former Smoker    Last attempt to quit: 07/13/2008    Years since quitting: 9.6  . Smokeless tobacco: Never Used  Substance and Sexual Activity  . Alcohol use: No  . Drug use: No  . Sexual activity: Yes

## 2018-03-16 ENCOUNTER — Telehealth (INDEPENDENT_AMBULATORY_CARE_PROVIDER_SITE_OTHER): Payer: Self-pay | Admitting: *Deleted

## 2018-03-16 NOTE — Telephone Encounter (Signed)
Called [facility] and spoke with Shutel C. [facility]FA does not need pre auth. Pt is scheduled for 7/9 & 7/16.

## 2018-03-17 ENCOUNTER — Ambulatory Visit (INDEPENDENT_AMBULATORY_CARE_PROVIDER_SITE_OTHER): Payer: PPO | Admitting: Orthotics

## 2018-03-17 DIAGNOSIS — M79671 Pain in right foot: Principal | ICD-10-CM

## 2018-03-17 DIAGNOSIS — M79605 Pain in left leg: Principal | ICD-10-CM

## 2018-03-17 DIAGNOSIS — M79604 Pain in right leg: Secondary | ICD-10-CM

## 2018-03-17 DIAGNOSIS — M722 Plantar fascial fibromatosis: Secondary | ICD-10-CM

## 2018-03-17 DIAGNOSIS — M79672 Pain in left foot: Principal | ICD-10-CM

## 2018-03-17 DIAGNOSIS — S92351K Displaced fracture of fifth metatarsal bone, right foot, subsequent encounter for fracture with nonunion: Secondary | ICD-10-CM

## 2018-03-17 NOTE — Progress Notes (Signed)
Patient came into today to be cast for [employer] [facility]. Upon recommendation of Dr. Amalia Hailey Patient presents with Margaret [name] fx, r, RF varus, FF varus, plantar f Goals are offloading lateral pressure of RT foot by 6* RF valgus wedge, 3* FF valgus wedge, met head 5 cut out. Cuboid sweet spot.  Plan vendor [employer]

## 2018-03-19 DIAGNOSIS — J00 Acute nasopharyngitis [common cold]: Secondary | ICD-10-CM | POA: Diagnosis not present

## 2018-03-19 DIAGNOSIS — J069 Acute upper respiratory infection, unspecified: Secondary | ICD-10-CM | POA: Diagnosis not present

## 2018-03-22 ENCOUNTER — Other Ambulatory Visit (INDEPENDENT_AMBULATORY_CARE_PROVIDER_SITE_OTHER): Payer: Self-pay | Admitting: Physical Medicine and Rehabilitation

## 2018-03-22 NOTE — Telephone Encounter (Signed)
Please advise 

## 2018-03-30 DIAGNOSIS — Z1231 Encounter for screening mammogram for malignant neoplasm of breast: Secondary | ICD-10-CM | POA: Diagnosis not present

## 2018-03-30 DIAGNOSIS — Z01419 Encounter for gynecological examination (general) (routine) without abnormal findings: Secondary | ICD-10-CM | POA: Diagnosis not present

## 2018-03-30 DIAGNOSIS — Z6841 Body Mass Index (BMI) 40.0 and over, adult: Secondary | ICD-10-CM | POA: Diagnosis not present

## 2018-04-12 ENCOUNTER — Ambulatory Visit: Payer: PPO | Admitting: Orthotics

## 2018-04-12 ENCOUNTER — Telehealth: Payer: Self-pay | Admitting: *Deleted

## 2018-04-12 DIAGNOSIS — M19042 Primary osteoarthritis, left hand: Secondary | ICD-10-CM

## 2018-04-12 DIAGNOSIS — S92354K Nondisplaced fracture of fifth metatarsal bone, right foot, subsequent encounter for fracture with nonunion: Secondary | ICD-10-CM | POA: Diagnosis not present

## 2018-04-12 DIAGNOSIS — M722 Plantar fascial fibromatosis: Secondary | ICD-10-CM | POA: Diagnosis not present

## 2018-04-12 DIAGNOSIS — M79604 Pain in right leg: Secondary | ICD-10-CM

## 2018-04-12 DIAGNOSIS — M79605 Pain in left leg: Principal | ICD-10-CM

## 2018-04-12 DIAGNOSIS — M19041 Primary osteoarthritis, right hand: Secondary | ICD-10-CM

## 2018-04-12 DIAGNOSIS — M79671 Pain in right foot: Principal | ICD-10-CM

## 2018-04-12 DIAGNOSIS — S92351K Displaced fracture of fifth metatarsal bone, right foot, subsequent encounter for fracture with nonunion: Secondary | ICD-10-CM

## 2018-04-12 DIAGNOSIS — M79672 Pain in left foot: Principal | ICD-10-CM

## 2018-04-12 NOTE — Telephone Encounter (Signed)
Margaret [name] - [facility] states [facility] is in-network. I told Margaret [name] I would get the paper work for [facility] going.

## 2018-04-12 NOTE — Telephone Encounter (Signed)
Tiffany - [employer] states Bioventus bone growth stimulator is out of network and not approved.

## 2018-04-12 NOTE — Progress Notes (Signed)
Patient came in today to p/up f/o.  She has severe lateral ankle instablitly; while the f/o help take pressure off of 5th met R; her "roll out" still needs correction.  I suggest she have a 5* wedge added TO THE SHOE (modification).  She may come back next week with a script from [facility] for the same.

## 2018-04-12 NOTE — Telephone Encounter (Signed)
Left message informing K. Winter - [facility] we would be sending a [facility] pt, once I had gotten the fracture gap measurement.

## 2018-04-15 NOTE — Telephone Encounter (Signed)
Faxed required forms x2, clinicals and demographics to [facility].

## 2018-04-18 ENCOUNTER — Other Ambulatory Visit (INDEPENDENT_AMBULATORY_CARE_PROVIDER_SITE_OTHER): Payer: Self-pay | Admitting: Physical Medicine and Rehabilitation

## 2018-04-18 NOTE — Telephone Encounter (Signed)
Ok refill? 

## 2018-04-18 NOTE — Telephone Encounter (Signed)
Please advise. Dr. Newton is out of the office. 

## 2018-04-19 NOTE — Telephone Encounter (Signed)
Bernette RedbirdKenny Winter - [facility] called for Dr. Lajoyce Cornersuda and other notes. faxed requested notes with the Addendum of the 4.760mm fracture gap note of 03/09/2018.

## 2018-04-20 ENCOUNTER — Other Ambulatory Visit (INDEPENDENT_AMBULATORY_CARE_PROVIDER_SITE_OTHER): Payer: Self-pay | Admitting: Physical Medicine and Rehabilitation

## 2018-04-20 ENCOUNTER — Ambulatory Visit: Payer: PPO | Admitting: Podiatry

## 2018-04-26 ENCOUNTER — Encounter (INDEPENDENT_AMBULATORY_CARE_PROVIDER_SITE_OTHER): Payer: Self-pay | Admitting: Physical Medicine and Rehabilitation

## 2018-04-26 ENCOUNTER — Ambulatory Visit (INDEPENDENT_AMBULATORY_CARE_PROVIDER_SITE_OTHER): Payer: Self-pay

## 2018-04-26 ENCOUNTER — Encounter

## 2018-04-26 ENCOUNTER — Ambulatory Visit (INDEPENDENT_AMBULATORY_CARE_PROVIDER_SITE_OTHER): Payer: PPO | Admitting: Physical Medicine and Rehabilitation

## 2018-04-26 VITALS — BP 134/61 | HR 50

## 2018-04-26 DIAGNOSIS — M47816 Spondylosis without myelopathy or radiculopathy, lumbar region: Secondary | ICD-10-CM

## 2018-04-26 MED ORDER — METHYLPREDNISOLONE ACETATE 80 MG/ML IJ SUSP
80.0000 mg | Freq: Once | INTRAMUSCULAR | Status: AC
  Administered 2018-04-26: 80 mg

## 2018-04-26 NOTE — Progress Notes (Signed)
 .  Numeric Pain Rating Scale and Functional Assessment Average Pain 6   In the last MONTH (on 0-10 scale) has pain interfered with the following?  1. General activity like being  able to carry out your everyday physical activities such as walking, climbing stairs, carrying groceries, or moving a chair?  Rating(4)   +Driver, -BT, -Dye Allergies.  

## 2018-05-03 ENCOUNTER — Ambulatory Visit (INDEPENDENT_AMBULATORY_CARE_PROVIDER_SITE_OTHER): Payer: PPO | Admitting: Physical Medicine and Rehabilitation

## 2018-05-03 ENCOUNTER — Ambulatory Visit (INDEPENDENT_AMBULATORY_CARE_PROVIDER_SITE_OTHER): Payer: Self-pay

## 2018-05-03 ENCOUNTER — Encounter (INDEPENDENT_AMBULATORY_CARE_PROVIDER_SITE_OTHER): Payer: Self-pay | Admitting: Physical Medicine and Rehabilitation

## 2018-05-03 ENCOUNTER — Encounter

## 2018-05-03 VITALS — BP 168/74 | HR 50

## 2018-05-03 DIAGNOSIS — M47816 Spondylosis without myelopathy or radiculopathy, lumbar region: Secondary | ICD-10-CM

## 2018-05-03 DIAGNOSIS — G8929 Other chronic pain: Secondary | ICD-10-CM

## 2018-05-03 DIAGNOSIS — M545 Low back pain, unspecified: Secondary | ICD-10-CM

## 2018-05-03 MED ORDER — METHYLPREDNISOLONE ACETATE 80 MG/ML IJ SUSP: 40.0000 mg | Freq: Once | INTRAMUSCULAR | Status: DC

## 2018-05-03 NOTE — Progress Notes (Signed)
.  Numeric Pain Rating Scale and Functional Assessment Average Pain 4   In the last MONTH (on 0-10 scale) has pain interfered with the following?  1. General activity like being  able to carry out your everyday physical activities such as walking, climbing stairs, carrying groceries, or moving a chair?  Rating(1)   +Driver, -BT, -Dye Allergies.  

## 2018-05-03 NOTE — Patient Instructions (Signed)

## 2018-05-03 NOTE — Telephone Encounter (Signed)
Bernette RedbirdKenny - [facility] states pt has Exogen Bone Growth stimulator, currently.

## 2018-05-04 ENCOUNTER — Other Ambulatory Visit: Payer: Self-pay | Admitting: Podiatry

## 2018-05-04 DIAGNOSIS — M722 Plantar fascial fibromatosis: Secondary | ICD-10-CM

## 2018-05-06 NOTE — Procedures (Signed)
Lumbar Facet Joint Nerve Denervation  Patient: Margaret [name]      Date of Birth: 11/25/1960 MRN: [id number] PCP: Eliott NineLeague-Sobon, Jennifer, MD      Visit Date: 05/03/2018   Universal Protocol:    Date/Time: 07/19/196:20 AM  Consent Given By: the patient  Position: PRONE  Additional Comments: Vital signs were monitored before and after the procedure. Patient was prepped and draped in the usual sterile fashion. The correct patient, procedure, and site was verified.   Injection Procedure Details:  Procedure Site One Meds Administered:  Meds ordered this encounter  Medications  . methylPREDNISolone acetate (DEPO-MEDROL) injection 40 mg     Laterality: Left  Location/Site: Left L3 and L4 medial branches and left L5 dorsal ramus L4-L5 L5-S1  Needle size: 18 G  Needle type: Radiofrequency cannula  Needle Placement: Along juncture of superior articular process and transverse pocess  Findings:  -Comments:  Procedure Details: For each desired target nerve, the corresponding transverse process (sacral ala for the L5 dorsal rami) was identified and the fluoroscope was positioned to square off the endplates of the corresponding vertebral body to achieve a true AP midline view.  The beam was then obliqued 15 to 20 degrees and caudally tilted 15 to 20 degrees to line up a trajectory along the target nerves. The skin over the target of the junction of superior articulating process and transverse process (sacral ala for the L5 dorsal rami) was infiltrated with 1ml of 1% Lidocaine without Epinephrine.  The 18 gauge 10mm active tip outer cannula was advanced in trajectory view to the target.  This procedure was repeated for each target nerve.  Then, for all levels, the outer cannula placement was fine-tuned and the position was then confirmed with bi-planar imaging.    Test stimulation was done both at sensory and motor levels to ensure there was no radicular stimulation. The target  tissues were then infiltrated with 1 ml of 1% Lidocaine without Epinephrine. Subsequently, a percutaneous neurotomy was carried out for 60 seconds at 80 degrees Celsius. The procedure was repeated with the cannula rotated 90 degrees, for duration of 60 seconds, one additional time at each level for a total of two lesions per level.  After the completion of the two lesions, 1 ml of injectate was delivered. It was then repeated for each facet joint nerve mentioned above. Appropriate radiographs were obtained to verify the probe placement during the neurotomy.   Additional Comments:  The patient tolerated the procedure well Dressing: Band-Aid    Post-procedure details: Patient was observed during the procedure. Post-procedure instructions were reviewed.  Patient left the clinic in stable condition.

## 2018-05-06 NOTE — Progress Notes (Signed)
Margaret [name] - 57 y.o. female MRN [id number]  Date of birth: 03/16/1961  Office Visit Note: Visit Date: 05/03/2018 PCP: Eliott NineLeague-Sobon, Jennifer, MD Referred by: Eliott NineLeague-Sobon, Jennifer,*  Subjective: Chief Complaint  Patient presents with  . Lower Back - Pain   HPI: Margaret [name] is a 57 year old female with chronic axial low back pain that has been relieved with radiofrequency ablation of the L4-5 and L5-S1 facet joints.  Prior ablation gave her really great relief for about 8 months.  We have recently repeated the ablation on the right side since she had more right-sided pain and this is already feeling much better so we are going to proceed with the left side.   ROS Otherwise per HPI.  Assessment & Plan: Visit Diagnoses:  1. Spondylosis without myelopathy or radiculopathy, lumbar region   2. Chronic left-sided low back pain without sciatica     Plan: No additional findings.   Meds & Orders:  Meds ordered this encounter  Medications  . methylPREDNISolone acetate (DEPO-MEDROL) injection 40 mg    Orders Placed This Encounter  Procedures  . Radiofrequency,Lumbar  . XR C-ARM NO REPORT    Follow-up: Return if symptoms worsen or fail to improve.   Procedures: No procedures performed  Lumbar Facet Joint Nerve Denervation  Patient: Margaret DienerDeborah H [name]      Date of Birth: 04/17/1961 MRN: [id number] PCP: Eliott NineLeague-Sobon, Jennifer, MD      Visit Date: 05/03/2018   Universal Protocol:    Date/Time: 07/19/196:20 AM  Consent Given By: the patient  Position: PRONE  Additional Comments: Vital signs were monitored before and after the procedure. Patient was prepped and draped in the usual sterile fashion. The correct patient, procedure, and site was verified.   Injection Procedure Details:  Procedure Site One Meds Administered:  Meds ordered this encounter  Medications  . methylPREDNISolone acetate (DEPO-MEDROL) injection 40 mg     Laterality: Left  Location/Site: Left L3  and L4 medial branches and left L5 dorsal ramus L4-L5 L5-S1  Needle size: 18 G  Needle type: Radiofrequency cannula  Needle Placement: Along juncture of superior articular process and transverse pocess  Findings:  -Comments:  Procedure Details: For each desired target nerve, the corresponding transverse process (sacral ala for the L5 dorsal rami) was identified and the fluoroscope was positioned to square off the endplates of the corresponding vertebral body to achieve a true AP midline view.  The beam was then obliqued 15 to 20 degrees and caudally tilted 15 to 20 degrees to line up a trajectory along the target nerves. The skin over the target of the junction of superior articulating process and transverse process (sacral ala for the L5 dorsal rami) was infiltrated with 1ml of 1% Lidocaine without Epinephrine.  The 18 gauge 10mm active tip outer cannula was advanced in trajectory view to the target.  This procedure was repeated for each target nerve.  Then, for all levels, the outer cannula placement was fine-tuned and the position was then confirmed with bi-planar imaging.    Test stimulation was done both at sensory and motor levels to ensure there was no radicular stimulation. The target tissues were then infiltrated with 1 ml of 1% Lidocaine without Epinephrine. Subsequently, a percutaneous neurotomy was carried out for 60 seconds at 80 degrees Celsius. The procedure was repeated with the cannula rotated 90 degrees, for duration of 60 seconds, one additional time at each level for a total of two lesions per level.  After the completion of the  two lesions, 1 ml of injectate was delivered. It was then repeated for each facet joint nerve mentioned above. Appropriate radiographs were obtained to verify the probe placement during the neurotomy.   Additional Comments:  The patient tolerated the procedure well Dressing: Band-Aid    Post-procedure details: Patient was observed during the  procedure. Post-procedure instructions were reviewed.  Patient left the clinic in stable condition.      Clinical History: MRI LUMBAR SPINE WITHOUT CONTRAST  TECHNIQUE: Multiplanar, multisequence MR imaging of the lumbar spine was performed. No intravenous contrast was administered.  COMPARISON:  01/31/2014  FINDINGS: Segmentation:  Standard.  Alignment:  Physiologic.  Vertebrae:  No fracture, evidence of discitis, or bone lesion.  Conus medullaris: Extends to the T12 level and appears normal.  Paraspinal and other soft tissues: No paraspinal abnormality.  Disc levels:  Disc spaces: Degenerative disc disease disc height loss at T11-12, L1-2, L3-4, L4-5 and L5-S1.  T11-12: Small central disc protrusion. No foraminal or central canal stenosis.  T12-L1: No significant disc bulge. No evidence of neural foraminal stenosis. No central canal stenosis.  L1-L2: Broad-based disc bulge flattening the ventral thecal sac. Mild bilateral facet arthropathy. No evidence of neural foraminal stenosis. No central canal stenosis.  L2-L3: No significant disc bulge. No evidence of neural foraminal stenosis. No central canal stenosis.  L3-L4: Broad-based disc bulge flattening the ventral thecal sac and eccentric towards the left. Mild bilateral facet arthropathy. Mild left foraminal stenosis. No right foraminal stenosis. No central canal stenosis.  L4-L5: Mild broad-based disc bulge with a small left foraminal disc protrusion. Moderate bilateral facet arthropathy. Mild right and moderate left foraminal stenosis. Bilateral lateral recess narrowing. No central canal stenosis.  L5-S1: Mild broad-based disc bulge. Moderate bilateral facet arthropathy. Moderate bilateral foraminal narrowing. No central canal stenosis.  IMPRESSION: 1. Diffuse lumbar spine spondylosis as described above. 2. At L4-5 there is a mild broad-based disc bulge with a small left foraminal  disc protrusion. Moderate bilateral facet arthropathy. Mild right and moderate left foraminal stenosis. Bilateral lateral recess narrowing.   Electronically Signed   By: Elige Ko   On: 03/17/2017 14:58   She reports that she quit smoking about 9 years ago. She has never used smokeless tobacco.  Recent Labs    05/17/17 1106  LABURIC 6.1    Objective:  VS:  HT:    WT:   BMI:     BP:(!) 168/74  HR:(!) 50bpm  TEMP: ( )  RESP:  Physical Exam  Ortho Exam Imaging: No results found.  Past Medical/Family/Surgical/Social History: Medications & Allergies reviewed per EMR, new medications updated. Patient Active Problem List   Diagnosis Date Noted  . Primary osteoarthritis of both hands 06/14/2017  . Status post total knee replacement, bilateral 05/17/2017  . DDD (degenerative disc disease), lumbar 05/17/2017  . Backache 11/22/2013  . Leukocytosis 03/25/2013  . Abnormal electrocardiogram 03/25/2013  . Failed total right knee replacement (HCC) 11/10/2012  . Obesity, Class III, BMI 40-49.9 (morbid obesity) (HCC) 11/10/2012  . Endometrial polyp 09/23/2012    Class: Present on Admission  . Hot flushes, perimenopausal 08/03/2012  . Menorrhagia 07/21/2012  . Menopause 07/13/2012  . Other and unspecified hyperlipidemia 07/13/2012  . Bereavement 07/13/2012  . Essential hypertension, benign 07/13/2012  . History of migraine 07/13/2012  . Hypothyroidism 07/13/2012   Past Medical History:  Diagnosis Date  . Arthritis    knees   . Hypertension    on meds, reviewed by Dr. Jacinto Halim for cardiac clearance -  09/2013, told that she was cleared & no need to follow up with him   . Hypothyroidism   . Mental disorder    perimenopause use of Paxil  . Thyroid disease   . Total knee replacement status    Family History  Problem Relation Age of Onset  . Hyperlipidemia Mother   . Hypertension Mother   . Heart disease Mother   . Kidney disease Mother   . Rheumatologic disease Mother     . Heart disease Father   . Cancer Father   . Heart disease Sister   . Hypertension Sister    Past Surgical History:  Procedure Laterality Date  . CARPAL TUNNEL RELEASE     2000, 2001, both hands  . DILATATION & CURRETTAGE/HYSTEROSCOPY WITH RESECTOCOPE  09/23/2012   Procedure: DILATATION & CURETTAGE/HYSTEROSCOPY WITH RESECTOCOPE;  Surgeon: Juluis Mire, MD;  Location: [facility];  Service: Gynecology;  Laterality: N/A;  . HAND SURGERY    . JOINT REPLACEMENT  2000, 2012,   knee replacement bilateral left-2000, 2012 right-2002  . TONSILLECTOMY    . TOTAL KNEE REVISION  11/08/2012   Procedure: TOTAL KNEE REVISION;  Surgeon: Valeria Batman, MD;  Location: [facility];  Service: Orthopedics;  Laterality: Right;  Revision Right Total Knee Replacement   . TUBAL LIGATION    . VAGINAL BIRTH AFTER CESAREAN SECTION     83, 85,87  . VAGINAL DELIVERY     [phone]   Social History   Occupational History  . Not on file  Tobacco Use  . Smoking status: Former Smoker    Last attempt to quit: 07/13/2008    Years since quitting: 9.8  . Smokeless tobacco: Never Used  Substance and Sexual Activity  . Alcohol use: No  . Drug use: No  . Sexual activity: Yes

## 2018-05-06 NOTE — Progress Notes (Signed)
Margaret [name] - 57 y.o. female MRN [id number]  Date of birth: 10/12/1961  Office Visit Note: Visit Date: 04/26/2018 PCP: Eliott NineLeague-Sobon, Jennifer, MD Referred by: Eliott NineLeague-Sobon, Jennifer,*  Subjective: Chief Complaint  Patient presents with  . Lower Back - Pain   HPI: [name] is a 57 year old female with chronic history of back pain and chronic pain syndrome and multiple orthopedic complaints.  We have seen on a few occasions and ultimately completed radiofrequency ablation of the L4-5 and L5-S1 facet joints bilaterally back in September of last year.  She got excellent relief up until a couple months ago.  Please see our prior notes for further details and justification but when she comes in today for repeat ablation.  She got approximately 8 months relief with the prior procedure.  Her symptoms are much more prominent on the right than the left.  The L4-5 facet joint is fairly arthritic on the right compared to left.  We will do the right side today.   ROS Otherwise per HPI.  Assessment & Plan: Visit Diagnoses:  1. Spondylosis without myelopathy or radiculopathy, lumbar region     Plan: No additional findings.   Meds & Orders:  Meds ordered this encounter  Medications  . methylPREDNISolone acetate (DEPO-MEDROL) injection 80 mg    Orders Placed This Encounter  Procedures  . Radiofrequency,Lumbar  . XR C-ARM NO REPORT    Follow-up: No follow-ups on file.   Procedures: No procedures performed  Lumbar Facet Joint Nerve Denervation  Patient: Margaret [name]      Date of Birth: 01/25/1961 MRN: [id number] PCP: Eliott NineLeague-Sobon, Jennifer, MD      Visit Date: 04/26/2018   Universal Protocol:    Date/Time: 07/19/196:17 AM  Consent Given By: the patient  Position: PRONE  Additional Comments: Vital signs were monitored before and after the procedure. Patient was prepped and draped in the usual sterile fashion. The correct patient, procedure, and site was  verified.   Injection Procedure Details:  Procedure Site One Meds Administered:  Meds ordered this encounter  Medications  . methylPREDNISolone acetate (DEPO-MEDROL) injection 80 mg     Laterality: Right  Location/Site: Right L3 and L4 medial branches and right L5 dorsal rami L4-L5 L5-S1  Needle size: 18 G  Needle type: Radiofrequency cannula  Needle Placement: Along juncture of superior articular process and transverse pocess  Findings:  -Comments:  Procedure Details: For each desired target nerve, the corresponding transverse process (sacral ala for the L5 dorsal rami) was identified and the fluoroscope was positioned to square off the endplates of the corresponding vertebral body to achieve a true AP midline view.  The beam was then obliqued 15 to 20 degrees and caudally tilted 15 to 20 degrees to line up a trajectory along the target nerves. The skin over the target of the junction of superior articulating process and transverse process (sacral ala for the L5 dorsal rami) was infiltrated with 1ml of 1% Lidocaine without Epinephrine.  The 18 gauge 10mm active tip outer cannula was advanced in trajectory view to the target.  This procedure was repeated for each target nerve.  Then, for all levels, the outer cannula placement was fine-tuned and the position was then confirmed with bi-planar imaging.    Test stimulation was done both at sensory and motor levels to ensure there was no radicular stimulation. The target tissues were then infiltrated with 1 ml of 1% Lidocaine without Epinephrine. Subsequently, a percutaneous neurotomy was carried out for 60 seconds at  80 degrees Celsius. The procedure was repeated with the cannula rotated 90 degrees, for duration of 60 seconds, one additional time at each level for a total of two lesions per level.  After the completion of the two lesions, 1 ml of injectate was delivered. It was then repeated for each facet joint nerve mentioned above.  Appropriate radiographs were obtained to verify the probe placement during the neurotomy.   Additional Comments:  The patient tolerated the procedure well Dressing: Band-Aid    Post-procedure details: Patient was observed during the procedure. Post-procedure instructions were reviewed.  Patient left the clinic in stable condition.      Clinical History: MRI LUMBAR SPINE WITHOUT CONTRAST  TECHNIQUE: Multiplanar, multisequence MR imaging of the lumbar spine was performed. No intravenous contrast was administered.  COMPARISON:  01/31/2014  FINDINGS: Segmentation:  Standard.  Alignment:  Physiologic.  Vertebrae:  No fracture, evidence of discitis, or bone lesion.  Conus medullaris: Extends to the T12 level and appears normal.  Paraspinal and other soft tissues: No paraspinal abnormality.  Disc levels:  Disc spaces: Degenerative disc disease disc height loss at T11-12, L1-2, L3-4, L4-5 and L5-S1.  T11-12: Small central disc protrusion. No foraminal or central canal stenosis.  T12-L1: No significant disc bulge. No evidence of neural foraminal stenosis. No central canal stenosis.  L1-L2: Broad-based disc bulge flattening the ventral thecal sac. Mild bilateral facet arthropathy. No evidence of neural foraminal stenosis. No central canal stenosis.  L2-L3: No significant disc bulge. No evidence of neural foraminal stenosis. No central canal stenosis.  L3-L4: Broad-based disc bulge flattening the ventral thecal sac and eccentric towards the left. Mild bilateral facet arthropathy. Mild left foraminal stenosis. No right foraminal stenosis. No central canal stenosis.  L4-L5: Mild broad-based disc bulge with a small left foraminal disc protrusion. Moderate bilateral facet arthropathy. Mild right and moderate left foraminal stenosis. Bilateral lateral recess narrowing. No central canal stenosis.  L5-S1: Mild broad-based disc bulge. Moderate bilateral  facet arthropathy. Moderate bilateral foraminal narrowing. No central canal stenosis.  IMPRESSION: 1. Diffuse lumbar spine spondylosis as described above. 2. At L4-5 there is a mild broad-based disc bulge with a small left foraminal disc protrusion. Moderate bilateral facet arthropathy. Mild right and moderate left foraminal stenosis. Bilateral lateral recess narrowing.   Electronically Signed   By: Elige Ko   On: 03/17/2017 14:58   She reports that she quit smoking about 9 years ago. She has never used smokeless tobacco.  Recent Labs    05/17/17 1106  LABURIC 6.1    Objective:  VS:  HT:    WT:   BMI:     BP:134/61  HR:(!) 50bpm  TEMP: ( )  RESP:  Physical Exam  Ortho Exam Imaging: No results found.  Past Medical/Family/Surgical/Social History: Medications & Allergies reviewed per EMR, new medications updated. Patient Active Problem List   Diagnosis Date Noted  . Primary osteoarthritis of both hands 06/14/2017  . Status post total knee replacement, bilateral 05/17/2017  . DDD (degenerative disc disease), lumbar 05/17/2017  . Backache 11/22/2013  . Leukocytosis 03/25/2013  . Abnormal electrocardiogram 03/25/2013  . Failed total right knee replacement (HCC) 11/10/2012  . Obesity, Class III, BMI 40-49.9 (morbid obesity) (HCC) 11/10/2012  . Endometrial polyp 09/23/2012    Class: Present on Admission  . Hot flushes, perimenopausal 08/03/2012  . Menorrhagia 07/21/2012  . Menopause 07/13/2012  . Other and unspecified hyperlipidemia 07/13/2012  . Bereavement 07/13/2012  . Essential hypertension, benign 07/13/2012  . History of  migraine 07/13/2012  . Hypothyroidism 07/13/2012   Past Medical History:  Diagnosis Date  . Arthritis    knees   . Hypertension    on meds, reviewed by Dr. Jacinto Halim for cardiac clearance - 09/2013, told that she was cleared & no need to follow up with him   . Hypothyroidism   . Mental disorder    perimenopause use of Paxil  .  Thyroid disease   . Total knee replacement status    Family History  Problem Relation Age of Onset  . Hyperlipidemia Mother   . Hypertension Mother   . Heart disease Mother   . Kidney disease Mother   . Rheumatologic disease Mother   . Heart disease Father   . Cancer Father   . Heart disease Sister   . Hypertension Sister    Past Surgical History:  Procedure Laterality Date  . CARPAL TUNNEL RELEASE     2000, 2001, both hands  . DILATATION & CURRETTAGE/HYSTEROSCOPY WITH RESECTOCOPE  09/23/2012   Procedure: DILATATION & CURETTAGE/HYSTEROSCOPY WITH RESECTOCOPE;  Surgeon: Juluis Mire, MD;  Location: [facility];  Service: Gynecology;  Laterality: N/A;  . HAND SURGERY    . JOINT REPLACEMENT  2000, 2012,   knee replacement bilateral left-2000, 2012 right-2002  . TONSILLECTOMY    . TOTAL KNEE REVISION  11/08/2012   Procedure: TOTAL KNEE REVISION;  Surgeon: Valeria Batman, MD;  Location: [facility] OR;  Service: Orthopedics;  Laterality: Right;  Revision Right Total Knee Replacement   . TUBAL LIGATION    . VAGINAL BIRTH AFTER CESAREAN SECTION     83, 85,87  . VAGINAL DELIVERY     [phone]   Social History   Occupational History  . Not on file  Tobacco Use  . Smoking status: Former Smoker    Last attempt to quit: 07/13/2008    Years since quitting: 9.8  . Smokeless tobacco: Never Used  Substance and Sexual Activity  . Alcohol use: No  . Drug use: No  . Sexual activity: Yes

## 2018-05-06 NOTE — Procedures (Signed)
Lumbar Facet Joint Nerve Denervation  Patient: Margaret DienerDeborah H [name]      Date of Birth: 11/23/1960 MRN: [id number] PCP: Eliott NineLeague-Sobon, Jennifer, MD      Visit Date: 04/26/2018   Universal Protocol:    Date/Time: 07/19/196:17 AM  Consent Given By: the patient  Position: PRONE  Additional Comments: Vital signs were monitored before and after the procedure. Patient was prepped and draped in the usual sterile fashion. The correct patient, procedure, and site was verified.   Injection Procedure Details:  Procedure Site One Meds Administered:  Meds ordered this encounter  Medications  . methylPREDNISolone acetate (DEPO-MEDROL) injection 80 mg     Laterality: Right  Location/Site: Right L3 and L4 medial branches and right L5 dorsal rami L4-L5 L5-S1  Needle size: 18 G  Needle type: Radiofrequency cannula  Needle Placement: Along juncture of superior articular process and transverse pocess  Findings:  -Comments:  Procedure Details: For each desired target nerve, the corresponding transverse process (sacral ala for the L5 dorsal rami) was identified and the fluoroscope was positioned to square off the endplates of the corresponding vertebral body to achieve a true AP midline view.  The beam was then obliqued 15 to 20 degrees and caudally tilted 15 to 20 degrees to line up a trajectory along the target nerves. The skin over the target of the junction of superior articulating process and transverse process (sacral ala for the L5 dorsal rami) was infiltrated with 1ml of 1% Lidocaine without Epinephrine.  The 18 gauge 10mm active tip outer cannula was advanced in trajectory view to the target.  This procedure was repeated for each target nerve.  Then, for all levels, the outer cannula placement was fine-tuned and the position was then confirmed with bi-planar imaging.    Test stimulation was done both at sensory and motor levels to ensure there was no radicular stimulation. The target  tissues were then infiltrated with 1 ml of 1% Lidocaine without Epinephrine. Subsequently, a percutaneous neurotomy was carried out for 60 seconds at 80 degrees Celsius. The procedure was repeated with the cannula rotated 90 degrees, for duration of 60 seconds, one additional time at each level for a total of two lesions per level.  After the completion of the two lesions, 1 ml of injectate was delivered. It was then repeated for each facet joint nerve mentioned above. Appropriate radiographs were obtained to verify the probe placement during the neurotomy.   Additional Comments:  The patient tolerated the procedure well Dressing: Band-Aid    Post-procedure details: Patient was observed during the procedure. Post-procedure instructions were reviewed.  Patient left the clinic in stable condition.

## 2018-05-06 NOTE — Telephone Encounter (Signed)
error 

## 2018-05-10 DIAGNOSIS — N950 Postmenopausal bleeding: Secondary | ICD-10-CM | POA: Diagnosis not present

## 2018-05-11 NOTE — H&P (Signed)
Patient name Guadlupe SpanishMullis, Alexia DICTATION# [id number] CSN# [id number]  [facility] CenterMCCOMB,Valgene Deloatch S, MD 05/11/2018 7:04 AM

## 2018-05-12 NOTE — H&P (Unsigned)
NAMHessie [name]: [name], Richele H. MEDICAL RECORD [id number] ACCOUNT [id number] DATE OF BIRTH:Dec 03, 1960 FACILITY: [facility] LOCATION:  PHYSICIAN:Eros Montour Lisbeth PlyS. Alam Guterrez, MD  HISTORY AND PHYSICAL  DATE OF ADMISSION:  05/25/2018  HISTORY OF PRESENT ILLNESS:  The patient is a 57 year old gravida 3, para 3, postmenopausal patient who was last seen in our practice in 2017.  She was subsequently seen for annual on 06/12 of this year.  She had been on hormone replacement therapy.  For  3 months, she was using just estrogen and recently started back on progesterone.  She was having vaginal bleeding off and on.  Of note, she had a previous saline infusion ultrasound with finding of endometrial polyp in 2013.  This was benign.  She  underwent a repeat saline infusion ultrasound in 2016.  Endometrial biopsy was negative.  She underwent an ultrasound in the office on 07/23.  She had a markedly thickened endometrium.  Due to the patient's habitus and position of the cervix, we had a  very difficult time performing a saline infusion ultrasound or endometrial biopsy.  Because of this, we are bringing her to the hospital at the present time to undergo a hysteroscopy, D and C under general anesthesia.  Again, indications are difficulty  in obtaining endometrial sampling given her size.  ALLERGIES:  SHE IS ALLERGIC TO CODEINE.  MEDICATIONS:  She is on metoprolol.  Lisinopril with hydrochlorothiazide 20/25.  Levothyroxine 200 mcg.  Estradiol ____ mg daily.  Progesterone 100 mg daily. Fenofibrate 54 mg.  Gabapentin.  Meloxicam.  Tramadol.  Bupropion.  PAST MEDICAL HISTORY:  She has had 3 vaginal deliveries.  Being managed for hypertension, history of diverticulosis and thyroid disease.  She has also had right and left knee replacement and carpal tunnel surgery on both hands.  SOCIAL HISTORY:  No tobacco abuse.  FAMILY HISTORY:  Noncontributory.  REVIEW OF SYSTEMS:  Noncontributory.  PHYSICAL EXAMINATION: VITAL SIGNS:  The  patient is afebrile, stable vital signs. HEENT:  The patient is normocephalic.  Pupils equal, round, reactive to light and accommodation.  Extraocular movements are intact.  Sclerae and conjunctivae are clear.  Oropharynx is clear. CARDIOVASCULAR:  Regular rate.  No murmurs or gallops.  No carotid or abdominal bruits. ABDOMEN:  Her abdominal exam is benign.  No masses, organomegaly or tenderness. PELVIC:  Normal external genitalia.  Vaginal mucosa is clear.  Cervix unremarkable.  Uterus upper limits of normal size.  Adnexa unremarkable. EXTREMITIES:  Trace edema.    IMPRESSION: 1.  On exogenous hormone replacement therapy with abnormal uterine bleeding and thickened endometrium. 2.  Hypertension. 3.  Hypothyroidism. 4.  Inability to do in-office evaluation of uterine thickness.  PLAN:  The patient to undergo under general anesthesia hysteroscopy with MyoSure resection along with D and C for evaluation of the endometrium.  The nature of the procedure has been discussed.  The risks have been explained including the risk of  infection, the risk of hemorrhage that could require transfusion with the risk of AIDS and hepatitis.  Excessive bleeding could require hysterectomy, its risk of perforation with injury to adjacent organs, the risk of deep venous thrombosis and pulmonary  embolus.  The patient expressed understanding of potential risks and complications.  LN/NUANCE  D:05/11/2018 T:05/11/2018 JOB:[id number]

## 2018-05-16 NOTE — Patient Instructions (Addendum)
Your procedure is scheduled on:  Wednesday, 8/7  [facility]: 6 am  Pick up the phone at the desk and dial [phone].  Call this number if you have problems the morning of surgery: [phone].  Remember: Do NOT eat food or Do NOT drink clear liquids (including water) after midnight Tuesday.  Brush your teeth on the morning of surgery.  Take these medicines the morning of surgery with a SIP OF WATER: wellbutrin, levothyroxine, metoprolol, estrace, tramadol  Stop herbal medications, vitamin supplements, Ibuprofen/NSAIDS at this time.  Do NOT wear jewelry (body piercing), metal hair clips/bobby pins, make-up, or nail polish. Do NOT wear lotions, powders, or perfumes.  You may wear deoderant. Do NOT shave for 48 hours prior to surgery. Do NOT bring valuables to the hospital. Dentures may not be worn into surgery.  Have a responsible adult drive you home and stay with you for 24 hours after your procedure.   Home with Husband Fayrene FearingJames cell [phone].

## 2018-05-18 ENCOUNTER — Encounter (HOSPITAL_COMMUNITY): Payer: Self-pay

## 2018-05-18 ENCOUNTER — Encounter (HOSPITAL_COMMUNITY)
Admission: RE | Admit: 2018-05-18 | Discharge: 2018-05-18 | Disposition: A | Discharge status: Home / Self Care | Payer: PPO | Source: Ambulatory Visit | Attending: Obstetrics and Gynecology | Admitting: Obstetrics and Gynecology

## 2018-05-18 ENCOUNTER — Ambulatory Visit: Payer: PPO | Admitting: Podiatry

## 2018-05-18 ENCOUNTER — Other Ambulatory Visit: Payer: Self-pay

## 2018-05-18 DIAGNOSIS — Z01812 Encounter for preprocedural laboratory examination: Secondary | ICD-10-CM | POA: Insufficient documentation

## 2018-05-18 DIAGNOSIS — R001 Bradycardia, unspecified: Secondary | ICD-10-CM | POA: Diagnosis not present

## 2018-05-18 DIAGNOSIS — Z01810 Encounter for preprocedural cardiovascular examination: Secondary | ICD-10-CM | POA: Insufficient documentation

## 2018-05-18 HISTORY — DX: Presence of dental prosthetic device (complete) (partial): Z97.2

## 2018-05-18 HISTORY — DX: Hyperlipidemia, unspecified: E78.5

## 2018-05-18 HISTORY — DX: Anxiety disorder, unspecified: F41.9

## 2018-05-18 HISTORY — DX: Major depressive disorder, single episode, unspecified: F32.9

## 2018-05-18 HISTORY — DX: Depression, unspecified: F32.A

## 2018-05-18 LAB — CBC
HCT: 40.8 % (ref 36.0–46.0)
Hemoglobin: 13.4 g/dL (ref 12.0–15.0)
MCH: 29.1 pg (ref 26.0–34.0)
MCHC: 32.8 g/dL (ref 30.0–36.0)
MCV: 88.5 fL (ref 78.0–100.0)
Platelets: 244 10*3/uL (ref 150–400)
RBC: 4.61 MIL/uL (ref 3.87–5.11)
RDW: 13 % (ref 11.5–15.5)
WBC: 9.1 10*3/uL (ref 4.0–10.5)

## 2018-05-18 LAB — BASIC METABOLIC PANEL
Anion gap: 10 (ref 5–15)
BUN: 21 mg/dL — ABNORMAL HIGH (ref 6–20)
CO2: 28 mmol/L (ref 22–32)
Calcium: 9.2 mg/dL (ref 8.9–10.3)
Chloride: 99 mmol/L (ref 98–111)
Creatinine, Ser: 0.59 mg/dL (ref 0.44–1.00)
GFR calc Af Amer: >60 mL/min (ref >60)
GFR calc non Af Amer: >60 mL/min (ref >60)
Glucose, Bld: 107 mg/dL — ABNORMAL HIGH (ref 70–99)
Potassium: 4.4 mmol/L (ref 3.5–5.1)
Sodium: 137 mmol/L (ref 135–145)

## 2018-05-18 LAB — TYPE AND SCREEN
ABO/RH(D): A POS
Antibody Screen: NEGATIVE

## 2018-05-18 LAB — ABO/RH: ABO/RH(D): A POS

## 2018-05-24 ENCOUNTER — Other Ambulatory Visit (INDEPENDENT_AMBULATORY_CARE_PROVIDER_SITE_OTHER): Payer: Self-pay | Admitting: Physical Medicine and Rehabilitation

## 2018-05-24 NOTE — Telephone Encounter (Signed)
Please advise 

## 2018-05-25 ENCOUNTER — Ambulatory Visit (HOSPITAL_COMMUNITY): Payer: PPO | Admitting: Anesthesiology

## 2018-05-25 ENCOUNTER — Encounter (HOSPITAL_COMMUNITY): Payer: Self-pay

## 2018-05-25 ENCOUNTER — Ambulatory Visit (HOSPITAL_COMMUNITY)
Admission: AD | Admit: 2018-05-25 | Discharge: 2018-05-25 | Disposition: A | Discharge status: Home / Self Care | Payer: PPO | Source: Ambulatory Visit | Attending: Obstetrics and Gynecology | Admitting: Obstetrics and Gynecology

## 2018-05-25 ENCOUNTER — Other Ambulatory Visit: Payer: Self-pay

## 2018-05-25 ENCOUNTER — Encounter (HOSPITAL_COMMUNITY)
Admission: AD | Disposition: A | Discharge status: Home / Self Care | Payer: Self-pay | Source: Ambulatory Visit | Attending: Obstetrics and Gynecology

## 2018-05-25 DIAGNOSIS — Z79890 Hormone replacement therapy (postmenopausal): Secondary | ICD-10-CM | POA: Diagnosis not present

## 2018-05-25 DIAGNOSIS — N938 Other specified abnormal uterine and vaginal bleeding: Secondary | ICD-10-CM | POA: Diagnosis not present

## 2018-05-25 DIAGNOSIS — I10 Essential (primary) hypertension: Secondary | ICD-10-CM | POA: Diagnosis not present

## 2018-05-25 DIAGNOSIS — Z79899 Other long term (current) drug therapy: Secondary | ICD-10-CM | POA: Diagnosis not present

## 2018-05-25 DIAGNOSIS — N939 Abnormal uterine and vaginal bleeding, unspecified: Secondary | ICD-10-CM | POA: Insufficient documentation

## 2018-05-25 DIAGNOSIS — N8500 Endometrial hyperplasia, unspecified: Secondary | ICD-10-CM | POA: Diagnosis not present

## 2018-05-25 DIAGNOSIS — R9389 Abnormal findings on diagnostic imaging of other specified body structures: Secondary | ICD-10-CM | POA: Insufficient documentation

## 2018-05-25 DIAGNOSIS — E039 Hypothyroidism, unspecified: Secondary | ICD-10-CM | POA: Insufficient documentation

## 2018-05-25 HISTORY — PX: DILATATION & CURETTAGE/HYSTEROSCOPY WITH MYOSURE: SHX6511

## 2018-05-25 LAB — SURGICAL PCR SCREEN
MRSA, PCR: NEGATIVE
Staphylococcus aureus: NEGATIVE

## 2018-05-25 SURGERY — DILATATION & CURETTAGE/HYSTEROSCOPY WITH MYOSURE
Anesthesia: General | Site: "Vagina "

## 2018-05-25 MED ORDER — PROPOFOL 10 MG/ML IV BOLUS
INTRAVENOUS | Status: AC
  Filled 2018-05-25: qty 20

## 2018-05-25 MED ORDER — SCOPOLAMINE 1 MG/3DAYS TD PT72
1.0000 | MEDICATED_PATCH | Freq: Once | TRANSDERMAL | Status: DC
  Administered 2018-05-25: 1.5 mg via TRANSDERMAL

## 2018-05-25 MED ORDER — ONDANSETRON HCL 4 MG/2ML IJ SOLN
INTRAMUSCULAR | Status: AC
  Filled 2018-05-25: qty 2

## 2018-05-25 MED ORDER — ACETAMINOPHEN 500 MG PO TABS
1000.0000 mg | ORAL_TABLET | Freq: Once | ORAL | Status: AC
  Administered 2018-05-25: 1000 mg via ORAL

## 2018-05-25 MED ORDER — DEXAMETHASONE SODIUM PHOSPHATE 4 MG/ML IJ SOLN
INTRAMUSCULAR | Status: AC
  Filled 2018-05-25: qty 1

## 2018-05-25 MED ORDER — SCOPOLAMINE 1 MG/3DAYS TD PT72
MEDICATED_PATCH | TRANSDERMAL | Status: AC
  Administered 2018-05-25: 1.5 mg via TRANSDERMAL
  Filled 2018-05-25: qty 1

## 2018-05-25 MED ORDER — MIDAZOLAM HCL 2 MG/2ML IJ SOLN
INTRAMUSCULAR | Status: AC
  Filled 2018-05-25: qty 2

## 2018-05-25 MED ORDER — DEXTROSE 5 % IV SOLN
3.0000 g | INTRAVENOUS | Status: AC
  Administered 2018-05-25: 3 g via INTRAVENOUS
  Filled 2018-05-25: qty 3000

## 2018-05-25 MED ORDER — LIDOCAINE HCL 1 % IJ SOLN
INTRAMUSCULAR | Status: AC
  Filled 2018-05-25: qty 20

## 2018-05-25 MED ORDER — FENTANYL CITRATE (PF) 100 MCG/2ML IJ SOLN
INTRAMUSCULAR | Status: DC | PRN
  Administered 2018-05-25 (×2): 50 ug via INTRAVENOUS

## 2018-05-25 MED ORDER — LIDOCAINE HCL 1 % IJ SOLN
INTRAMUSCULAR | Status: DC | PRN
  Administered 2018-05-25: 16 mL

## 2018-05-25 MED ORDER — LIDOCAINE HCL (CARDIAC) PF 100 MG/5ML IV SOSY
PREFILLED_SYRINGE | INTRAVENOUS | Status: DC | PRN
  Administered 2018-05-25: 100 mg via INTRAVENOUS

## 2018-05-25 MED ORDER — FENTANYL CITRATE (PF) 100 MCG/2ML IJ SOLN
INTRAMUSCULAR | Status: AC
  Filled 2018-05-25: qty 2

## 2018-05-25 MED ORDER — LACTATED RINGERS IV SOLN
INTRAVENOUS | Status: DC
  Administered 2018-05-25: 07:00:00 via INTRAVENOUS

## 2018-05-25 MED ORDER — SODIUM CHLORIDE 0.9 % IR SOLN
Status: DC | PRN
  Administered 2018-05-25: 3000 mL

## 2018-05-25 MED ORDER — EPHEDRINE SULFATE 50 MG/ML IJ SOLN
INTRAMUSCULAR | Status: DC | PRN
  Administered 2018-05-25: 10 mg via INTRAVENOUS

## 2018-05-25 MED ORDER — FENTANYL CITRATE (PF) 100 MCG/2ML IJ SOLN
25.0000 ug | INTRAMUSCULAR | Status: DC | PRN
Start: 2018-05-25 — End: 2018-05-25

## 2018-05-25 MED ORDER — LIDOCAINE HCL (CARDIAC) PF 100 MG/5ML IV SOSY
PREFILLED_SYRINGE | INTRAVENOUS | Status: AC
  Filled 2018-05-25: qty 5

## 2018-05-25 MED ORDER — DEXAMETHASONE SODIUM PHOSPHATE 10 MG/ML IJ SOLN
INTRAMUSCULAR | Status: DC | PRN
  Administered 2018-05-25: 4 mg via INTRAVENOUS

## 2018-05-25 MED ORDER — ONDANSETRON HCL 4 MG/2ML IJ SOLN
INTRAMUSCULAR | Status: DC | PRN
  Administered 2018-05-25: 4 mg via INTRAVENOUS

## 2018-05-25 MED ORDER — MIDAZOLAM HCL 2 MG/2ML IJ SOLN
INTRAMUSCULAR | Status: DC | PRN
Start: 2018-05-25 — End: 2018-05-25
  Administered 2018-05-25: 2 mg via INTRAVENOUS

## 2018-05-25 MED ORDER — EPHEDRINE 5 MG/ML INJ
INTRAVENOUS | Status: AC
  Filled 2018-05-25: qty 10

## 2018-05-25 MED ORDER — CEFAZOLIN SODIUM 10 G IJ SOLR
INTRAMUSCULAR | Status: AC
  Filled 2018-05-25: qty 3000

## 2018-05-25 MED ORDER — PROPOFOL 10 MG/ML IV BOLUS
INTRAVENOUS | Status: DC | PRN
  Administered 2018-05-25: 200 mg via INTRAVENOUS

## 2018-05-25 MED ORDER — ONDANSETRON HCL 4 MG/2ML IJ SOLN: 4.0000 mg | Freq: Once | INTRAMUSCULAR | Status: DC | PRN

## 2018-05-25 MED ORDER — ACETAMINOPHEN 500 MG PO TABS
ORAL_TABLET | ORAL | Status: AC
  Filled 2018-05-25: qty 2

## 2018-05-25 SURGICAL SUPPLY — 14 items
CATH ROBINSON RED A/P 16FR (CATHETERS) ×4 IMPLANT
DEVICE MYOSURE LITE (MISCELLANEOUS) ×3 IMPLANT
DEVICE MYOSURE REACH (MISCELLANEOUS) IMPLANT
FILTER ARTHROSCOPY CONVERTOR (FILTER) ×4 IMPLANT
GLOVE BIO SURGEON STRL SZ7 (GLOVE) ×4 IMPLANT
GLOVE BIOGEL PI IND STRL 7.0 (GLOVE) ×2 IMPLANT
GLOVE BIOGEL PI INDICATOR 7.0 (GLOVE) ×2
GOWN STRL REUS W/TWL LRG LVL3 (GOWN DISPOSABLE) ×8 IMPLANT
PACK VAGINAL MINOR WOMEN LF (CUSTOM PROCEDURE TRAY) ×4 IMPLANT
PAD OB MATERNITY 4.3X12.25 (PERSONAL CARE ITEMS) ×4 IMPLANT
SEAL ROD LENS SCOPE MYOSURE (ABLATOR) ×4 IMPLANT
TOWEL OR 17X24 6PK STRL BLUE (TOWEL DISPOSABLE) ×8 IMPLANT
TUBING AQUILEX INFLOW (TUBING) ×4 IMPLANT
TUBING AQUILEX OUTFLOW (TUBING) ×4 IMPLANT

## 2018-05-25 NOTE — H&P (Signed)
  History and physical exam unchanged 

## 2018-05-25 NOTE — Anesthesia Postprocedure Evaluation (Signed)
Anesthesia Post Note  Patient: Margaret [name]  Procedure(s) Performed: DILATATION & CURETTAGE/HYSTEROSCOPY WITH MYOSURE (N/A Vagina )     Patient location during evaluation: PACU Anesthesia Type: General Level of consciousness: awake and alert Pain management: pain level controlled Vital Signs Assessment: post-procedure vital signs reviewed and stable Respiratory status: spontaneous breathing, nonlabored ventilation and respiratory function stable Cardiovascular status: blood pressure returned to baseline and stable Postop Assessment: no apparent nausea or vomiting Anesthetic complications: no    Last Vitals:  Vitals:   05/25/18 0825 05/25/18 0858  BP:  (!) 145/73  Pulse: (!) 50 (!) 50  Resp: 17 16  Temp:  36.6 C  SpO2: 96% 99%    Last Pain:  Vitals:   05/25/18 0815  TempSrc:   PainSc: 2    Pain Goal: Patients Stated Pain Goal: 4 (05/25/18 0815)               Cecile HearingStephen Edward Jozy Mcphearson

## 2018-05-25 NOTE — Op Note (Signed)
NAMHessie [name]: Todt, [name] MEDICAL RECORD [id number] ACCOUNT [id number] DATE OF BIRTH:[birth year] FACILITY: [facility] LOCATION: [facility] PHYSICIAN:Ashiah Karpowicz Lisbeth PlyS. Tanetta Fuhriman, MD  OPERATIVE REPORT  DATE OF PROCEDURE:  05/25/2018  PREOPERATIVE DIAGNOSIS:  Abnormal uterine bleeding on hormone replacement therapy with endometrial thickening.  POSTOPERATIVE DIAGNOSIS:  Abnormal uterine bleeding on hormone replacement therapy with endometrial thickening.  PROCEDURE:  Paracervical block, cervical dilation with hysteroscopy, MyoSure resection along with endometrial curettings.  SURGEON:  Richardean ChimeraJohn Journii Nierman, MD  ANESTHESIA:  General with paracervical block.  ESTIMATED BLOOD LOSS:  Minimal.  TOTAL DEFICIT:  1800 mL.  PACKS AND DRAINS:  None.  INTRAOPERATIVE BLOOD PLACED:  None.  COMPLICATIONS:  None.  INDICATIONS:  Dictated in history and physical.    PROCEDURE:  The patient was taken to the OR and placed in the dorsal supine position after satisfactory level of general anesthesia was obtained.  Perineum and vagina prepped with Betadine and draped in sterile field.  Speculum was placed in the vaginal  vault.  The cervix grasped with single tooth tenaculum.  A paracervical block with 1% Xylocaine was instituted.  Uterus sounded to 9 cm.  Cervix was serially dilated.  Hysteroscope was introduced.  Intrauterine cavity was distended using saline.   Visualization revealed a shaggy thickened endometrium.  We brought in a small MyoSure and completely resected the endometrium.  This was all sent to pathology.  There were no signs of perforation or complications.  The hysteroscope was removed and  endometrial curettings were then obtained.  At this point in time, the patient was taken out of dorsal lithotomy position once alert and extubated and transferred to recovery room in good condition.  Sponge, instrument and needle count was correct by the  circulating nurse.  TN/NUANCE  D:05/25/2018 T:05/25/2018  JOB:[id number]

## 2018-05-25 NOTE — Anesthesia Preprocedure Evaluation (Signed)
Anesthesia Evaluation  Patient identified by MRN, date of birth, ID band Patient awake    Reviewed: Allergy & Precautions, NPO status , Patient's Chart, lab work & pertinent test results  Airway Mallampati: II  TM Distance: >3 FB Neck ROM: Full    Dental  (+) Dental Advisory Given, Lower Dentures, Upper Dentures   Pulmonary former smoker,    Pulmonary exam normal breath sounds clear to auscultation       Cardiovascular hypertension, negative cardio ROS Normal cardiovascular exam Rhythm:Regular Rate:Normal     Neuro/Psych PSYCHIATRIC DISORDERS Anxiety Depression negative neurological ROS     GI/Hepatic negative GI ROS, Neg liver ROS,   Endo/Other  Hypothyroidism Morbid obesity  Renal/GU negative Renal ROS     Musculoskeletal  (+) Arthritis , Osteoarthritis,    Abdominal   Peds  Hematology negative hematology ROS (+)   Anesthesia Other Findings Day of surgery medications reviewed with the patient.  Reproductive/Obstetrics                             Anesthesia Physical Anesthesia Plan  ASA: III  Anesthesia Plan: General   Post-op Pain Management:    Induction: Intravenous  PONV Risk Score and Plan: 3 and Midazolam, Dexamethasone and Ondansetron  Airway Management Planned: LMA  Additional Equipment:   Intra-op Plan:   Post-operative Plan: Extubation in OR  Informed Consent: I have reviewed the patients History and Physical, chart, labs and discussed the procedure including the risks, benefits and alternatives for the proposed anesthesia with the patient or authorized representative who has indicated his/her understanding and acceptance.   Dental advisory given  Plan Discussed with: CRNA  Anesthesia Plan Comments:         Anesthesia Quick Evaluation

## 2018-05-25 NOTE — Anesthesia Procedure Notes (Signed)
Procedure Name: LMA Insertion Date/Time: 05/25/2018 7:26 AM Performed by: Shanon PayorGregory, Joliana Claflin M, CRNA Pre-anesthesia Checklist: Patient identified, Emergency Drugs available, Suction available, Patient being monitored and Timeout performed Patient Re-evaluated:Patient Re-evaluated prior to induction Oxygen Delivery Method: Circle system utilized Preoxygenation: Pre-oxygenation with 100% oxygen Induction Type: IV induction LMA: LMA with gastric port inserted LMA Size: 4.0 Number of attempts: 1 Placement Confirmation: positive ETCO2 and breath sounds checked- equal and bilateral Tube secured with: Tape Dental Injury: Teeth and Oropharynx as per pre-operative assessment

## 2018-05-25 NOTE — Discharge Instructions (Signed)
DISCHARGE INSTRUCTIONS: HYSTEROSCOPY / ENDOMETRIAL ABLATION The following instructions have been prepared to help you care for yourself upon your return home.  May Remove Scop patch on or before Saturday, August 10  May take stool softner while taking narcotic pain medication to prevent constipation.  Drink plenty of water.  Personal hygiene:  Use sanitary pads for vaginal drainage, not tampons.  Shower the day after your procedure.  NO tub baths, pools or Jacuzzis for 2-3 weeks.  Wipe front to back after using the bathroom.  Activity and limitations:  Do NOT drive or operate any equipment for 24 hours. The effects of anesthesia are still present and drowsiness may result.  Do NOT rest in bed all day.  Walking is encouraged.  Walk up and down stairs slowly.  You may resume your normal activity in one to two days or as indicated by your physician.  Sexual activity: NO intercourse for at least 2 weeks after the procedure, or as indicated by your Doctor.  Diet: Eat a light meal as desired this evening. You may resume your usual diet tomorrow.  Return to Work: You may resume your work activities in one to two days or as indicated by Therapist, sportsyour Doctor.  What to expect after your surgery: Expect to have vaginal bleeding/discharge for 2-3 days and spotting for up to 10 days. It is not unusual to have soreness for up to 1-2 weeks. You may have a slight burning sensation when you urinate for the first day. Mild cramps may continue for a couple of days. You may have a regular period in 2-6 weeks.  Call your doctor for any of the following:  Excessive vaginal bleeding or clotting, saturating and changing one pad every hour.  Inability to urinate 6 hours after discharge from hospital.  Pain not relieved by pain medication.  Fever of 100.4 F or greater.  Unusual vaginal discharge or odor.  Post Anesthesia Home Care Instructions  Activity: Get plenty of rest for the remainder  of the day. A responsible individual must stay with you for 24 hours following the procedure.  For the next 24 hours, DO NOT: -Drive a car -Advertising copywriterperate machinery -Drink alcoholic beverages -Take any medication unless instructed by your physician -Make any legal decisions or sign important papers.  Meals: Start with liquid foods such as gelatin or soup. Progress to regular foods as tolerated. Avoid greasy, spicy, heavy foods. If nausea and/or vomiting occur, drink only clear liquids until the nausea and/or vomiting subsides. Call your physician if vomiting continues.  Special Instructions/Symptoms: Your throat may feel dry or sore from the anesthesia or the breathing tube placed in your throat during surgery. If this causes discomfort, gargle with warm salt water. The discomfort should disappear within 24 hours.  If you had a scopolamine patch placed behind your ear for the management of post- operative nausea and/or vomiting:  1. The medication in the patch is effective for 72 hours, after which it should be removed.  Wrap patch in a tissue and discard in the trash. Wash hands thoroughly with soap and water. 2. You may remove the patch earlier than 72 hours if you experience unpleasant side effects which may include dry mouth, dizziness or visual disturbances. 3. Avoid touching the patch. Wash your hands with soap and water after contact with the patch.

## 2018-05-25 NOTE — Op Note (Signed)
NAMHessie [name]: Hoiland, Glayds H. MEDICAL RECORD [id number] ACCOUNT [id number] DATE OF BIRTH:[birth year] [facility]ACILITY[facility] LOCATION: [facility] PHYSICIAN:Vedansh Kerstetter Lisbeth PlyS. Naly Schwanz, MD  OPERATIVE REPORT  DATE OF PROCEDURE:  05/25/2018  POSTOPERATIVE DIAGNOSIS:  Abnormal uterine bleeding on hormone replacement therapy with thickened endometrium.  POSTOPERATIVE DIAGNOSES:  Abnormal uterine bleeding on hormone replacement therapy with thickened endometrium.  PROCEDURE:  Paracervical block.  Hysteroscopy with resection of endometrium and endometrial curettings.  SURGEON:  Richardean ChimeraJohn Maura Braaten, MD  ANESTHESIA:  General with paracervical block.  ESTIMATED BLOOD LOSS:  Minimal.  TOTAL DEFICIT:  1800 mL  PACKS AND DRAINS:  None.  INTRAOPERATIVE BLOOD PLACED:  None.  COMPLICATIONS:  None.  INDICATIONS:  Dictated in the history and physical.  DESCRIPTION OF PROCEDURE:  The patient was taken to the OR and placed in supine position.  She was subsequently placed in the dorsal lithotomy position.  After satisfactory level of general anesthesia was obtained, the perineum and vagina were prepped  out with Betadine and draped in sterile field.  Speculum was placed in the vaginal vault.  Cervix grasped with single tooth tenaculum.  Paracervical block with 1% Xylocaine was instituted.  Uterus sounded to 9 cm.  Cervix was serially dilated.   Hysteroscope was introduced.  Visualization revealed a shaggy thickened endometrium.  We brought in the small MyoSure and resected all the endometrium and sent to pathology.  There were no signs of complications or perforations.  The hysteroscope was  then removed and endometrial curettings were then obtained and sent for pathology.  At this point in time.  She had no active bleeding.  Single tooth tenaculum and speculum were then removed.  The patient was taken out of dorsal lithotomy position once  alert and extubated and transferred to recovery room in good condition.  Sponge, instrument and  needle count were reported as correct by the circulating nurse.  TN/NUANCE  D:05/25/2018 T:05/25/2018 JOB:[id number]

## 2018-05-25 NOTE — Brief Op Note (Signed)
05/25/2018  7:52 AM  PATIENT:  Margaret [name]  57 y.o. female  PRE-OPERATIVE DIAGNOSIS:  thickened endometrium with PMB  POST-OPERATIVE DIAGNOSIS:  thickened endometrium with PMB  PROCEDURE:  Procedure(s): DILATATION & CURETTAGE/HYSTEROSCOPY WITH MYOSURE (N/A)  SURGEON:  Surgeon(s) and Role:    * Richardean ChimeraMcComb, Fara Worthy, MD - Primary  PHYSICIAN ASSISTANT:   ASSISTANTS: none   ANESTHESIA:   general and paracervical block  EBL:  5 mL   BLOOD ADMINISTERED:none  DRAINS: none   LOCAL MEDICATIONS USED:  XYLOCAINE   SPECIMEN:  Source of Specimen:  endometrial resections and curretting  DISPOSITION OF SPECIMEN:  PATHOLOGY  COUNTS:  YES  TOURNIQUET:  * No tourniquets in log *  DICTATION: .Other Dictation: Dictation Number [id number]  PLAN OF CARE: Discharge to home after PACU  PATIENT DISPOSITION:  PACU - hemodynamically stable.   Delay start of Pharmacological VTE agent (>24hrs) due to surgical blood loss or risk of bleeding: not applicable

## 2018-05-25 NOTE — Transfer of Care (Signed)
Immediate Anesthesia Transfer of Care Note  Patient: Margaret [name]  Procedure(s) Performed: DILATATION & CURETTAGE/HYSTEROSCOPY WITH MYOSURE (N/A Vagina )  Patient Location: PACU  Anesthesia Type:General  Level of Consciousness: awake, alert  and oriented  Airway & Oxygen Therapy: Patient Spontanous Breathing and Patient connected to nasal cannula oxygen  Post-op Assessment: Report given to RN and Post -op Vital signs reviewed and stable  Post vital signs: Reviewed and stable  Last Vitals:  Vitals Value Taken Time  BP    Temp    Pulse 57 05/25/2018  7:52 AM  Resp    SpO2 97 % 05/25/2018  7:52 AM  Vitals shown include unvalidated device data.  Last Pain:  Vitals:   05/25/18 0643  TempSrc: Oral  PainSc: 4       Patients Stated Pain Goal: 4 (05/25/18 [id number])  Complications: No apparent anesthesia complications

## 2018-05-26 ENCOUNTER — Encounter (HOSPITAL_COMMUNITY): Payer: Self-pay | Admitting: Obstetrics and Gynecology

## 2018-06-01 ENCOUNTER — Ambulatory Visit: Payer: PPO | Admitting: Podiatry

## 2018-06-21 ENCOUNTER — Other Ambulatory Visit (INDEPENDENT_AMBULATORY_CARE_PROVIDER_SITE_OTHER): Payer: Self-pay | Admitting: Physical Medicine and Rehabilitation

## 2018-06-21 NOTE — Telephone Encounter (Signed)
Please advise 

## 2018-06-29 ENCOUNTER — Ambulatory Visit: Payer: PPO | Admitting: Podiatry

## 2018-07-20 ENCOUNTER — Other Ambulatory Visit (INDEPENDENT_AMBULATORY_CARE_PROVIDER_SITE_OTHER): Payer: Self-pay | Admitting: Physical Medicine and Rehabilitation

## 2018-07-21 NOTE — Telephone Encounter (Signed)
Please advise 

## 2018-07-21 NOTE — Telephone Encounter (Signed)
Will refill but will need OV before next time

## 2018-08-19 ENCOUNTER — Other Ambulatory Visit (INDEPENDENT_AMBULATORY_CARE_PROVIDER_SITE_OTHER): Payer: Self-pay | Admitting: Physical Medicine and Rehabilitation

## 2018-08-19 NOTE — Telephone Encounter (Signed)
Need to f/up 4 weeks, refilled

## 2018-08-19 NOTE — Telephone Encounter (Signed)
Per your note on last refill request, patient is scheduled for an OV 11/13.

## 2018-08-31 ENCOUNTER — Ambulatory Visit (INDEPENDENT_AMBULATORY_CARE_PROVIDER_SITE_OTHER): Payer: PPO | Admitting: Physical Medicine and Rehabilitation

## 2018-09-27 ENCOUNTER — Ambulatory Visit (INDEPENDENT_AMBULATORY_CARE_PROVIDER_SITE_OTHER): Payer: PPO | Admitting: Physical Medicine and Rehabilitation

## 2018-09-27 ENCOUNTER — Encounter (INDEPENDENT_AMBULATORY_CARE_PROVIDER_SITE_OTHER): Payer: Self-pay | Admitting: Physical Medicine and Rehabilitation

## 2018-09-27 VITALS — BP 151/65 | HR 55 | Ht 66.0 in | Wt 290.0 lb

## 2018-09-27 DIAGNOSIS — M545 Low back pain: Secondary | ICD-10-CM

## 2018-09-27 DIAGNOSIS — M47816 Spondylosis without myelopathy or radiculopathy, lumbar region: Secondary | ICD-10-CM

## 2018-09-27 DIAGNOSIS — F1190 Opioid use, unspecified, uncomplicated: Secondary | ICD-10-CM

## 2018-09-27 DIAGNOSIS — G8929 Other chronic pain: Secondary | ICD-10-CM

## 2018-09-27 NOTE — Progress Notes (Signed)
 .  Numeric Pain Rating Scale and Functional Assessment Average Pain 6 Pain Right Now 4 My pain is constant, dull and aching Pain is worse with: walking, bending, sitting and standing Pain improves with: heat/ice and medication   In the last MONTH (on 0-10 scale) has pain interfered with the following?  1. General activity like being  able to carry out your everyday physical activities such as walking, climbing stairs, carrying groceries, or moving a chair?  Rating(5)  2. Relation with others like being able to carry out your usual social activities and roles such as  activities at home, at work and in your community. Rating(5)  3. Enjoyment of life such that you have  been bothered by emotional problems such as feeling anxious, depressed or irritable?  Rating(2)

## 2018-09-28 ENCOUNTER — Other Ambulatory Visit (INDEPENDENT_AMBULATORY_CARE_PROVIDER_SITE_OTHER): Payer: Self-pay | Admitting: Physical Medicine and Rehabilitation

## 2018-09-29 NOTE — Telephone Encounter (Signed)
Please advise 

## 2018-10-28 ENCOUNTER — Other Ambulatory Visit (INDEPENDENT_AMBULATORY_CARE_PROVIDER_SITE_OTHER): Payer: Self-pay | Admitting: Physical Medicine and Rehabilitation

## 2018-11-02 NOTE — Telephone Encounter (Signed)
Please advise 

## 2018-11-15 DIAGNOSIS — N858 Other specified noninflammatory disorders of uterus: Secondary | ICD-10-CM | POA: Diagnosis not present

## 2018-11-15 DIAGNOSIS — N950 Postmenopausal bleeding: Secondary | ICD-10-CM | POA: Diagnosis not present

## 2018-11-23 DIAGNOSIS — R7309 Other abnormal glucose: Secondary | ICD-10-CM | POA: Diagnosis not present

## 2018-11-23 DIAGNOSIS — E785 Hyperlipidemia, unspecified: Secondary | ICD-10-CM | POA: Diagnosis not present

## 2018-11-23 DIAGNOSIS — I10 Essential (primary) hypertension: Secondary | ICD-10-CM | POA: Diagnosis not present

## 2018-11-23 DIAGNOSIS — E039 Hypothyroidism, unspecified: Secondary | ICD-10-CM | POA: Diagnosis not present

## 2018-11-29 ENCOUNTER — Other Ambulatory Visit (INDEPENDENT_AMBULATORY_CARE_PROVIDER_SITE_OTHER): Payer: Self-pay | Admitting: Physical Medicine and Rehabilitation

## 2018-11-29 NOTE — Telephone Encounter (Signed)
Will refill but needs OV prior to next refill 2 to 4 weeks

## 2018-11-29 NOTE — Telephone Encounter (Signed)
Please advise 

## 2018-12-16 ENCOUNTER — Telehealth (INDEPENDENT_AMBULATORY_CARE_PROVIDER_SITE_OTHER): Payer: Self-pay | Admitting: Physical Medicine and Rehabilitation

## 2018-12-19 NOTE — Telephone Encounter (Signed)
Needs auth for repeat RFA.

## 2018-12-19 NOTE — Telephone Encounter (Signed)
Called [facility] and spoke with Shauntel C and she states no PA is needed for [id number] and [phone].   Pt is scheduled 01/10/2019 and 01/24/2019 with driver.

## 2018-12-19 NOTE — Telephone Encounter (Signed)
Could repeat RFA and I will refill medication, send me reminder back

## 2018-12-23 ENCOUNTER — Other Ambulatory Visit (INDEPENDENT_AMBULATORY_CARE_PROVIDER_SITE_OTHER): Payer: Self-pay | Admitting: Physical Medicine and Rehabilitation

## 2018-12-23 NOTE — Telephone Encounter (Signed)
Please advise. Scheduled for RFA 3/24.

## 2019-01-10 ENCOUNTER — Encounter (INDEPENDENT_AMBULATORY_CARE_PROVIDER_SITE_OTHER): Payer: Self-pay | Admitting: Physical Medicine and Rehabilitation

## 2019-01-13 ENCOUNTER — Encounter (INDEPENDENT_AMBULATORY_CARE_PROVIDER_SITE_OTHER): Payer: Self-pay | Admitting: Physical Medicine and Rehabilitation

## 2019-01-13 NOTE — Progress Notes (Signed)
Margaret DienerDeborah H [name] - 58 y.o. female MRN [id number]  Date of birth: 08/12/1961  Office Visit Note: Visit Date: 09/27/2018 PCP: Dorthula MatasJudd, Donna J, PA-C Referred by: Dorthula MatasJudd, Donna J, PA-C  Subjective: Chief Complaint  Patient presents with  . Lower Back - Pain   HPI: Margaret [name] is a 58 y.o. female who comes in today For reevaluation management of chronic low back pain with some pain referral to the side and lateral hips.  No pain down the legs no radicular complaints.  She has MRI evidence of lumbar facet arthropathy and spondylosis without disc herniation or focal stenosis.  She unfortunately is morbidly obese weighing over 290 pounds and we talked about activity modification with her and weight loss.  She has had no trauma or focal weakness or any other changes.  She has had radiofrequency ablation performed in July that did quite well although it did not alleviate all of her pain.  She does use tramadol daily and uses these appropriately.  Did review database on controlled substances today.  Otherwise things are uncomplicated with her discontinued chronic pain and chronic low back pain.  She has had no foot drop or bowel or bladder changes etc.  No prior history of lumbar surgery.  Review of Systems  Constitutional: Negative for chills, fever, malaise/fatigue and weight loss.  HENT: Negative for hearing loss and sinus pain.   Eyes: Negative for blurred vision, double vision and photophobia.  Respiratory: Negative for cough and shortness of breath.   Cardiovascular: Negative for chest pain, palpitations and leg swelling.  Gastrointestinal: Negative for abdominal pain, nausea and vomiting.  Genitourinary: Negative for flank pain.  Musculoskeletal: Positive for back pain. Negative for myalgias.  Skin: Negative for itching and rash.  Neurological: Negative for tremors, focal weakness and weakness.  Endo/Heme/Allergies: Negative.   Psychiatric/Behavioral: Negative for depression.  All other  systems reviewed and are negative.  Otherwise per HPI.  Assessment & Plan: Visit Diagnoses:  1. Spondylosis without myelopathy or radiculopathy, lumbar region   2. Chronic right-sided low back pain without sciatica   3. Obesity, Class III, BMI 40-49.9 (morbid obesity) (HCC)   4. Opioid use, unspecified, uncomplicated     Plan: Findings:  Chronic mechanical low back pain facet mediated low back pain which is improved with intermittent radiofrequency ablation of the facet joints as well as continued use of tramadol on a consistent basis.  We have reviewed her controlled substance database and she has no aberrant use.  We see her on a frequent basis every few months to review medications.  She is using these appropriately.  He provides some relief and increased activity level and functional level.  Last procedure performed in July did seem to help although the pain does not completely go away it does give her some relief.  Right now we will continue the tramadol and the future would look at repeating the ablation should she need it.    Meds & Orders: No orders of the defined types were placed in this encounter.  No orders of the defined types were placed in this encounter.   Follow-up: Return if symptoms worsen or fail to improve.   Procedures: No procedures performed  No notes on file   Clinical History: MRI LUMBAR SPINE WITHOUT CONTRAST  TECHNIQUE: Multiplanar, multisequence MR imaging of the lumbar spine was performed. No intravenous contrast was administered.  COMPARISON:  01/31/2014  FINDINGS: Segmentation:  Standard.  Alignment:  Physiologic.  Vertebrae:  No fracture, evidence  of discitis, or bone lesion.  Conus medullaris: Extends to the T12 level and appears normal.  Paraspinal and other soft tissues: No paraspinal abnormality.  Disc levels:  Disc spaces: Degenerative disc disease disc height loss at T11-12, L1-2, L3-4, L4-5 and L5-S1.  T11-12: Small  central disc protrusion. No foraminal or central canal stenosis.  T12-L1: No significant disc bulge. No evidence of neural foraminal stenosis. No central canal stenosis.  L1-L2: Broad-based disc bulge flattening the ventral thecal sac. Mild bilateral facet arthropathy. No evidence of neural foraminal stenosis. No central canal stenosis.  L2-L3: No significant disc bulge. No evidence of neural foraminal stenosis. No central canal stenosis.  L3-L4: Broad-based disc bulge flattening the ventral thecal sac and eccentric towards the left. Mild bilateral facet arthropathy. Mild left foraminal stenosis. No right foraminal stenosis. No central canal stenosis.  L4-L5: Mild broad-based disc bulge with a small left foraminal disc protrusion. Moderate bilateral facet arthropathy. Mild right and moderate left foraminal stenosis. Bilateral lateral recess narrowing. No central canal stenosis.  L5-S1: Mild broad-based disc bulge. Moderate bilateral facet arthropathy. Moderate bilateral foraminal narrowing. No central canal stenosis.  IMPRESSION: 1. Diffuse lumbar spine spondylosis as described above. 2. At L4-5 there is a mild broad-based disc bulge with a small left foraminal disc protrusion. Moderate bilateral facet arthropathy. Mild right and moderate left foraminal stenosis. Bilateral lateral recess narrowing.   Electronically Signed   By: Elige Ko   On: 03/17/2017 14:58   She reports that she quit smoking about 10 years ago. Her smoking use included cigarettes. She has a 25.00 pack-year smoking history. She has never used smokeless tobacco. No results for input(s): HGBA1C, LABURIC in the last 8760 hours.  Objective:  VS:  HT:5\' 6"  (167.6 cm)   WT:290 lb (131.5 kg)  BMI:46.83    BP:(!) 151/65  HR:(!) 55bpm  TEMP: ( )  RESP:97 % Physical Exam Vitals signs and nursing note reviewed.  Constitutional:      General: She is not in acute distress.    Appearance: Normal  appearance. She is well-developed. She is obese.  HENT:     Head: Normocephalic and atraumatic.     Nose: Nose normal.     Mouth/Throat:     Mouth: Mucous membranes are moist.     Pharynx: Oropharynx is clear.  Eyes:     Conjunctiva/sclera: Conjunctivae normal.     Pupils: Pupils are equal, round, and reactive to light.  Neck:     Musculoskeletal: Normal range of motion and neck supple.  Cardiovascular:     Rate and Rhythm: Regular rhythm.  Pulmonary:     Effort: Pulmonary effort is normal. No respiratory distress.  Abdominal:     General: There is no distension.     Palpations: Abdomen is soft.     Tenderness: There is no guarding.  Musculoskeletal:     Right lower leg: No edema.     Left lower leg: No edema.     Comments: Patient ambulates without a good distal strength.  She has had concordant low back pain with extension and facet loading although is not as acutely bad as is been in the past.  She has no pain with hip rotation she has good distal strength.  She has no clonus bilaterally.  Mild pain over the greater trochanters.  Skin:    General: Skin is warm and dry.     Findings: No erythema or rash.  Neurological:     General: No focal deficit  present.     Mental Status: She is alert and oriented to person, place, and time.     Motor: No abnormal muscle tone.     Coordination: Coordination normal.     Gait: Gait normal.  Psychiatric:        Mood and Affect: Mood normal.        Behavior: Behavior normal.        Thought Content: Thought content normal.     Ortho Exam Imaging: No results found.  Past Medical/Family/Surgical/Social History: Medications & Allergies reviewed per EMR, new medications updated. Patient Active Problem List   Diagnosis Date Noted  . Primary osteoarthritis of both hands 06/14/2017  . Status post total knee replacement, bilateral 05/17/2017  . DDD (degenerative disc disease), lumbar 05/17/2017  . Backache 11/22/2013  . Leukocytosis  03/25/2013  . Abnormal electrocardiogram 03/25/2013  . Failed total right knee replacement (HCC) 11/10/2012  . Obesity, Class III, BMI 40-49.9 (morbid obesity) (HCC) 11/10/2012  . Endometrial polyp 09/23/2012    Class: Present on Admission  . Hot flushes, perimenopausal 08/03/2012  . Menorrhagia 07/21/2012  . Menopause 07/13/2012  . Other and unspecified hyperlipidemia 07/13/2012  . Bereavement 07/13/2012  . Essential hypertension, benign 07/13/2012  . History of migraine 07/13/2012  . Hypothyroidism 07/13/2012   Past Medical History:  Diagnosis Date  . Anxiety   . Arthritis    knees, lower back, hands  . Depression   . Hyperlipidemia   . Hypertension    on meds, reviewed by Dr. Jacinto Halim for cardiac clearance - 09/2013, told that she was cleared & no need to follow up with him   . Hypothyroidism   . Mental disorder    perimenopause use of Paxil  . SVD (spontaneous vaginal delivery)    x 3  . Thyroid disease   . Total knee replacement status   . Wears dentures    upper and lower   Family History  Problem Relation Age of Onset  . Hyperlipidemia Mother   . Hypertension Mother   . Heart disease Mother   . Kidney disease Mother   . Rheumatologic disease Mother   . Heart disease Father   . Cancer Father   . Heart disease Sister   . Hypertension Sister    Past Surgical History:  Procedure Laterality Date  . CARPAL TUNNEL RELEASE     2000, 2001, both hands  . DILATATION & CURETTAGE/HYSTEROSCOPY WITH MYOSURE N/A 05/25/2018   Procedure: DILATATION & CURETTAGE/HYSTEROSCOPY WITH MYOSURE;  Surgeon: Richardean Chimera, MD;  Location: [facility];  Service: Gynecology;  Laterality: N/A;  . DILATATION & CURRETTAGE/HYSTEROSCOPY WITH RESECTOCOPE  09/23/2012   Procedure: DILATATION & CURETTAGE/HYSTEROSCOPY WITH RESECTOCOPE;  Surgeon: Juluis Mire, MD;  Location: [facility];  Service: Gynecology;  Laterality: N/A;  . HAND SURGERY Bilateral    carpel tunnel surgery- bilateral  . JOINT REPLACEMENT   2000, 2012,   knee replacement bilateral left-2000 & 2012, right-2002  . MULTIPLE TOOTH EXTRACTIONS     upper and lower dentures  . TONSILLECTOMY    . TOTAL KNEE REVISION  11/08/2012   Procedure: TOTAL KNEE REVISION;  Surgeon: Valeria Batman, MD;  Location: [facility];  Service: Orthopedics;  Laterality: Right;  Revision Right Total Knee Replacement   . TUBAL LIGATION    . VAGINAL BIRTH AFTER CESAREAN SECTION     83, 85,87 - all vaginal - no c/s per patient  . VAGINAL DELIVERY  83, 85, 87   x 3  Social History   Occupational History  . Not on file  Tobacco Use  . Smoking status: Former Smoker    Packs/day: 1.00    Years: 25.00    Pack years: 25.00    Types: Cigarettes    Last attempt to quit: 07/13/2008    Years since quitting: 10.5  . Smokeless tobacco: Never Used  Substance and Sexual Activity  . Alcohol use: No  . Drug use: No  . Sexual activity: Yes    Birth control/protection: Post-menopausal

## 2019-01-17 ENCOUNTER — Encounter (INDEPENDENT_AMBULATORY_CARE_PROVIDER_SITE_OTHER): Payer: Self-pay | Admitting: Physical Medicine and Rehabilitation

## 2019-01-19 ENCOUNTER — Other Ambulatory Visit (INDEPENDENT_AMBULATORY_CARE_PROVIDER_SITE_OTHER): Payer: Self-pay | Admitting: Physical Medicine and Rehabilitation

## 2019-01-20 NOTE — Telephone Encounter (Signed)
Please advise 

## 2019-01-24 ENCOUNTER — Encounter (INDEPENDENT_AMBULATORY_CARE_PROVIDER_SITE_OTHER): Payer: Self-pay | Admitting: Physical Medicine and Rehabilitation

## 2019-02-01 ENCOUNTER — Encounter (INDEPENDENT_AMBULATORY_CARE_PROVIDER_SITE_OTHER): Payer: Self-pay | Admitting: Physical Medicine and Rehabilitation

## 2019-02-14 ENCOUNTER — Other Ambulatory Visit (INDEPENDENT_AMBULATORY_CARE_PROVIDER_SITE_OTHER): Payer: Self-pay | Admitting: Physical Medicine and Rehabilitation

## 2019-02-14 ENCOUNTER — Encounter (INDEPENDENT_AMBULATORY_CARE_PROVIDER_SITE_OTHER): Payer: Self-pay | Admitting: Physical Medicine and Rehabilitation

## 2019-02-14 NOTE — Telephone Encounter (Signed)
Please advise 

## 2019-02-22 ENCOUNTER — Encounter (INDEPENDENT_AMBULATORY_CARE_PROVIDER_SITE_OTHER): Payer: Self-pay | Admitting: Physical Medicine and Rehabilitation

## 2019-03-06 ENCOUNTER — Telehealth: Payer: Self-pay | Admitting: *Deleted

## 2019-03-06 NOTE — Telephone Encounter (Signed)
Pt states husband is in the hospital and she would like to cancel appt.

## 2019-03-07 ENCOUNTER — Encounter: Payer: Self-pay | Admitting: Physical Medicine and Rehabilitation

## 2019-03-13 ENCOUNTER — Other Ambulatory Visit (INDEPENDENT_AMBULATORY_CARE_PROVIDER_SITE_OTHER): Payer: Self-pay | Admitting: Physical Medicine and Rehabilitation

## 2019-03-14 NOTE — Telephone Encounter (Signed)
Please advise 

## 2019-04-03 ENCOUNTER — Other Ambulatory Visit (INDEPENDENT_AMBULATORY_CARE_PROVIDER_SITE_OTHER): Payer: Self-pay | Admitting: Physical Medicine and Rehabilitation

## 2019-04-05 ENCOUNTER — Encounter: Payer: PPO | Admitting: Physical Medicine and Rehabilitation

## 2019-04-10 NOTE — Telephone Encounter (Signed)
Please advise 

## 2019-05-02 ENCOUNTER — Telehealth: Payer: Self-pay | Admitting: Physical Medicine and Rehabilitation

## 2019-05-02 ENCOUNTER — Other Ambulatory Visit: Payer: Self-pay | Admitting: Physical Medicine and Rehabilitation

## 2019-05-02 DIAGNOSIS — F411 Generalized anxiety disorder: Secondary | ICD-10-CM

## 2019-05-02 MED ORDER — DIAZEPAM 5 MG PO TABS: ORAL_TABLET | ORAL | 0 refills | Status: DC

## 2019-05-02 NOTE — Progress Notes (Signed)
Pre-procedure diazepam ordered for pre-operative anxiety.  

## 2019-05-02 NOTE — Telephone Encounter (Signed)
Done

## 2019-05-03 ENCOUNTER — Other Ambulatory Visit (INDEPENDENT_AMBULATORY_CARE_PROVIDER_SITE_OTHER): Payer: Self-pay | Admitting: Physical Medicine and Rehabilitation

## 2019-05-03 NOTE — Telephone Encounter (Signed)
Please advise 

## 2019-05-03 NOTE — Telephone Encounter (Signed)
Patient notified

## 2019-05-04 ENCOUNTER — Ambulatory Visit: Payer: Self-pay

## 2019-05-04 ENCOUNTER — Ambulatory Visit (INDEPENDENT_AMBULATORY_CARE_PROVIDER_SITE_OTHER): Payer: PPO | Admitting: Physical Medicine and Rehabilitation

## 2019-05-04 ENCOUNTER — Encounter: Payer: Self-pay | Admitting: Physical Medicine and Rehabilitation

## 2019-05-04 ENCOUNTER — Other Ambulatory Visit: Payer: Self-pay

## 2019-05-04 VITALS — BP 165/75 | HR 54

## 2019-05-04 DIAGNOSIS — M47816 Spondylosis without myelopathy or radiculopathy, lumbar region: Secondary | ICD-10-CM

## 2019-05-04 MED ORDER — METHYLPREDNISOLONE ACETATE 80 MG/ML IJ SUSP
80.0000 mg | Freq: Once | INTRAMUSCULAR | Status: AC
  Administered 2019-05-04: 80 mg

## 2019-05-04 NOTE — Progress Notes (Signed)
.  Numeric Pain Rating Scale and Functional Assessment Average Pain 6   In the last MONTH (on 0-10 scale) has pain interfered with the following?  1. General activity like being  able to carry out your everyday physical activities such as walking, climbing stairs, carrying groceries, or moving a chair?  Rating(5)   +Driver, -BT, -Dye Allergies.  

## 2019-05-08 NOTE — Procedures (Signed)
Lumbar Facet Joint Nerve Denervation  Patient: Margaret [name]      Date of Birth: [birth year] MRN: [id number] PCP: Jamesetta Orleans, PA-C      Visit Date: 05/04/2019   Universal Protocol:    Date/Time: 07/20/206:22 AM  Consent Given By: the patient  Position: PRONE  Additional Comments: Vital signs were monitored before and after the procedure. Patient was prepped and draped in the usual sterile fashion. The correct patient, procedure, and site was verified.   Injection Procedure Details:  Procedure Site One Meds Administered:  Meds ordered this encounter  Medications  . methylPREDNISolone acetate (DEPO-MEDROL) injection 80 mg     Laterality: Right  Location/Site:  L4-L5 L5-S1  Needle size: 18 G  Needle type: Radiofrequency cannula  Needle Placement: Along juncture of superior articular process and transverse pocess  Findings:  -Comments:  Procedure Details: For each desired target nerve, the corresponding transverse process (sacral ala for the L5 dorsal rami) was identified and the fluoroscope was positioned to square off the endplates of the corresponding vertebral body to achieve a true AP midline view.  The beam was then obliqued 15 to 20 degrees and caudally tilted 15 to 20 degrees to line up a trajectory along the target nerves. The skin over the target of the junction of superior articulating process and transverse process (sacral ala for the L5 dorsal rami) was infiltrated with 21ml of 1% Lidocaine without Epinephrine.  The 18 gauge 9mm active tip outer cannula was advanced in trajectory view to the target.  This procedure was repeated for each target nerve.  Then, for all levels, the outer cannula placement was fine-tuned and the position was then confirmed with bi-planar imaging.    Test stimulation was done both at sensory and motor levels to ensure there was no radicular stimulation. The target tissues were then infiltrated with 1 ml of 1% Lidocaine without  Epinephrine. Subsequently, a percutaneous neurotomy was carried out for 90 seconds at 80 degrees Celsius.  After the completion of the lesion, 1 ml of injectate was delivered. It was then repeated for each facet joint nerve mentioned above. Appropriate radiographs were obtained to verify the probe placement during the neurotomy.   Additional Comments:  The patient tolerated the procedure well Dressing: 2 x 2 sterile gauze and Band-Aid    Post-procedure details: Patient was observed during the procedure. Post-procedure instructions were reviewed.  Patient left the clinic in stable condition.

## 2019-05-08 NOTE — Progress Notes (Signed)
Margaret [name] - 58 y.o. female MRN [id number]  Date of birth: December 19, 1960  Office Visit Note: Visit Date: 05/04/2019 PCP: Jamesetta Orleans, PA-C Referred by: Jamesetta Orleans, PA-C  Subjective: Chief Complaint  Patient presents with  . Lower Back - Pain   HPI:  Margaret [name] is a 58 y.o. female who comes in today For planned right-sided radiofrequency ablation of the L4-5 and L5-S1 facet joints.  This is a repeat ablation.  Last procedure was performed exactly 1 year ago to the date.  She has history of chronic axial low back pain which is been unrelieved with medication management and physical therapy and exercise and activity modification.  She has no stenosis or radicular leg pain.  Her case is complicated by morbid obesity and now she has had the recent death of her husband which is really weighing on her.  His death was unexpected and was from a pulmonary embolus.  She reports right worse than left low back pain.  She does use ibuprofen and tramadol.  Her average pain is 6 out of 10.  ROS Otherwise per HPI.  Assessment & Plan: Visit Diagnoses:  1. Spondylosis without myelopathy or radiculopathy, lumbar region     Plan: No additional findings.   Meds & Orders:  Meds ordered this encounter  Medications  . methylPREDNISolone acetate (DEPO-MEDROL) injection 80 mg    Orders Placed This Encounter  Procedures  . Radiofrequency,Lumbar  . XR C-ARM NO REPORT    Follow-up: No follow-ups on file.   Procedures: No procedures performed  Lumbar Facet Joint Nerve Denervation  Patient: Margaret [name]      Date of Birth: [birth year] MRN: [id number] PCP: Jamesetta Orleans, PA-C      Visit Date: 05/04/2019   Universal Protocol:    Date/Time: 07/20/206:22 AM  Consent Given By: the patient  Position: PRONE  Additional Comments: Vital signs were monitored before and after the procedure. Patient was prepped and draped in the usual sterile fashion. The correct patient, procedure, and site  was verified.   Injection Procedure Details:  Procedure Site One Meds Administered:  Meds ordered this encounter  Medications  . methylPREDNISolone acetate (DEPO-MEDROL) injection 80 mg     Laterality: Right  Location/Site:  L4-L5 L5-S1  Needle size: 18 G  Needle type: Radiofrequency cannula  Needle Placement: Along juncture of superior articular process and transverse pocess  Findings:  -Comments:  Procedure Details: For each desired target nerve, the corresponding transverse process (sacral ala for the L5 dorsal rami) was identified and the fluoroscope was positioned to square off the endplates of the corresponding vertebral body to achieve a true AP midline view.  The beam was then obliqued 15 to 20 degrees and caudally tilted 15 to 20 degrees to line up a trajectory along the target nerves. The skin over the target of the junction of superior articulating process and transverse process (sacral ala for the L5 dorsal rami) was infiltrated with 52ml of 1% Lidocaine without Epinephrine.  The 18 gauge 22mm active tip outer cannula was advanced in trajectory view to the target.  This procedure was repeated for each target nerve.  Then, for all levels, the outer cannula placement was fine-tuned and the position was then confirmed with bi-planar imaging.    Test stimulation was done both at sensory and motor levels to ensure there was no radicular stimulation. The target tissues were then infiltrated with 1 ml of 1% Lidocaine without Epinephrine. Subsequently, a percutaneous  neurotomy was carried out for 90 seconds at 80 degrees Celsius.  After the completion of the lesion, 1 ml of injectate was delivered. It was then repeated for each facet joint nerve mentioned above. Appropriate radiographs were obtained to verify the probe placement during the neurotomy.   Additional Comments:  The patient tolerated the procedure well Dressing: 2 x 2 sterile gauze and Band-Aid    Post-procedure  details: Patient was observed during the procedure. Post-procedure instructions were reviewed.  Patient left the clinic in stable condition.      Clinical History: MRI LUMBAR SPINE WITHOUT CONTRAST  TECHNIQUE: Multiplanar, multisequence MR imaging of the lumbar spine was performed. No intravenous contrast was administered.  COMPARISON:  01/31/2014  FINDINGS: Segmentation:  Standard.  Alignment:  Physiologic.  Vertebrae:  No fracture, evidence of discitis, or bone lesion.  Conus medullaris: Extends to the T12 level and appears normal.  Paraspinal and other soft tissues: No paraspinal abnormality.  Disc levels:  Disc spaces: Degenerative disc disease disc height loss at T11-12, L1-2, L3-4, L4-5 and L5-S1.  T11-12: Small central disc protrusion. No foraminal or central canal stenosis.  T12-L1: No significant disc bulge. No evidence of neural foraminal stenosis. No central canal stenosis.  L1-L2: Broad-based disc bulge flattening the ventral thecal sac. Mild bilateral facet arthropathy. No evidence of neural foraminal stenosis. No central canal stenosis.  L2-L3: No significant disc bulge. No evidence of neural foraminal stenosis. No central canal stenosis.  L3-L4: Broad-based disc bulge flattening the ventral thecal sac and eccentric towards the left. Mild bilateral facet arthropathy. Mild left foraminal stenosis. No right foraminal stenosis. No central canal stenosis.  L4-L5: Mild broad-based disc bulge with a small left foraminal disc protrusion. Moderate bilateral facet arthropathy. Mild right and moderate left foraminal stenosis. Bilateral lateral recess narrowing. No central canal stenosis.  L5-S1: Mild broad-based disc bulge. Moderate bilateral facet arthropathy. Moderate bilateral foraminal narrowing. No central canal stenosis.  IMPRESSION: 1. Diffuse lumbar spine spondylosis as described above. 2. At L4-5 there is a mild broad-based  disc bulge with a small left foraminal disc protrusion. Moderate bilateral facet arthropathy. Mild right and moderate left foraminal stenosis. Bilateral lateral recess narrowing.   Electronically Signed   By: Elige KoHetal  Patel   On: 03/17/2017 14:58     Objective:  VS:  HT:    WT:   BMI:     BP:(!) 165/75  HR:(!) 54bpm  TEMP: ( )  RESP:  Physical Exam  Ortho Exam Imaging: No results found.

## 2019-05-11 ENCOUNTER — Ambulatory Visit (INDEPENDENT_AMBULATORY_CARE_PROVIDER_SITE_OTHER): Payer: PPO | Admitting: Physical Medicine and Rehabilitation

## 2019-05-11 ENCOUNTER — Encounter: Payer: Self-pay | Admitting: Physical Medicine and Rehabilitation

## 2019-05-11 ENCOUNTER — Other Ambulatory Visit: Payer: Self-pay

## 2019-05-11 ENCOUNTER — Ambulatory Visit: Payer: Self-pay

## 2019-05-11 VITALS — BP 173/82 | HR 53

## 2019-05-11 DIAGNOSIS — M47816 Spondylosis without myelopathy or radiculopathy, lumbar region: Secondary | ICD-10-CM | POA: Diagnosis not present

## 2019-05-11 MED ORDER — METHYLPREDNISOLONE ACETATE 80 MG/ML IJ SUSP
80.0000 mg | Freq: Once | INTRAMUSCULAR | Status: AC
  Administered 2019-05-11: 80 mg

## 2019-05-11 NOTE — Progress Notes (Signed)
 .  Numeric Pain Rating Scale and Functional Assessment Average Pain 5   In the last MONTH (on 0-10 scale) has pain interfered with the following?  1. General activity like being  able to carry out your everyday physical activities such as walking, climbing stairs, carrying groceries, or moving a chair?  Rating(4)   +Driver, -BT, -Dye Allergies.  

## 2019-05-12 NOTE — Progress Notes (Signed)
NOELA BROTHERS - 58 y.o. female MRN [id number]  Date of birth: 1961/02/25  Office Visit Note: Visit Date: 05/11/2019 PCP: Jamesetta Orleans, PA-C Referred by: Jamesetta Orleans, PA-C  Subjective: Chief Complaint  Patient presents with  . Lower Back - Pain   HPI:  Margaret [name] is a 58 y.o. female who comes in today For planned left-sided radiofrequency ablation of the L4-5 and L5-S1 facet joints for axial low back pain no radicular pain left-sided pain predominant.  Prior right sided ablation seems to diminish the pain is we are going to the left side.  This is a repeat radiofrequency ablation she has had this in the past.  Please review our prior notes for further details and justification.  ROS Otherwise per HPI.  Assessment & Plan: Visit Diagnoses:  1. Spondylosis without myelopathy or radiculopathy, lumbar region     Plan: No additional findings.   Meds & Orders:  Meds ordered this encounter  Medications  . methylPREDNISolone acetate (DEPO-MEDROL) injection 80 mg    Orders Placed This Encounter  Procedures  . Radiofrequency,Lumbar  . XR C-ARM NO REPORT    Follow-up: Return if symptoms worsen or fail to improve.   Procedures: No procedures performed  Lumbar Facet Joint Nerve Denervation  Patient: Margaret [name]      Date of Birth: 09-20-1961 MRN: [id number] PCP: Jamesetta Orleans, PA-C      Visit Date: 05/11/2019   Universal Protocol:    Date/Time: 07/24/205:37 AM  Consent Given By: the patient  Position: PRONE  Additional Comments: Vital signs were monitored before and after the procedure. Patient was prepped and draped in the usual sterile fashion. The correct patient, procedure, and site was verified.   Injection Procedure Details:  Procedure Site One Meds Administered:  Meds ordered this encounter  Medications  . methylPREDNISolone acetate (DEPO-MEDROL) injection 80 mg     Laterality: Left  Location/Site:  L4-L5 L5-S1  Needle size: 18 G  Needle  type: Radiofrequency cannula  Needle Placement: Along juncture of superior articular process and transverse pocess  Findings:  -Comments:  Procedure Details: For each desired target nerve, the corresponding transverse process (sacral ala for the L5 dorsal rami) was identified and the fluoroscope was positioned to square off the endplates of the corresponding vertebral body to achieve a true AP midline view.  The beam was then obliqued 15 to 20 degrees and caudally tilted 15 to 20 degrees to line up a trajectory along the target nerves. The skin over the target of the junction of superior articulating process and transverse process (sacral ala for the L5 dorsal rami) was infiltrated with 12ml of 1% Lidocaine without Epinephrine.  The 18 gauge 49mm active tip outer cannula was advanced in trajectory view to the target.  This procedure was repeated for each target nerve.  Then, for all levels, the outer cannula placement was fine-tuned and the position was then confirmed with bi-planar imaging.    Test stimulation was done both at sensory and motor levels to ensure there was no radicular stimulation. The target tissues were then infiltrated with 1 ml of 1% Lidocaine without Epinephrine. Subsequently, a percutaneous neurotomy was carried out for 90 seconds at 80 degrees Celsius.  After the completion of the lesion, 1 ml of injectate was delivered. It was then repeated for each facet joint nerve mentioned above. Appropriate radiographs were obtained to verify the probe placement during the neurotomy.   Additional Comments:  The patient tolerated the  procedure well Dressing: 2 x 2 sterile gauze and Band-Aid    Post-procedure details: Patient was observed during the procedure. Post-procedure instructions were reviewed.  Patient left the clinic in stable condition.      Clinical History: MRI LUMBAR SPINE WITHOUT CONTRAST  TECHNIQUE: Multiplanar, multisequence MR imaging of the lumbar spine  was performed. No intravenous contrast was administered.  COMPARISON:  01/31/2014  FINDINGS: Segmentation:  Standard.  Alignment:  Physiologic.  Vertebrae:  No fracture, evidence of discitis, or bone lesion.  Conus medullaris: Extends to the T12 level and appears normal.  Paraspinal and other soft tissues: No paraspinal abnormality.  Disc levels:  Disc spaces: Degenerative disc disease disc height loss at T11-12, L1-2, L3-4, L4-5 and L5-S1.  T11-12: Small central disc protrusion. No foraminal or central canal stenosis.  T12-L1: No significant disc bulge. No evidence of neural foraminal stenosis. No central canal stenosis.  L1-L2: Broad-based disc bulge flattening the ventral thecal sac. Mild bilateral facet arthropathy. No evidence of neural foraminal stenosis. No central canal stenosis.  L2-L3: No significant disc bulge. No evidence of neural foraminal stenosis. No central canal stenosis.  L3-L4: Broad-based disc bulge flattening the ventral thecal sac and eccentric towards the left. Mild bilateral facet arthropathy. Mild left foraminal stenosis. No right foraminal stenosis. No central canal stenosis.  L4-L5: Mild broad-based disc bulge with a small left foraminal disc protrusion. Moderate bilateral facet arthropathy. Mild right and moderate left foraminal stenosis. Bilateral lateral recess narrowing. No central canal stenosis.  L5-S1: Mild broad-based disc bulge. Moderate bilateral facet arthropathy. Moderate bilateral foraminal narrowing. No central canal stenosis.  IMPRESSION: 1. Diffuse lumbar spine spondylosis as described above. 2. At L4-5 there is a mild broad-based disc bulge with a small left foraminal disc protrusion. Moderate bilateral facet arthropathy. Mild right and moderate left foraminal stenosis. Bilateral lateral recess narrowing.   Electronically Signed   By: Elige KoHetal  Patel   On: 03/17/2017 14:58     Objective:  VS:   HT:    WT:   BMI:     BP:(!) 173/82  HR:(!) 53bpm  TEMP: ( )  RESP:  Physical Exam  Ortho Exam Imaging: Xr C-arm No Report  Result Date: 05/11/2019 Please see Notes tab for imaging impression.

## 2019-05-12 NOTE — Procedures (Signed)
Lumbar Facet Joint Nerve Denervation  Patient: Margaret [name]      Date of Birth: 11/04/1960 MRN: [id number] PCP: Jamesetta Orleans, PA-C      Visit Date: 05/11/2019   Universal Protocol:    Date/Time: 07/24/205:37 AM  Consent Given By: the patient  Position: PRONE  Additional Comments: Vital signs were monitored before and after the procedure. Patient was prepped and draped in the usual sterile fashion. The correct patient, procedure, and site was verified.   Injection Procedure Details:  Procedure Site One Meds Administered:  Meds ordered this encounter  Medications  . methylPREDNISolone acetate (DEPO-MEDROL) injection 80 mg     Laterality: Left  Location/Site:  L4-L5 L5-S1  Needle size: 18 G  Needle type: Radiofrequency cannula  Needle Placement: Along juncture of superior articular process and transverse pocess  Findings:  -Comments:  Procedure Details: For each desired target nerve, the corresponding transverse process (sacral ala for the L5 dorsal rami) was identified and the fluoroscope was positioned to square off the endplates of the corresponding vertebral body to achieve a true AP midline view.  The beam was then obliqued 15 to 20 degrees and caudally tilted 15 to 20 degrees to line up a trajectory along the target nerves. The skin over the target of the junction of superior articulating process and transverse process (sacral ala for the L5 dorsal rami) was infiltrated with 24ml of 1% Lidocaine without Epinephrine.  The 18 gauge 74mm active tip outer cannula was advanced in trajectory view to the target.  This procedure was repeated for each target nerve.  Then, for all levels, the outer cannula placement was fine-tuned and the position was then confirmed with bi-planar imaging.    Test stimulation was done both at sensory and motor levels to ensure there was no radicular stimulation. The target tissues were then infiltrated with 1 ml of 1% Lidocaine without  Epinephrine. Subsequently, a percutaneous neurotomy was carried out for 90 seconds at 80 degrees Celsius.  After the completion of the lesion, 1 ml of injectate was delivered. It was then repeated for each facet joint nerve mentioned above. Appropriate radiographs were obtained to verify the probe placement during the neurotomy.   Additional Comments:  The patient tolerated the procedure well Dressing: 2 x 2 sterile gauze and Band-Aid    Post-procedure details: Patient was observed during the procedure. Post-procedure instructions were reviewed.  Patient left the clinic in stable condition.

## 2019-05-19 ENCOUNTER — Other Ambulatory Visit: Payer: Self-pay | Admitting: Physical Medicine and Rehabilitation

## 2019-05-19 ENCOUNTER — Telehealth: Payer: Self-pay | Admitting: Physical Medicine and Rehabilitation

## 2019-05-19 MED ORDER — TRAMADOL HCL 50 MG PO TABS: 50.0000 mg | ORAL_TABLET | Freq: Two times a day (BID) | ORAL | 0 refills | Status: DC | PRN

## 2019-05-19 NOTE — Telephone Encounter (Signed)
Left message to advise that prescription has been sent.

## 2019-05-19 NOTE — Progress Notes (Signed)
I did refill early as patient going out of town and [place] have recent procedure. Will monitor to make sure not need progreesive dose.

## 2019-05-19 NOTE — Telephone Encounter (Signed)
Please tell her I did send in early refill since travel and recent procedure but will need to monitor so not seeing increased usage

## 2019-05-31 DIAGNOSIS — Z634 Disappearance and death of family member: Secondary | ICD-10-CM | POA: Diagnosis not present

## 2019-05-31 DIAGNOSIS — E039 Hypothyroidism, unspecified: Secondary | ICD-10-CM | POA: Diagnosis not present

## 2019-05-31 DIAGNOSIS — R7309 Other abnormal glucose: Secondary | ICD-10-CM | POA: Diagnosis not present

## 2019-05-31 DIAGNOSIS — Z0000 Encounter for general adult medical examination without abnormal findings: Secondary | ICD-10-CM | POA: Diagnosis not present

## 2019-05-31 DIAGNOSIS — I10 Essential (primary) hypertension: Secondary | ICD-10-CM | POA: Diagnosis not present

## 2019-05-31 DIAGNOSIS — F4321 Adjustment disorder with depressed mood: Secondary | ICD-10-CM | POA: Diagnosis not present

## 2019-05-31 DIAGNOSIS — Z79899 Other long term (current) drug therapy: Secondary | ICD-10-CM | POA: Diagnosis not present

## 2019-05-31 DIAGNOSIS — Z87891 Personal history of nicotine dependence: Secondary | ICD-10-CM | POA: Diagnosis not present

## 2019-05-31 DIAGNOSIS — Z6841 Body Mass Index (BMI) 40.0 and over, adult: Secondary | ICD-10-CM | POA: Diagnosis not present

## 2019-05-31 DIAGNOSIS — E785 Hyperlipidemia, unspecified: Secondary | ICD-10-CM | POA: Diagnosis not present

## 2019-06-12 ENCOUNTER — Telehealth: Payer: Self-pay | Admitting: Physical Medicine and Rehabilitation

## 2019-06-12 ENCOUNTER — Ambulatory Visit: Payer: Self-pay

## 2019-06-12 ENCOUNTER — Encounter: Payer: Self-pay | Admitting: Physical Medicine and Rehabilitation

## 2019-06-12 ENCOUNTER — Ambulatory Visit (INDEPENDENT_AMBULATORY_CARE_PROVIDER_SITE_OTHER): Payer: PPO | Admitting: Physical Medicine and Rehabilitation

## 2019-06-12 ENCOUNTER — Other Ambulatory Visit: Payer: Self-pay | Admitting: Physical Medicine and Rehabilitation

## 2019-06-12 VITALS — BP 176/86 | HR 65 | Ht 66.0 in | Wt 300.0 lb

## 2019-06-12 DIAGNOSIS — F1190 Opioid use, unspecified, uncomplicated: Secondary | ICD-10-CM

## 2019-06-12 DIAGNOSIS — M5416 Radiculopathy, lumbar region: Secondary | ICD-10-CM | POA: Diagnosis not present

## 2019-06-12 DIAGNOSIS — M25552 Pain in left hip: Secondary | ICD-10-CM | POA: Diagnosis not present

## 2019-06-12 DIAGNOSIS — M48061 Spinal stenosis, lumbar region without neurogenic claudication: Secondary | ICD-10-CM

## 2019-06-12 MED ORDER — TRIAMCINOLONE ACETONIDE 40 MG/ML IJ SUSP
60.0000 mg | INTRAMUSCULAR | Status: AC | PRN
  Administered 2019-06-12: 60 mg via INTRA_ARTICULAR

## 2019-06-12 MED ORDER — PREGABALIN 75 MG PO CAPS: ORAL_CAPSULE | ORAL | 0 refills | Status: DC

## 2019-06-12 MED ORDER — BUPIVACAINE HCL 0.25 % IJ SOLN
4.0000 mL | INTRAMUSCULAR | Status: AC | PRN
  Administered 2019-06-12: 4 mL via INTRA_ARTICULAR

## 2019-06-12 MED ORDER — TRAMADOL HCL 50 MG PO TABS: 50.0000 mg | ORAL_TABLET | Freq: Two times a day (BID) | ORAL | 0 refills | Status: DC | PRN

## 2019-06-12 NOTE — Progress Notes (Signed)
 .  Numeric Pain Rating Scale and Functional Assessment Average Pain 8 Pain Right Now 8 My pain is constant, sharp and stabbing Pain is worse with: bending and standing Pain improves with: heat/ice   In the last MONTH (on 0-10 scale) has pain interfered with the following?  1. General activity like being  able to carry out your everyday physical activities such as walking, climbing stairs, carrying groceries, or moving a chair?  Rating(9)  2. Relation with others like being able to carry out your usual social activities and roles such as  activities at home, at work and in your community. Rating(9)  3. Enjoyment of life such that you have  been bothered by emotional problems such as feeling anxious, depressed or irritable?  Rating(5)

## 2019-06-12 NOTE — Progress Notes (Signed)
Margaret [name] - 58 y.o. female MRN [id number]  Date of birth: 10/01/1961  Office Visit Note: Visit Date: 06/12/2019 PCP: Margaret MatasJudd, Donna J, PA-C Referred by: Margaret MatasJudd, Donna J, PA-C  Subjective: Chief Complaint  Patient presents with  . Lower Back - Pain  . Left Hip - Pain  . Left Leg - Pain   HPI:  Margaret [name] is a 58 y.o. female who comes in today For evaluation management of fairly acute onset 3-week duration of left hip and leg pain.  Her pain rating is very severe 8 out of 10 pain.  She reports most of the lower back on the left side with referral into the left outer hip but sometimes anterior proximal hip and thigh.  She reports that over the last week it is progressively gotten worse.  Is worse with bending and standing but today she prefers to stand than sit.  She is been using heat and ice as well as ibuprofen.  I have maintained her on chronic tramadol twice a day for some pain relief of chronic arthritis of the lumbar spine.  She is approximately 4 weeks out from radiofrequency ablation which was really uneventful from a procedural standpoint.  She has MRI from a couple of years ago without much in the way of stenosis or disc problems at least on that MRI.  No history of radicular pain or sciatica in the past.  She has had no prior history of problems with her hips.  She does have bilateral knee replacements.  She is morbidly obese at 300 pounds.  She also unfortunately has had a recent death of her husband and she has had some depression.  She reports no real paresthesias.  She reports no focal weakness but feels like her left hip catches when she walks.  She has had no prior lumbar surgery.  In the past the ablations and helped her low back pain.  Her pain is constant sharp and stabbing worse with bending and standing but she actually prefers standing right now.  Review of Systems  Constitutional: Negative for chills, fever, malaise/fatigue and weight loss.  HENT: Negative for hearing  loss and sinus pain.   Eyes: Negative for blurred vision, double vision and photophobia.  Respiratory: Negative for cough and shortness of breath.   Cardiovascular: Negative for chest pain, palpitations and leg swelling.  Gastrointestinal: Negative for abdominal pain, nausea and vomiting.  Genitourinary: Negative for flank pain.  Musculoskeletal: Positive for back pain and joint pain. Negative for myalgias.  Skin: Negative for itching and rash.  Neurological: Negative for tremors, focal weakness and weakness.  Endo/Heme/Allergies: Negative.   Psychiatric/Behavioral: Negative for depression.  All other systems reviewed and are negative.  Otherwise per HPI.  Assessment & Plan: Visit Diagnoses:  1. Pain in left hip   2. Lumbar radiculopathy   3. Bilateral stenosis of lateral recess of lumbar spine   4. Opioid use, unspecified, uncomplicated     Plan: Findings:  Very severe low back pain with left hip pain both laterally and anterior laterally.  Exam was somewhat difficult but she did have pain with internal rotation and pain with palpation over the bursa.  She has good distal strength without clonus.  Exam was somewhat consistent perhaps with hip pathology.  X-rays of the pelvis and left hip were obtained and there was some periarticular osteophyte but no real significant severe arthritis.  Left hip did look a little worse than the right hip.  We did  complete diagnostic injection today but the patient had no pain relief during the anesthetic phase of the injection.  Rena treat this as a lumbar sprain strain with radicular complaints now that we have completed hip injection.  I feel comfortable ruling out an intra-articular hip problem at this point.  The next step would be to continue ibuprofen as well as Tylenol and refill tramadol.  We talked about activity modification and movement.  She may benefit from the addition of Lyrica.  I want to have her come in for diagnostic epidural injection.  If  that is not beneficial we would update MRI of the lumbar spine.  She does not really want to undertake physical therapy at this point given the severity of the pain.  I was going to start Lyrica but she is on gabapentin at night.  We will make a phone call back to her and see if we can switch this to Lyrica.    Meds & Orders:  Meds ordered this encounter  Medications  . traMADol (ULTRAM) 50 MG tablet    Sig: Take 1 tablet (50 mg total) by mouth every 12 (twelve) hours as needed. for pain    Dispense:  40 tablet    Refill:  0    Not to exceed 4 additional fills before 10/07/2019    Orders Placed This Encounter  Procedures  . Large Joint Inj: L hip joint  . XR HIP UNILAT W OR W/O PELVIS 1V LEFT  . XR C-ARM NO REPORT    Follow-up: Return for L4-5 versus L5-S1 interlaminar epidural steroid.   Procedures: Large Joint Inj: L hip joint on 06/12/2019 11:39 AM Indications: diagnostic evaluation and pain Details: 22 G 3.5 in needle, fluoroscopy-guided anterior approach  Arthrogram: No  Medications: 4 mL bupivacaine 0.25 %; 60 mg triamcinolone acetonide 40 MG/ML Outcome: tolerated well, no immediate complications  There was excellent flow of contrast producing a partial arthrogram of the hip. The patient did NOT have relief of symptoms during the anesthetic phase of the injection. Procedure, treatment alternatives, risks and benefits explained, specific risks discussed. Consent was given by the patient. Immediately prior to procedure a time out was called to verify the correct patient, procedure, equipment, support staff and site/side marked as required. Patient was prepped and draped in the usual sterile fashion.      No notes on file   Clinical History: MRI LUMBAR SPINE WITHOUT CONTRAST  TECHNIQUE: Multiplanar, multisequence MR imaging of the lumbar spine was performed. No intravenous contrast was administered.  COMPARISON:  01/31/2014  FINDINGS: Segmentation:  Standard.   Alignment:  Physiologic.  Vertebrae:  No fracture, evidence of discitis, or bone lesion.  Conus medullaris: Extends to the T12 level and appears normal.  Paraspinal and other soft tissues: No paraspinal abnormality.  Disc levels:  Disc spaces: Degenerative disc disease disc height loss at T11-12, L1-2, L3-4, L4-5 and L5-S1.  T11-12: Small central disc protrusion. No foraminal or central canal stenosis.  T12-L1: No significant disc bulge. No evidence of neural foraminal stenosis. No central canal stenosis.  L1-L2: Broad-based disc bulge flattening the ventral thecal sac. Mild bilateral facet arthropathy. No evidence of neural foraminal stenosis. No central canal stenosis.  L2-L3: No significant disc bulge. No evidence of neural foraminal stenosis. No central canal stenosis.  L3-L4: Broad-based disc bulge flattening the ventral thecal sac and eccentric towards the left. Mild bilateral facet arthropathy. Mild left foraminal stenosis. No right foraminal stenosis. No central canal stenosis.  L4-L5: Mild  broad-based disc bulge with a small left foraminal disc protrusion. Moderate bilateral facet arthropathy. Mild right and moderate left foraminal stenosis. Bilateral lateral recess narrowing. No central canal stenosis.  L5-S1: Mild broad-based disc bulge. Moderate bilateral facet arthropathy. Moderate bilateral foraminal narrowing. No central canal stenosis.  IMPRESSION: 1. Diffuse lumbar spine spondylosis as described above. 2. At L4-5 there is a mild broad-based disc bulge with a small left foraminal disc protrusion. Moderate bilateral facet arthropathy. Mild right and moderate left foraminal stenosis. Bilateral lateral recess narrowing.   Electronically Signed   By: Elige KoHetal  Patel   On: 03/17/2017 14:58     Objective:  VS:  HT:5\' 6"  (167.6 cm)   WT:300 lb (136.1 kg)  BMI:48.44    BP:(!) 176/86  HR:65bpm  TEMP: ( )  RESP:  Physical Exam Vitals  signs and nursing note reviewed.  Constitutional:      General: She is not in acute distress.    Appearance: Normal appearance. She is well-developed. She is obese.     Comments: Somewhat distressed from a pain standpoint  HENT:     Head: Normocephalic and atraumatic.     Nose: Nose normal.     Mouth/Throat:     Mouth: Mucous membranes are moist.     Pharynx: Oropharynx is clear.  Eyes:     Conjunctiva/sclera: Conjunctivae normal.     Pupils: Pupils are equal, round, and reactive to light.  Neck:     Musculoskeletal: Normal range of motion and neck supple.  Cardiovascular:     Rate and Rhythm: Regular rhythm.  Pulmonary:     Effort: Pulmonary effort is normal. No respiratory distress.  Abdominal:     General: There is no distension.     Palpations: Abdomen is soft.     Tenderness: There is no guarding.  Musculoskeletal:     Right lower leg: No edema.     Left lower leg: No edema.     Comments: Patient ambulating with a cane which is new for her.  She ambulates slowly with an antalgic gait to the left with somewhat of a catching sensation on various steps.  Painful range of motion of the left hip compared to the right.  This seems to be somewhat equivocal though with external and internal rotation.  She is tender over the left greater trochanter but not the right.  She has an equivocal slump test.  She has good distal strength without clonus.  Skin:    General: Skin is warm and dry.     Findings: No erythema or rash.  Neurological:     General: No focal deficit present.     Mental Status: She is alert and oriented to person, place, and time.     Sensory: No sensory deficit.     Motor: No weakness or abnormal muscle tone.     Coordination: Coordination normal.     Gait: Gait abnormal.  Psychiatric:        Mood and Affect: Mood normal.        Behavior: Behavior normal.        Thought Content: Thought content normal.     Ortho Exam Imaging: Xr C-arm No Report  Result Date:  06/12/2019 Please see Notes tab for imaging impression.

## 2019-06-12 NOTE — Telephone Encounter (Signed)
-----   Message from Magnus Sinning, MD sent at 06/12/2019  2:33 PM EDT ----- Regarding: RE: gabapentin ? I want to hold the gabapentin and start lyrica 75mg  at night for 7 nights and then bid. I will send in. Similar to the gabapentin but stronger and less likely to make sleepy. ----- Message ----- From: Sherre Scarlet, RT Sent: 06/12/2019   2:10 PM EDT To: Magnus Sinning, MD Subject: FW: gabapentin ?                               Patient states that she takes a total of 600 mg of gabapentin every night. ----- Message ----- From: Magnus Sinning, MD Sent: 06/12/2019  12:58 PM EDT To: Sherre Scarlet, RT Subject: gabapentin ?                                   Was going to start her on Lyrica but she is taking gabapentin according to the records.  I need to find out how much gabapentin she takes.  If it is only at night like it says been able to switch her to Lyrica which may be stronger.

## 2019-06-12 NOTE — Telephone Encounter (Signed)
Called patient to advise  °

## 2019-06-16 ENCOUNTER — Encounter: Payer: Self-pay | Admitting: Physical Medicine and Rehabilitation

## 2019-06-16 ENCOUNTER — Ambulatory Visit (INDEPENDENT_AMBULATORY_CARE_PROVIDER_SITE_OTHER): Payer: PPO | Admitting: Physical Medicine and Rehabilitation

## 2019-06-16 ENCOUNTER — Ambulatory Visit: Payer: Self-pay

## 2019-06-16 VITALS — BP 165/80 | HR 60

## 2019-06-16 DIAGNOSIS — M5416 Radiculopathy, lumbar region: Secondary | ICD-10-CM | POA: Diagnosis not present

## 2019-06-16 MED ORDER — METHYLPREDNISOLONE ACETATE 80 MG/ML IJ SUSP
80.0000 mg | Freq: Once | INTRAMUSCULAR | Status: AC
Start: 2019-06-16 — End: 2019-06-16
  Administered 2019-06-16: 80 mg

## 2019-06-16 NOTE — Progress Notes (Signed)
.  Numeric Pain Rating Scale and Functional Assessment Average Pain 7   In the last MONTH (on 0-10 scale) has pain interfered with the following?  1. General activity like being  able to carry out your everyday physical activities such as walking, climbing stairs, carrying groceries, or moving a chair?  Rating(7)   +Driver, -BT, -Dye Allergies.   

## 2019-06-20 NOTE — Progress Notes (Signed)
Margaret DienerDeborah H Balli - 58 y.o. female MRN [id number]  Date of birth: 01/09/1961  Office Visit Note: Visit Date: 06/16/2019 PCP: Dorthula MatasJudd, Donna J, PA-C Referred by: Dorthula MatasJudd, Donna J, PA-C  Subjective: Chief Complaint  Patient presents with  . Lower Back - Pain  . Left Leg - Pain   HPI:  Margaret [name] is a 58 y.o. female who comes in today For planned diagnostic locally therapeutic left L5-S1 interlaminar epidural steroid injection.  Patient still in pretty severe pain with either sitting or standing.  It still left posterior lateral thigh and leg some anterior pain.  Diagnostic hip injection was not very beneficial at all.  Patient's course complicated by chronic pain syndrome as well as morbid obesity.  Depending on relief she gets with this injection would look at MRI of the lumbar spine versus MRI of the pelvis.  ROS Otherwise per HPI.  Assessment & Plan: Visit Diagnoses:  1. Lumbar radiculopathy     Plan: No additional findings.   Meds & Orders:  Meds ordered this encounter  Medications  . methylPREDNISolone acetate (DEPO-MEDROL) injection 80 mg    Orders Placed This Encounter  Procedures  . XR C-ARM NO REPORT  . Epidural Steroid injection    Follow-up: No follow-ups on file.   Procedures: No procedures performed  Lumbar Epidural Steroid Injection - Interlaminar Approach with Fluoroscopic Guidance  Patient: Margaret [name]      Date of Birth: 07/02/1961 MRN: [id number] PCP: Dorthula MatasJudd, Donna J, PA-C      Visit Date: 06/16/2019   Universal Protocol:     Consent Given By: the patient  Position: PRONE  Additional Comments: Vital signs were monitored before and after the procedure. Patient was prepped and draped in the usual sterile fashion. The correct patient, procedure, and site was verified.   Injection Procedure Details:  Procedure Site One Meds Administered:  Meds ordered this encounter  Medications  . methylPREDNISolone acetate (DEPO-MEDROL) injection 80 mg     Laterality: Left  Location/Site:  L5-S1  Needle size: 20 G  Needle type: Tuohy  Needle Placement: Paramedian epidural  Findings:   -Comments: Excellent flow of contrast into the epidural space.  Procedure Details: Using a paramedian approach from the side mentioned above, the region overlying the inferior lamina was localized under fluoroscopic visualization and the soft tissues overlying this structure were infiltrated with 4 ml. of 1% Lidocaine without Epinephrine. The Tuohy needle was inserted into the epidural space using a paramedian approach.   The epidural space was localized using loss of resistance along with lateral and bi-planar fluoroscopic views.  After negative aspirate for air, blood, and CSF, a 2 ml. volume of Isovue-250 was injected into the epidural space and the flow of contrast was observed. Radiographs were obtained for documentation purposes.    The injectate was administered into the level noted above.   Additional Comments:  The patient tolerated the procedure well Dressing: 2 x 2 sterile gauze and Band-Aid    Post-procedure details: Patient was observed during the procedure. Post-procedure instructions were reviewed.  Patient left the clinic in stable condition.   Clinical History: MRI LUMBAR SPINE WITHOUT CONTRAST  TECHNIQUE: Multiplanar, multisequence MR imaging of the lumbar spine was performed. No intravenous contrast was administered.  COMPARISON:  01/31/2014  FINDINGS: Segmentation:  Standard.  Alignment:  Physiologic.  Vertebrae:  No fracture, evidence of discitis, or bone lesion.  Conus medullaris: Extends to the T12 level and appears normal.  Paraspinal and other soft  tissues: No paraspinal abnormality.  Disc levels:  Disc spaces: Degenerative disc disease disc height loss at T11-12, L1-2, L3-4, L4-5 and L5-S1.  T11-12: Small central disc protrusion. No foraminal or central canal stenosis.  T12-L1: No  significant disc bulge. No evidence of neural foraminal stenosis. No central canal stenosis.  L1-L2: Broad-based disc bulge flattening the ventral thecal sac. Mild bilateral facet arthropathy. No evidence of neural foraminal stenosis. No central canal stenosis.  L2-L3: No significant disc bulge. No evidence of neural foraminal stenosis. No central canal stenosis.  L3-L4: Broad-based disc bulge flattening the ventral thecal sac and eccentric towards the left. Mild bilateral facet arthropathy. Mild left foraminal stenosis. No right foraminal stenosis. No central canal stenosis.  L4-L5: Mild broad-based disc bulge with a small left foraminal disc protrusion. Moderate bilateral facet arthropathy. Mild right and moderate left foraminal stenosis. Bilateral lateral recess narrowing. No central canal stenosis.  L5-S1: Mild broad-based disc bulge. Moderate bilateral facet arthropathy. Moderate bilateral foraminal narrowing. No central canal stenosis.  IMPRESSION: 1. Diffuse lumbar spine spondylosis as described above. 2. At L4-5 there is a mild broad-based disc bulge with a small left foraminal disc protrusion. Moderate bilateral facet arthropathy. Mild right and moderate left foraminal stenosis. Bilateral lateral recess narrowing.   Electronically Signed   By: Kathreen Devoid   On: 03/17/2017 14:58     Objective:  VS:  HT:    WT:   BMI:     BP:(!) 165/80  HR:60bpm  TEMP: ( )  RESP:  Physical Exam  Ortho Exam Imaging: No results found.

## 2019-06-20 NOTE — Procedures (Signed)
Lumbar Epidural Steroid Injection - Interlaminar Approach with Fluoroscopic Guidance  Patient: Margaret [name]      Date of Birth: [birth year] MRN: [id number] PCP: Jamesetta Orleans, PA-C      Visit Date: 06/16/2019   Universal Protocol:     Consent Given By: the patient  Position: PRONE  Additional Comments: Vital signs were monitored before and after the procedure. Patient was prepped and draped in the usual sterile fashion. The correct patient, procedure, and site was verified.   Injection Procedure Details:  Procedure Site One Meds Administered:  Meds ordered this encounter  Medications  . methylPREDNISolone acetate (DEPO-MEDROL) injection 80 mg     Laterality: Left  Location/Site:  L5-S1  Needle size: 20 G  Needle type: Tuohy  Needle Placement: Paramedian epidural  Findings:   -Comments: Excellent flow of contrast into the epidural space.  Procedure Details: Using a paramedian approach from the side mentioned above, the region overlying the inferior lamina was localized under fluoroscopic visualization and the soft tissues overlying this structure were infiltrated with 4 ml. of 1% Lidocaine without Epinephrine. The Tuohy needle was inserted into the epidural space using a paramedian approach.   The epidural space was localized using loss of resistance along with lateral and bi-planar fluoroscopic views.  After negative aspirate for air, blood, and CSF, a 2 ml. volume of Isovue-250 was injected into the epidural space and the flow of contrast was observed. Radiographs were obtained for documentation purposes.    The injectate was administered into the level noted above.   Additional Comments:  The patient tolerated the procedure well Dressing: 2 x 2 sterile gauze and Band-Aid    Post-procedure details: Patient was observed during the procedure. Post-procedure instructions were reviewed.  Patient left the clinic in stable condition.

## 2019-06-23 ENCOUNTER — Telehealth: Payer: Self-pay | Admitting: *Deleted

## 2019-06-23 DIAGNOSIS — M5416 Radiculopathy, lumbar region: Secondary | ICD-10-CM

## 2019-06-23 NOTE — Telephone Encounter (Signed)
Please order MRI of lumbar spine with diagnosis of radiculopathy, lumbar.  Could see if seeing a physical therapist could reduce some of the pain while awaiting on the MRI.

## 2019-06-27 NOTE — Telephone Encounter (Signed)
MRI ordered. Patient is willing to try PT- has been to [facility] in the past.

## 2019-06-29 ENCOUNTER — Other Ambulatory Visit: Payer: Self-pay | Admitting: Physical Medicine and Rehabilitation

## 2019-06-29 DIAGNOSIS — M5442 Lumbago with sciatica, left side: Secondary | ICD-10-CM

## 2019-06-29 DIAGNOSIS — M5416 Radiculopathy, lumbar region: Secondary | ICD-10-CM

## 2019-06-29 DIAGNOSIS — G8929 Other chronic pain: Secondary | ICD-10-CM

## 2019-06-29 NOTE — Telephone Encounter (Signed)
done

## 2019-06-29 NOTE — Telephone Encounter (Signed)
Please advise 

## 2019-07-05 ENCOUNTER — Other Ambulatory Visit: Payer: Self-pay

## 2019-07-05 ENCOUNTER — Encounter: Payer: Self-pay | Admitting: Physical Therapy

## 2019-07-05 ENCOUNTER — Ambulatory Visit: Payer: PPO | Attending: Physical Medicine and Rehabilitation | Admitting: Physical Therapy

## 2019-07-05 DIAGNOSIS — R262 Difficulty in walking, not elsewhere classified: Secondary | ICD-10-CM | POA: Diagnosis not present

## 2019-07-05 DIAGNOSIS — M5442 Lumbago with sciatica, left side: Secondary | ICD-10-CM | POA: Diagnosis not present

## 2019-07-05 DIAGNOSIS — M62830 Muscle spasm of back: Secondary | ICD-10-CM | POA: Diagnosis not present

## 2019-07-05 NOTE — Therapy (Signed)
[facility] [address] [place], [place], [address] Phone: [phone]   Fax:  [phone]  Physical Therapy Evaluation  Patient Details  Name: Margaret [name] MRN: [id number] Date of Birth: 08/20/1961 Referring Provider (PT): Alvester MorinNewton   Encounter Date: 07/05/2019  PT End of Session - 07/05/19 1547    Visit Number  1    Date for PT Re-Evaluation  09/04/19    PT Start Time  1522    PT Stop Time  1610    PT Time Calculation (min)  48 min    Activity Tolerance  Patient limited by pain    Behavior During Therapy  [facility] for tasks assessed/performed       Past Medical History:  Diagnosis Date  . Anxiety   . Arthritis    knees, lower back, hands  . Depression   . Hyperlipidemia   . Hypertension    on meds, reviewed by Dr. Jacinto HalimGanji for cardiac clearance - 09/2013, told that she was cleared & no need to follow up with him   . Hypothyroidism   . Mental disorder    perimenopause use of Paxil  . SVD (spontaneous vaginal delivery)    x 3  . Thyroid disease   . Total knee replacement status   . Wears dentures    upper and lower    Past Surgical History:  Procedure Laterality Date  . CARPAL TUNNEL RELEASE     2000, 2001, both hands  . DILATATION & CURETTAGE/HYSTEROSCOPY WITH MYOSURE N/A 05/25/2018   Procedure: DILATATION & CURETTAGE/HYSTEROSCOPY WITH MYOSURE;  Surgeon: Richardean ChimeraMcComb, John, MD;  Location: [facility];  Service: Gynecology;  Laterality: N/A;  . DILATATION & CURRETTAGE/HYSTEROSCOPY WITH RESECTOCOPE  09/23/2012   Procedure: DILATATION & CURETTAGE/HYSTEROSCOPY WITH RESECTOCOPE;  Surgeon: Juluis MireJohn S McComb, MD;  Location: [facility];  Service: Gynecology;  Laterality: N/A;  . HAND SURGERY Bilateral    carpel tunnel surgery- bilateral  . JOINT REPLACEMENT  2000, 2012,   knee replacement bilateral left-2000 & 2012, right-2002  . MULTIPLE TOOTH EXTRACTIONS     upper and lower dentures  . TONSILLECTOMY    . TOTAL KNEE REVISION   11/08/2012   Procedure: TOTAL KNEE REVISION;  Surgeon: Valeria BatmanPeter W [name], MD;  [facility];  Service: Orthopedics;  Laterality: Right;  Revision Right Total Knee Replacement   . TUBAL LIGATION    . VAGINAL BIRTH AFTER CESAREAN SECTION     83, 85,87 - all vaginal - no c/s per patient  . VAGINAL DELIVERY  83, 85, 87   x 3    There were no vitals filed for this visit.   Subjective Assessment - 07/05/19 1525    Subjective  Patient recently lost her husband, she reports that over the past month she has been having left buttock and left leg pain that goes to the left foot.  She reports that she had ablation therapy in July.  MRI is scheduled for next month.  X-rays have shown DDD and arthritis    Limitations  Sitting;Lifting;Standing;Walking;House hold activities    Patient Stated Goals  have less pain    Currently in Pain?  Yes    Pain Score  7     Pain Location  Back    Pain Orientation  Left;Lower    Pain Descriptors / Indicators  Aching;Sharp;Shooting;Stabbing    Pain Type  Acute pain    Pain Radiating Towards  pain down the left buttock, the left leg  to the ankle    Pain Onset  More than a month ago    Pain Frequency  Constant    Aggravating Factors   any activity, standing, sitting pain up to 10/10    Pain Relieving Factors  heat, rest, pain can be a 6/10    Effect of Pain on Daily Activities  reports she is limited with everything due to the pain         [facility] PT Assessment - 07/05/19 0001      Assessment   Medical Diagnosis  left low back pain with sciatica    Referring Provider (PT)  Ernestina Patches    Onset Date/Surgical Date  06/04/19    Prior Therapy  no      Precautions   Precautions  None      Balance Screen   Has the patient fallen in the past 6 months  No    Has the patient had a decrease in activity level because of a fear of falling?   No    Is the patient reluctant to leave their home because of a fear of falling?   No      Home Environment   Additional  Comments  a few steps, does some hosuework      Prior Function   Level of Independence  Independent    Vocation  Unemployed    Leisure  no exercise      Posture/Postural Control   Posture Comments  shift weight to the left, does not lean to the right      ROM / Strength   AROM / PROM / Strength  AROM;Strength      AROM   Overall AROM Comments  Lumbar ROM decreased 75% with increased pain for all motions, pain worst with extension and left side bending      Strength   Overall Strength Comments  Hips and knees 4-/5 with pain in the left low back      Flexibility   Soft Tissue Assessment /Muscle Length  yes    Hamstrings  very tight and painful    Piriformis  unable to test secondary to so much pain      Palpation   Palpation comment  She is very tight and veyr tender in the left buttock, left lumbar and into the left thigh and calf      Ambulation/Gait   Gait Comments  uses a SPC, very slow, antalgic on the left                Objective measurements completed on examination: See above findings.      [facility] Adult PT Treatment/Exercise - 07/05/19 0001      Modalities   Modalities  Electrical Stimulation;Moist Heat      Moist Heat Therapy   Number Minutes Moist Heat  15 Minutes    Moist Heat Location  Lumbar Spine      Electrical Stimulation   Electrical Stimulation Location  left buttock    Electrical Stimulation Action  IFC    Electrical Stimulation Parameters  supine    Electrical Stimulation Goals  Pain             PT Education - 07/05/19 1547    Education Details  wms flexion exercises    Person(s) Educated  Patient    Methods  Explanation;Demonstration;Handout    Comprehension  Verbalized understanding       PT Short Term Goals - 07/05/19 1551  PT SHORT TERM GOAL #1   Title  independent with initial HEP    Time  2    Period  Weeks    Status  New        PT Long Term Goals - 07/05/19 1552      PT LONG TERM GOAL #1   Title   understand posture and body mechanics    Time  8    Period  Weeks    Status  New      PT LONG TERM GOAL #2   Title  increase ROM 25%    Time  8    Period  Weeks    Status  New      PT LONG TERM GOAL #3   Title  decrease pain 50%    Time  8    Period  Weeks    Status  New      PT LONG TERM GOAL #4   Title  walk 300 feet withou pain    Time  8    Period  Weeks    Status  New             Plan - 07/05/19 1548    Clinical Impression Statement  patient reports left hip and leg pain pretty severe over the past few months.  She has a history of LBP and reports that she has had multiple ablations.  Reports that the new pain is much worse and in the left leg and it has not been like that int he past, she reports pain at all times and pretty severe, she is very limited in her ROM, movements and all ADL's due to pain    Personal Factors and Comorbidities  Comorbidity 3+    Comorbidities  TKR, LBP with multiple ablations, anxiety, depression    Stability/Clinical Decision Making  Evolving/Moderate complexity    Clinical Decision Making  Moderate    Rehab Potential  Good    PT Frequency  2x / week    PT Duration  8 weeks    PT Treatment/Interventions  ADLs/Self Care Home Management;Cryotherapy;Electrical Stimulation;Moist Heat;Traction;Ultrasound;Therapeutic activities;Therapeutic exercise;Manual techniques;Patient/family education    PT Next Visit Plan  slowly get patient moving and see if we can decrease her pain    Consulted and Agree with Plan of Care  Patient       Patient will benefit from skilled therapeutic intervention in order to improve the following deficits and impairments:  Abnormal gait, Improper body mechanics, Pain, Decreased mobility, Postural dysfunction, Decreased range of motion, Decreased strength, Decreased endurance, Decreased activity tolerance, Difficulty walking, Impaired flexibility  Visit Diagnosis: Acute left-sided low back pain with left-sided  sciatica - Plan: PT plan of care cert/re-cert  Muscle spasm of back - Plan: PT plan of care cert/re-cert  Difficulty in walking, not elsewhere classified - Plan: PT plan of care cert/re-cert     Problem List Patient Active Problem List   Diagnosis Date Noted  . Primary osteoarthritis of both hands 06/14/2017  . Status post total knee replacement, bilateral 05/17/2017  . DDD (degenerative disc disease), lumbar 05/17/2017  . Backache 11/22/2013  . Leukocytosis 03/25/2013  . Abnormal electrocardiogram 03/25/2013  . Failed total right knee replacement (HCC) 11/10/2012  . Obesity, Class III, BMI 40-49.9 (morbid obesity) (HCC) 11/10/2012  . Endometrial polyp 09/23/2012    Class: Present on Admission  . Hot flushes, perimenopausal 08/03/2012  . Menorrhagia 07/21/2012  . Menopause 07/13/2012  . Other and unspecified hyperlipidemia 07/13/2012  .  Bereavement 07/13/2012  . Essential hypertension, benign 07/13/2012  . History of migraine 07/13/2012  . Hypothyroidism 07/13/2012    Jearld LeschALBRIGHT,Janaiah Vetrano W., PT 07/05/2019, 3:55 PM  [facility] [address] [place], [place], [address] Phone: [phone]   Fax:  [phone]  Name: Margaret [name] MRN: [id number] Date of Birth: 02/06/1961

## 2019-07-07 ENCOUNTER — Telehealth: Payer: Self-pay | Admitting: Radiology

## 2019-07-07 ENCOUNTER — Other Ambulatory Visit: Payer: Self-pay | Admitting: Physical Medicine and Rehabilitation

## 2019-07-07 MED ORDER — HYDROCODONE-ACETAMINOPHEN 5-325 MG PO TABS: 1.0000 | ORAL_TABLET | Freq: Three times a day (TID) | ORAL | 0 refills | Status: DC | PRN

## 2019-07-07 NOTE — Telephone Encounter (Signed)
Can you please call pt

## 2019-07-07 NOTE — Telephone Encounter (Signed)
Patient is calling states that she is sched for MRI 07/26/2019, she states that she is in so much pain and she don't know how much more she can take of it. She has been to PT and done eval, they gave her exercises to do but hey are not helping her.  She wants to know what else we can recommned her to take or do to help her get thru till her MRI is done and she follows up from it.

## 2019-07-07 NOTE — Telephone Encounter (Signed)
I sent in small amount of hydrocodone, She should use sparingly. She should continue with the Lyrica - is she taking that twice per day. That medicine will take a week to help once consistently taking twice per day. Does not act like a medication that helps right when you take it. If she is at wits end may just need ED eval if that painful or worse. Injection x2 not beneficial and hip xray not bad.

## 2019-07-07 NOTE — Telephone Encounter (Signed)
Called pt and advised.  

## 2019-07-07 NOTE — Progress Notes (Signed)
Added small amount of hydrocodone while waiting for MRI. Can use when not controlled by her chronic tramadol.

## 2019-07-11 ENCOUNTER — Ambulatory Visit: Payer: PPO | Admitting: Physical Therapy

## 2019-07-11 ENCOUNTER — Other Ambulatory Visit: Payer: Self-pay

## 2019-07-11 DIAGNOSIS — M5442 Lumbago with sciatica, left side: Secondary | ICD-10-CM

## 2019-07-11 DIAGNOSIS — R262 Difficulty in walking, not elsewhere classified: Secondary | ICD-10-CM

## 2019-07-11 DIAGNOSIS — M62830 Muscle spasm of back: Secondary | ICD-10-CM

## 2019-07-11 NOTE — Therapy (Signed)
[facility] [place], [place], [address] Phone: [phone]   Fax:  [phone]  Physical Therapy Treatment  Patient Details  Name: Margaret [name] MRN: [id number] Date of Birth: 05/20/1961 Referring Provider (PT): Ernestina Patches   Encounter Date: 07/11/2019  PT End of Session - 07/11/19 1400    Visit Number  2    Date for PT Re-Evaluation  09/04/19    PT Start Time  1310    PT Stop Time  1410    PT Time Calculation (min)  60 min       Past Medical History:  Diagnosis Date  . Anxiety   . Arthritis    knees, lower back, hands  . Depression   . Hyperlipidemia   . Hypertension    on meds, reviewed by Dr. Einar Gip for cardiac clearance - 09/2013, told that she was cleared & no need to follow up with him   . Hypothyroidism   . Mental disorder    perimenopause use of Paxil  . SVD (spontaneous vaginal delivery)    x 3  . Thyroid disease   . Total knee replacement status   . Wears dentures    upper and lower    Past Surgical History:  Procedure Laterality Date  . CARPAL TUNNEL RELEASE     2000, 2001, both hands  . DILATATION & CURETTAGE/HYSTEROSCOPY WITH MYOSURE N/A 05/25/2018   Procedure: DILATATION & CURETTAGE/HYSTEROSCOPY WITH MYOSURE;  Surgeon: Arvella Nigh, MD;  Location: [facility];  Service: Gynecology;  Laterality: N/A;  . DILATATION & CURRETTAGE/HYSTEROSCOPY WITH RESECTOCOPE  09/23/2012   Procedure: [place];  Surgeon: Darlyn Chamber, MD;  Location: [facility];  Service: Gynecology;  Laterality: N/A;  . HAND SURGERY Bilateral    carpel tunnel surgery- bilateral  . JOINT REPLACEMENT  2000, 2012,   knee replacement bilateral left-2000 & 2012, right-2002  . MULTIPLE TOOTH EXTRACTIONS     upper and lower dentures  . TONSILLECTOMY    . TOTAL KNEE REVISION  11/08/2012   Procedure: TOTAL KNEE REVISION;  Surgeon: Garald Balding, MD;  Location: [facility];  Service:  Orthopedics;  Laterality: Right;  Revision Right Total Knee Replacement   . TUBAL LIGATION    . VAGINAL BIRTH AFTER CESAREAN SECTION     83, 85,87 - all vaginal - no c/s per patient  . VAGINAL DELIVERY  83, 85, 87   x 3    There were no vitals filed for this visit.  Subjective Assessment - 07/11/19 1309    Subjective  amb in on [facility] verb no relief even using ice pack on drive over    Currently in Pain?  Yes    Pain Score  9     Pain Location  Back    Pain Orientation  Left;Lower                       [facility] Adult PT Treatment/Exercise - 07/11/19 0001      Exercises   Exercises  Lumbar;Knee/Hip      Lumbar Exercises: Aerobic   Nustep  L 2 4 min   pt held left leg throughput     Lumbar Exercises: Seated   Other Seated Lumbar Exercises  pelvic ROM on sit fit      Modalities   Modalities  Traction;Moist Heat      Moist Heat Therapy   Number Minutes Moist Heat  10  Minutes    Moist Heat Location  Lumbar Spine      Traction   Type of Traction  Lumbar    Max (lbs)  70    Time  12      Manual Therapy   Manual Therapy  Manual Traction;Passive ROM    Manual therapy comments  STM to the left lumbar area inot the buttocks    Passive ROM  decreased tolerance on Left LE    Manual Traction  left LE distraction and sheet traction decreased leg symptoms       Trigger Point Dry Needling - 07/11/19 0001    Consent Given?  Yes    Education Handout Provided  Yes    Muscles Treated Back/Hip  Gluteus maximus    Gluteus Maximus Response  Palpable increased muscle length             PT Short Term Goals - 07/05/19 1551      PT SHORT TERM GOAL #1   Title  independent with initial HEP    Time  2    Period  Weeks    Status  New        PT Long Term Goals - 07/05/19 1552      PT LONG TERM GOAL #1   Title  understand posture and body mechanics    Time  8    Period  Weeks    Status  New      PT LONG TERM GOAL #2   Title  increase ROM 25%    Time  8     Period  Weeks    Status  New      PT LONG TERM GOAL #3   Title  decrease pain 50%    Time  8    Period  Weeks    Status  New      PT LONG TERM GOAL #4   Title  walk 300 feet withou pain    Time  8    Period  Weeks    Status  New            Plan - 07/11/19 1400    Clinical Impression Statement  pt very pain limited and leans away form left. relief with manual traction so tried mechanical traction and DN to left LB to try to alliviate pain    PT Treatment/Interventions  ADLs/Self Care Home Management;Cryotherapy;Electrical Stimulation;Moist Heat;Traction;Ultrasound;Therapeutic activities;Therapeutic exercise;Manual techniques;Patient/family education    PT Next Visit Plan  assess traction, DN and pain       Patient will benefit from skilled therapeutic intervention in order to improve the following deficits and impairments:  Abnormal gait, Improper body mechanics, Pain, Decreased mobility, Postural dysfunction, Decreased range of motion, Decreased strength, Decreased endurance, Decreased activity tolerance, Difficulty walking, Impaired flexibility  Visit Diagnosis: Muscle spasm of back  Acute left-sided low back pain with left-sided sciatica  Difficulty in walking, not elsewhere classified     Problem List Patient Active Problem List   Diagnosis Date Noted  . Primary osteoarthritis of both hands 06/14/2017  . Status post total knee replacement, bilateral 05/17/2017  . DDD (degenerative disc disease), lumbar 05/17/2017  . Backache 11/22/2013  . Leukocytosis 03/25/2013  . Abnormal electrocardiogram 03/25/2013  . Failed total right knee replacement (HCC) 11/10/2012  . Obesity, Class III, BMI 40-49.9 (morbid obesity) (HCC) 11/10/2012  . Endometrial polyp 09/23/2012    Class: Present on Admission  . Hot flushes, perimenopausal 08/03/2012  . Menorrhagia 07/21/2012  .  Menopause 07/13/2012  . Other and unspecified hyperlipidemia 07/13/2012  . Bereavement 07/13/2012  .  Essential hypertension, benign 07/13/2012  . History of migraine 07/13/2012  . Hypothyroidism 07/13/2012    [name],ANGIE  PTA 07/11/2019, 2:02 PM  [facility] [address] [place], [place], [address] Phone: [phone]   Fax:  [phone]  Name: Margaret [name] MRN: [id number] Date of Birth: February 01, 1961

## 2019-07-11 NOTE — Patient Instructions (Signed)

## 2019-07-14 ENCOUNTER — Encounter: Payer: Self-pay | Admitting: Physical Therapy

## 2019-07-14 ENCOUNTER — Other Ambulatory Visit: Payer: Self-pay

## 2019-07-14 ENCOUNTER — Other Ambulatory Visit: Payer: Self-pay | Admitting: Physical Medicine and Rehabilitation

## 2019-07-14 ENCOUNTER — Ambulatory Visit: Payer: PPO | Admitting: Physical Therapy

## 2019-07-14 DIAGNOSIS — M5442 Lumbago with sciatica, left side: Secondary | ICD-10-CM

## 2019-07-14 DIAGNOSIS — M62830 Muscle spasm of back: Secondary | ICD-10-CM

## 2019-07-14 DIAGNOSIS — R262 Difficulty in walking, not elsewhere classified: Secondary | ICD-10-CM

## 2019-07-14 NOTE — Therapy (Signed)
[facility] [place], [place], [address] Phone: [phone]   Fax:  [phone]  Physical Therapy Treatment  Patient Details  Name: Margaret [name] MRN: [id number] Date of Birth: 06-Jul-1961 Referring Provider (PT): Ernestina Patches   Encounter Date: 07/14/2019  PT End of Session - 07/14/19 0917    Visit Number  3    Date for PT Re-Evaluation  09/04/19    PT Start Time  0844    PT Stop Time  0933    PT Time Calculation (min)  49 min    Activity Tolerance  Patient limited by pain    Behavior During Therapy  [facility] for tasks assessed/performed       Past Medical History:  Diagnosis Date  . Anxiety   . Arthritis    knees, lower back, hands  . Depression   . Hyperlipidemia   . Hypertension    on meds, reviewed by Dr. Einar Gip for cardiac clearance - 09/2013, told that she was cleared & no need to follow up with him   . Hypothyroidism   . Mental disorder    perimenopause use of Paxil  . SVD (spontaneous vaginal delivery)    x 3  . Thyroid disease   . Total knee replacement status   . Wears dentures    upper and lower    Past Surgical History:  Procedure Laterality Date  . CARPAL TUNNEL RELEASE     2000, 2001, both hands  . DILATATION & CURETTAGE/HYSTEROSCOPY WITH MYOSURE N/A 05/25/2018   Procedure: DILATATION & CURETTAGE/HYSTEROSCOPY WITH MYOSURE;  Surgeon: Arvella Nigh, MD;  Location: [facility];  Service: Gynecology;  Laterality: N/A;  . DILATATION & CURRETTAGE/HYSTEROSCOPY WITH RESECTOCOPE  09/23/2012   Procedure: [place];  Surgeon: Darlyn Chamber, MD;  Location: [facility];  Service: Gynecology;  Laterality: N/A;  . HAND SURGERY Bilateral    carpel tunnel surgery- bilateral  . JOINT REPLACEMENT  2000, 2012,   knee replacement bilateral left-2000 & 2012, right-2002  . MULTIPLE TOOTH EXTRACTIONS     upper and lower dentures  . TONSILLECTOMY    . TOTAL KNEE REVISION   11/08/2012   Procedure: TOTAL KNEE REVISION;  Surgeon: Garald Balding, MD;  Location: [facility];  Service: Orthopedics;  Laterality: Right;  Revision Right Total Knee Replacement   . TUBAL LIGATION    . VAGINAL BIRTH AFTER CESAREAN SECTION     83, 85,87 - all vaginal - no c/s per patient  . VAGINAL DELIVERY  83, 85, 87   x 3    There were no vitals filed for this visit.  Subjective Assessment - 07/14/19 0848    Subjective  I felt a little better, but hurting again    Currently in Pain?  Yes    Pain Score  6     Pain Location  Back    Pain Orientation  Left;Lower    Aggravating Factors   standing, activity                       [facility] Adult PT Treatment/Exercise - 07/14/19 0001      Lumbar Exercises: Aerobic   Nustep  L 3 4 min      Lumbar Exercises: Seated   Other Seated Lumbar Exercises  pelvic ROM on sit fit, then started some stability with marches LAQ's, red tband 2 ways scapular stabilization, and then ball in lap abdominal isometrics  Traction   Type of Traction  Lumbar    Max (lbs)  75    Time  12      Manual Therapy   Manual Therapy  Manual Traction;Passive ROM    Manual therapy comments  STM to the left lumbar area inot the buttocks    Passive ROM  decreased tolerance on Left LE               PT Short Term Goals - 07/14/19 0920      PT SHORT TERM GOAL #1   Title  independent with initial HEP    Status  On-going        PT Long Term Goals - 07/05/19 1552      PT LONG TERM GOAL #1   Title  understand posture and body mechanics    Time  8    Period  Weeks    Status  New      PT LONG TERM GOAL #2   Title  increase ROM 25%    Time  8    Period  Weeks    Status  New      PT LONG TERM GOAL #3   Title  decrease pain 50%    Time  8    Period  Weeks    Status  New      PT LONG TERM GOAL #4   Title  walk 300 feet withou pain    Time  8    Period  Weeks    Status  New            Plan - 07/14/19 7341    Clinical  Impression Statement  Patient moving very well today compared to the last visit, still limited with left leg pain and limping, I upped the traction by 5 pounds    PT Next Visit Plan  will continue to try to increase her tolerance to activity and this in turn should help her function and quality of life    Consulted and Agree with Plan of Care  Patient       Patient will benefit from skilled therapeutic intervention in order to improve the following deficits and impairments:  Abnormal gait, Improper body mechanics, Pain, Decreased mobility, Postural dysfunction, Decreased range of motion, Decreased strength, Decreased endurance, Decreased activity tolerance, Difficulty walking, Impaired flexibility  Visit Diagnosis: Muscle spasm of back  Acute left-sided low back pain with left-sided sciatica  Difficulty in walking, not elsewhere classified     Problem List Patient Active Problem List   Diagnosis Date Noted  . Primary osteoarthritis of both hands 06/14/2017  . Status post total knee replacement, bilateral 05/17/2017  . DDD (degenerative disc disease), lumbar 05/17/2017  . Backache 11/22/2013  . Leukocytosis 03/25/2013  . Abnormal electrocardiogram 03/25/2013  . Failed total right knee replacement (HCC) 11/10/2012  . Obesity, Class III, BMI 40-49.9 (morbid obesity) (HCC) 11/10/2012  . Endometrial polyp 09/23/2012    Class: Present on Admission  . Hot flushes, perimenopausal 08/03/2012  . Menorrhagia 07/21/2012  . Menopause 07/13/2012  . Other and unspecified hyperlipidemia 07/13/2012  . Bereavement 07/13/2012  . Essential hypertension, benign 07/13/2012  . History of migraine 07/13/2012  . Hypothyroidism 07/13/2012    Jearld Lesch., PT 07/14/2019, 9:21 AM  [facility] [address] [place], [place], [address] Phone: [phone]   Fax:  [phone]  Name: Margaret [name] MRN: [id number] Date of Birth:  September 30, 1961

## 2019-07-14 NOTE — Telephone Encounter (Signed)
Please advise 

## 2019-07-17 ENCOUNTER — Telehealth: Payer: Self-pay | Admitting: Physical Medicine and Rehabilitation

## 2019-07-17 ENCOUNTER — Other Ambulatory Visit: Payer: Self-pay | Admitting: Physical Medicine and Rehabilitation

## 2019-07-17 MED ORDER — HYDROCODONE-ACETAMINOPHEN 5-325 MG PO TABS: 1.0000 | ORAL_TABLET | Freq: Four times a day (QID) | ORAL | 0 refills | Status: DC | PRN

## 2019-07-17 NOTE — Telephone Encounter (Signed)
Called patient to advise and asked her to call back to schedule MRI review.

## 2019-07-17 NOTE — Telephone Encounter (Signed)
I refilled with 28 tablets, this will need to last through her MRI appointment

## 2019-07-18 ENCOUNTER — Other Ambulatory Visit: Payer: Self-pay

## 2019-07-18 ENCOUNTER — Ambulatory Visit: Payer: PPO | Admitting: Physical Therapy

## 2019-07-18 DIAGNOSIS — R262 Difficulty in walking, not elsewhere classified: Secondary | ICD-10-CM

## 2019-07-18 DIAGNOSIS — M5442 Lumbago with sciatica, left side: Secondary | ICD-10-CM

## 2019-07-18 DIAGNOSIS — M62830 Muscle spasm of back: Secondary | ICD-10-CM

## 2019-07-18 NOTE — Therapy (Signed)
[place], [place], [address] Phone: [phone]   Fax:  [phone]  Physical Therapy Treatment  Patient Details  Name: Margaret [name] MRN: [id number] Date of Birth: 24-Sep-1961 Referring Provider (PT): Ernestina Patches   Encounter Date: 07/18/2019  PT End of Session - 07/18/19 1456    Visit Number  4    Date for PT Re-Evaluation  09/04/19    PT Start Time  1400    PT Stop Time  1450    PT Time Calculation (min)  50 min       Past Medical History:  Diagnosis Date  . Anxiety   . Arthritis    knees, lower back, hands  . Depression   . Hyperlipidemia   . Hypertension    on meds, reviewed by Dr. Einar Gip for cardiac clearance - 09/2013, told that she was cleared & no need to follow up with him   . Hypothyroidism   . Mental disorder    perimenopause use of Paxil  . SVD (spontaneous vaginal delivery)    x 3  . Thyroid disease   . Total knee replacement status   . Wears dentures    upper and lower    Past Surgical History:  Procedure Laterality Date  . CARPAL TUNNEL RELEASE     2000, 2001, both hands  . DILATATION & CURETTAGE/HYSTEROSCOPY WITH MYOSURE N/A 05/25/2018   Procedure: DILATATION & CURETTAGE/HYSTEROSCOPY WITH MYOSURE;  Surgeon: Arvella Nigh, MD;  Location: [facility];  Service: Gynecology;  Laterality: N/A;  . DILATATION & CURRETTAGE/HYSTEROSCOPY WITH RESECTOCOPE  09/23/2012   Procedure: [place];  Surgeon: Darlyn Chamber, MD;  Location: [facility];  Service: Gynecology;  Laterality: N/A;  . HAND SURGERY Bilateral    carpel tunnel surgery- bilateral  . JOINT REPLACEMENT  2000, 2012,   knee replacement bilateral left-2000 & 2012, right-2002  . MULTIPLE TOOTH EXTRACTIONS     upper and lower dentures  . TONSILLECTOMY    . TOTAL KNEE REVISION  11/08/2012   Procedure: TOTAL KNEE REVISION;  Surgeon: Garald Balding, MD;  Location: [facility];  Service:  Orthopedics;  Laterality: Right;  Revision Right Total Knee Replacement   . TUBAL LIGATION    . VAGINAL BIRTH AFTER CESAREAN SECTION     83, 85,87 - all vaginal - no c/s per patient  . VAGINAL DELIVERY  83, 85, 87   x 3    There were no vitals filed for this visit.  Subjective Assessment - 07/18/19 1407    Subjective  tx helps- temporary. I am moving better. Still have to drive with CP on leg    Currently in Pain?  Yes    Pain Score  5     Pain Location  Back                       [facility] Adult PT Treatment/Exercise - 07/18/19 0001      Lumbar Exercises: Seated   Other Seated Lumbar Exercises  pelvic ROM on sit fit, stability with marches LAQ's, and hip abd with red tband, red tband 2 ways scapular stabilization, and then ball in lap abdominal isometrics   add ball squeeze     Traction   Type of Traction  Lumbar    Max (lbs)  80    Time  12      Manual Therapy   Manual Therapy  Neural Stretch  Passive ROM  HS     Neural Stretch  positive NT on Left LE   showed pt how to do NT stretch at home              PT Short Term Goals - 07/18/19 1456      PT SHORT TERM GOAL #1   Title  independent with initial HEP    Baseline  gentle stretching and pelvic ROM    Status  Achieved        PT Long Term Goals - 07/05/19 1552      PT LONG TERM GOAL #1   Title  understand posture and body mechanics    Time  8    Period  Weeks    Status  New      PT LONG TERM GOAL #2   Title  increase ROM 25%    Time  8    Period  Weeks    Status  New      PT LONG TERM GOAL #3   Title  decrease pain 50%    Time  8    Period  Weeks    Status  New      PT LONG TERM GOAL #4   Title  walk 300 feet withou pain    Time  8    Period  Weeks    Status  New            Plan - 07/18/19 1456    Clinical Impression Statement  pt moving much better and did tolerate increase in ex and added some resistance. added NT stretching and issued for HEP seated and supine.  pt feels relief with DN and traction.STG met    PT Treatment/Interventions  ADLs/Self Care Home Management;Cryotherapy;Electrical Stimulation;Moist Heat;Traction;Ultrasound;Therapeutic activities;Therapeutic exercise;Manual techniques;Patient/family education    PT Next Visit Plan  will continue to try to increase her tolerance to activity and this in turn should help her function and quality of life       Patient will benefit from skilled therapeutic intervention in order to improve the following deficits and impairments:  Abnormal gait, Improper body mechanics, Pain, Decreased mobility, Postural dysfunction, Decreased range of motion, Decreased strength, Decreased endurance, Decreased activity tolerance, Difficulty walking, Impaired flexibility  Visit Diagnosis: Muscle spasm of back  Acute left-sided low back pain with left-sided sciatica  Difficulty in walking, not elsewhere classified     Problem List Patient Active Problem List   Diagnosis Date Noted  . Primary osteoarthritis of both hands 06/14/2017  . Status post total knee replacement, bilateral 05/17/2017  . DDD (degenerative disc disease), lumbar 05/17/2017  . Backache 11/22/2013  . Leukocytosis 03/25/2013  . Abnormal electrocardiogram 03/25/2013  . Failed total right knee replacement ([place]) 11/10/2012  . Obesity, Class III, BMI 40-49.9 (morbid obesity) ([place]) 11/10/2012  . Endometrial polyp 09/23/2012    Class: Present on Admission  . Hot flushes, perimenopausal 08/03/2012  . Menorrhagia 07/21/2012  . Menopause 07/13/2012  . Other and unspecified hyperlipidemia 07/13/2012  . Bereavement 07/13/2012  . Essential hypertension, benign 07/13/2012  . History of migraine 07/13/2012  . Hypothyroidism 07/13/2012    Evelena Masci,ANGIE PTA 07/18/2019, 2:58 PM  [facility] [place] [address] [place], [place], [address] Phone: [phone]   Fax:  [phone]  Name: CEANNA WAREING MRN: [id number] Date of Birth: 1961/08/21

## 2019-07-19 DIAGNOSIS — F320 Major depressive disorder, single episode, mild: Secondary | ICD-10-CM | POA: Diagnosis not present

## 2019-07-21 ENCOUNTER — Ambulatory Visit: Payer: PPO | Attending: Physical Medicine and Rehabilitation | Admitting: Physical Therapy

## 2019-07-21 ENCOUNTER — Other Ambulatory Visit: Payer: Self-pay

## 2019-07-21 DIAGNOSIS — M62830 Muscle spasm of back: Secondary | ICD-10-CM | POA: Insufficient documentation

## 2019-07-21 DIAGNOSIS — M5442 Lumbago with sciatica, left side: Secondary | ICD-10-CM | POA: Insufficient documentation

## 2019-07-21 DIAGNOSIS — F32A Depression, unspecified: Secondary | ICD-10-CM | POA: Insufficient documentation

## 2019-07-21 DIAGNOSIS — R262 Difficulty in walking, not elsewhere classified: Secondary | ICD-10-CM | POA: Diagnosis not present

## 2019-07-21 NOTE — Therapy (Signed)
[place], [place], [address] Phone: [phone]   Fax:  [phone]  Physical Therapy Treatment  Patient Details  Name: Margaret [name] MRN: [id number] Date of Birth: June 22, 1961 Referring Provider (PT): Ernestina Patches   Encounter Date: 07/21/2019  PT End of Session - 07/21/19 1003    Visit Number  5    Date for PT Re-Evaluation  09/04/19    PT Start Time  0930    PT Stop Time  1022    PT Time Calculation (min)  52 min       Past Medical History:  Diagnosis Date  . Anxiety   . Arthritis    knees, lower back, hands  . Depression   . Hyperlipidemia   . Hypertension    on meds, reviewed by Dr. Einar Gip for cardiac clearance - 09/2013, told that she was cleared & no need to follow up with him   . Hypothyroidism   . Mental disorder    perimenopause use of Paxil  . SVD (spontaneous vaginal delivery)    x 3  . Thyroid disease   . Total knee replacement status   . Wears dentures    upper and lower    Past Surgical History:  Procedure Laterality Date  . CARPAL TUNNEL RELEASE     2000, 2001, both hands  . DILATATION & CURETTAGE/HYSTEROSCOPY WITH MYOSURE N/A 05/25/2018   Procedure: DILATATION & CURETTAGE/HYSTEROSCOPY WITH MYOSURE;  Surgeon: Arvella Nigh, MD;  Location: [facility];  Service: Gynecology;  Laterality: N/A;  . DILATATION & CURRETTAGE/HYSTEROSCOPY WITH RESECTOCOPE  09/23/2012   Procedure: [place];  Surgeon: Darlyn Chamber, MD;  Location: [facility];  Service: Gynecology;  Laterality: N/A;  . HAND SURGERY Bilateral    carpel tunnel surgery- bilateral  . JOINT REPLACEMENT  2000, 2012,   knee replacement bilateral left-2000 & 2012, right-2002  . MULTIPLE TOOTH EXTRACTIONS     upper and lower dentures  . TONSILLECTOMY    . TOTAL KNEE REVISION  11/08/2012   Procedure: TOTAL KNEE REVISION;  Surgeon: Garald Balding, MD;  Location: [facility];  Service:  Orthopedics;  Laterality: Right;  Revision Right Total Knee Replacement   . TUBAL LIGATION    . VAGINAL BIRTH AFTER CESAREAN SECTION     83, 85,87 - all vaginal - no c/s per patient  . VAGINAL DELIVERY  83, 85, 87   x 3    There were no vitals filed for this visit.  Subjective Assessment - 07/21/19 0932    Subjective  really hurting this morning. tx helps temporary but any activity ( ie had to move garbage cans) flare it up    Currently in Pain?  Yes    Pain Score  10-Worst pain ever    Pain Location  Back    Pain Orientation  Left;Lower                       [facility] Adult PT Treatment/Exercise - 07/21/19 0001      Lumbar Exercises: Standing   Other Standing Lumbar Exercises  REIS- increased pain back and leg      Lumbar Exercises: Seated   Other Seated Lumbar Exercises  pelvic ROM on sit fit, stability with marches LAQ's,marching and hip abd with red tband, red tband 2 ways scapular stabilization, and then ball in lap abdominal isometrics   add squeeze   Other Seated Lumbar Exercises  black tband trunk flex and ext 15 x      Traction   Type of Traction  Lumbar    Max (lbs)  80    Time  15      Manual Therapy   Manual Therapy  Neural Stretch    Neural Stretch  pt does get relief from this an dis doing at home               PT Short Term Goals - 07/18/19 1456      PT SHORT TERM GOAL #1   Title  independent with initial HEP    Baseline  gentle stretching and pelvic ROM    Status  Achieved        PT Long Term Goals - 07/21/19 0954      PT LONG TERM GOAL #1   Title  understand posture and body mechanics    Status  On-going      PT LONG TERM GOAL #2   Title  increase ROM 25%    Baseline  limited and guarded d/t pain    Status  On-going      PT LONG TERM GOAL #3   Title  decrease pain 50%    Status  On-going      PT LONG TERM GOAL #4   Title  walk 300 feet withou pain    Status  On-going            Plan - 07/21/19 0955     Clinical Impression Statement  pt arrived in 10/10 pain, better after ther ex - and add'l relief with DN and tratcion. Started ext ex and explained centralization and asked pt to try over weekend but stop if too painful and she VU. Pt is doing NT stretch at home with relief.    PT Treatment/Interventions  ADLs/Self Care Home Management;Cryotherapy;Electrical Stimulation;Moist Heat;Traction;Ultrasound;Therapeutic activities;Therapeutic exercise;Manual techniques;Patient/family education    PT Next Visit Plan  will continue to try to increase her tolerance to activity and this in turn should help her function and quality of life       Patient will benefit from skilled therapeutic intervention in order to improve the following deficits and impairments:  Abnormal gait, Improper body mechanics, Pain, Decreased mobility, Postural dysfunction, Decreased range of motion, Decreased strength, Decreased endurance, Decreased activity tolerance, Difficulty walking, Impaired flexibility  Visit Diagnosis: Muscle spasm of back  Acute left-sided low back pain with left-sided sciatica  Difficulty in walking, not elsewhere classified     Problem List Patient Active Problem List   Diagnosis Date Noted  . Primary osteoarthritis of both hands 06/14/2017  . Status post total knee replacement, bilateral 05/17/2017  . DDD (degenerative disc disease), lumbar 05/17/2017  . Backache 11/22/2013  . Leukocytosis 03/25/2013  . Abnormal electrocardiogram 03/25/2013  . Failed total right knee replacement (HCC) 11/10/2012  . Obesity, Class III, BMI 40-49.9 (morbid obesity) (HCC) 11/10/2012  . Endometrial polyp 09/23/2012    Class: Present on Admission  . Hot flushes, perimenopausal 08/03/2012  . Menorrhagia 07/21/2012  . Menopause 07/13/2012  . Other and unspecified hyperlipidemia 07/13/2012  . Bereavement 07/13/2012  . Essential hypertension, benign 07/13/2012  . History of migraine 07/13/2012  . Hypothyroidism  07/13/2012    Margaret [name],ANGIE 07/21/2019, 10:04 AM  [facility] [address] [place], [place], [address] Phone: [phone]   Fax:  [phone]  Name: Margaret [name] MRN: [id number] Date of Birth: 01/07/1961

## 2019-07-25 ENCOUNTER — Other Ambulatory Visit: Payer: Self-pay

## 2019-07-25 ENCOUNTER — Ambulatory Visit: Payer: PPO | Admitting: Physical Therapy

## 2019-07-25 DIAGNOSIS — M62830 Muscle spasm of back: Secondary | ICD-10-CM | POA: Diagnosis not present

## 2019-07-25 DIAGNOSIS — R262 Difficulty in walking, not elsewhere classified: Secondary | ICD-10-CM

## 2019-07-25 DIAGNOSIS — M5442 Lumbago with sciatica, left side: Secondary | ICD-10-CM

## 2019-07-25 NOTE — Therapy (Signed)
[place], [place], [address] Phone: [phone]   Fax:  [phone]  Physical Therapy Treatment  Patient Details  Name: Margaret [name] MRN: [id number] Date of Birth: [birth year] Referring Provider (PT): Ernestina Patches   Encounter Date: 07/25/2019  PT End of Session - 07/25/19 0959    Visit Number  6    Date for PT Re-Evaluation  09/04/19    PT Start Time  0926    PT Stop Time  3762    PT Time Calculation (min)  48 min       Past Medical History:  Diagnosis Date  . Anxiety   . Arthritis    knees, lower back, hands  . Depression   . Hyperlipidemia   . Hypertension    on meds, reviewed by Dr. Einar Gip for cardiac clearance - 09/2013, told that she was cleared & no need to follow up with him   . Hypothyroidism   . Mental disorder    perimenopause use of Paxil  . SVD (spontaneous vaginal delivery)    x 3  . Thyroid disease   . Total knee replacement status   . Wears dentures    upper and lower    Past Surgical History:  Procedure Laterality Date  . CARPAL TUNNEL RELEASE     2000, 2001, both hands  . DILATATION & CURETTAGE/HYSTEROSCOPY WITH MYOSURE N/A 05/25/2018   Procedure: DILATATION & CURETTAGE/HYSTEROSCOPY WITH MYOSURE;  Surgeon: Arvella Nigh, MD;  Location: [facility];  Service: Gynecology;  Laterality: N/A;  . DILATATION & CURRETTAGE/HYSTEROSCOPY WITH RESECTOCOPE  09/23/2012   Procedure: [place];  Surgeon: Darlyn Chamber, MD;  Location: [facility];  Service: Gynecology;  Laterality: N/A;  . HAND SURGERY Bilateral    carpel tunnel surgery- bilateral  . JOINT REPLACEMENT  2000, 2012,   knee replacement bilateral left-2000 & 2012, right-2002  . MULTIPLE TOOTH EXTRACTIONS     upper and lower dentures  . TONSILLECTOMY    . TOTAL KNEE REVISION  11/08/2012   Procedure: TOTAL KNEE REVISION;  Surgeon: Garald Balding, MD;  Location: [facility];  Service:  Orthopedics;  Laterality: Right;  Revision Right Total Knee Replacement   . TUBAL LIGATION    . VAGINAL BIRTH AFTER CESAREAN SECTION     83, 85,87 - all vaginal - no c/s per patient  . VAGINAL DELIVERY  83, 85, 87   x 3    There were no vitals filed for this visit.  Subjective Assessment - 07/25/19 0929    Subjective  pain is traveling more into front of leg to groin. EXT definately made it worse.worse as day goes on- have to lay ftlat on bed with heat/ice    Currently in Pain?  Yes    Pain Score  6     Pain Location  Back    Pain Orientation  Left;Lower                       [facility] Adult PT Treatment/Exercise - 07/25/19 0001      Lumbar Exercises: Seated   Other Seated Lumbar Exercises  pelvic ROM on sit fit, stability with marches LAQ's,marching and hip abd with red tband, red tband 2 ways scapular stabilization, and then ball in lap abdominal isometrics   add squeeze   Other Seated Lumbar Exercises  black tband trunk flex and ext 15 x      Lumbar  Exercises: Supine   Other Supine Lumbar Exercises  ppt 10 reps with verb and tactile cuing    Other Supine Lumbar Exercises  ppt with hip flexion 8 each side      Traction   Type of Traction  Lumbar    Max (lbs)  80    Time  15             PT Education - 07/25/19 0959    Education Details  ppt    Person(s) Educated  Patient    Methods  Explanation;Demonstration    Comprehension  Verbalized understanding;Returned demonstration       PT Short Term Goals - 07/18/19 1456      PT SHORT TERM GOAL #1   Title  independent with initial HEP    Baseline  gentle stretching and pelvic ROM    Status  Achieved        PT Long Term Goals - 07/21/19 0954      PT LONG TERM GOAL #1   Title  understand posture and body mechanics    Status  On-going      PT LONG TERM GOAL #2   Title  increase ROM 25%    Baseline  limited and guarded d/t pain    Status  On-going      PT LONG TERM GOAL #3   Title  decrease  pain 50%    Status  On-going      PT LONG TERM GOAL #4   Title  walk 300 feet withou pain    Status  On-going            Plan - 07/25/19 0959    Clinical Impression Statement  trial today without DN ( will add back if need be) progress ex with reps and supien stab ex. pt still pain limited but moving better. MRI tomorrow    PT Treatment/Interventions  ADLs/Self Care Home Management;Cryotherapy;Electrical Stimulation;Moist Heat;Traction;Ultrasound;Therapeutic activities;Therapeutic exercise;Manual techniques;Patient/family education    PT Next Visit Plan  assess and progress       Patient will benefit from skilled therapeutic intervention in order to improve the following deficits and impairments:  Abnormal gait, Improper body mechanics, Pain, Decreased mobility, Postural dysfunction, Decreased range of motion, Decreased strength, Decreased endurance, Decreased activity tolerance, Difficulty walking, Impaired flexibility  Visit Diagnosis: Muscle spasm of back  Acute left-sided low back pain with left-sided sciatica  Difficulty in walking, not elsewhere classified     Problem List Patient Active Problem List   Diagnosis Date Noted  . Primary osteoarthritis of both hands 06/14/2017  . Status post total knee replacement, bilateral 05/17/2017  . DDD (degenerative disc disease), lumbar 05/17/2017  . Backache 11/22/2013  . Leukocytosis 03/25/2013  . Abnormal electrocardiogram 03/25/2013  . Failed total right knee replacement (HCC) 11/10/2012  . Obesity, Class III, BMI 40-49.9 (morbid obesity) (HCC) 11/10/2012  . Endometrial polyp 09/23/2012    Class: Present on Admission  . Hot flushes, perimenopausal 08/03/2012  . Menorrhagia 07/21/2012  . Menopause 07/13/2012  . Other and unspecified hyperlipidemia 07/13/2012  . Bereavement 07/13/2012  . Essential hypertension, benign 07/13/2012  . History of migraine 07/13/2012  . Hypothyroidism 07/13/2012    Mahmud Keithly,ANGIE  PTA 07/25/2019, 10:01 AM  [facility] [address] [place], [place], [address] Phone: [phone]   Fax:  [phone]  Name: DEVORAH GIVHAN MRN: [id number] Date of Birth: 18-Jan-1961

## 2019-07-26 ENCOUNTER — Ambulatory Visit
Admission: RE | Admit: 2019-07-26 | Discharge: 2019-07-26 | Disposition: A | Discharge status: Home / Self Care | Payer: PPO | Source: Ambulatory Visit | Attending: Physical Medicine and Rehabilitation | Admitting: Physical Medicine and Rehabilitation

## 2019-07-26 DIAGNOSIS — M5416 Radiculopathy, lumbar region: Secondary | ICD-10-CM

## 2019-07-26 DIAGNOSIS — M48061 Spinal stenosis, lumbar region without neurogenic claudication: Secondary | ICD-10-CM | POA: Diagnosis not present

## 2019-07-28 ENCOUNTER — Ambulatory Visit: Payer: PPO | Admitting: Physical Therapy

## 2019-07-28 ENCOUNTER — Other Ambulatory Visit: Payer: Self-pay

## 2019-07-28 ENCOUNTER — Encounter: Payer: Self-pay | Admitting: Physical Therapy

## 2019-07-28 DIAGNOSIS — M5442 Lumbago with sciatica, left side: Secondary | ICD-10-CM

## 2019-07-28 DIAGNOSIS — M62830 Muscle spasm of back: Secondary | ICD-10-CM | POA: Diagnosis not present

## 2019-07-28 DIAGNOSIS — R262 Difficulty in walking, not elsewhere classified: Secondary | ICD-10-CM

## 2019-07-28 NOTE — Therapy (Signed)
[place], [place], [address] Phone: [phone]   Fax:  [phone]  Physical Therapy Treatment  Patient Details  Name: Margaret [name] MRN: [id number] Date of Birth: [birth year] Referring Provider (PT): Ernestina Patches   Encounter Date: 07/28/2019  PT End of Session - 07/28/19 1004    Visit Number  7    Date for PT Re-Evaluation  09/04/19    PT Start Time  0930    PT Stop Time  1017    PT Time Calculation (min)  47 min    Activity Tolerance  Patient limited by pain    Behavior During Therapy  [facility] for tasks assessed/performed       Past Medical History:  Diagnosis Date  . Anxiety   . Arthritis    knees, lower back, hands  . Depression   . Hyperlipidemia   . Hypertension    on meds, reviewed by Dr. Einar Gip for cardiac clearance - 09/2013, told that she was cleared & no need to follow up with him   . Hypothyroidism   . Mental disorder    perimenopause use of Paxil  . SVD (spontaneous vaginal delivery)    x 3  . Thyroid disease   . Total knee replacement status   . Wears dentures    upper and lower    Past Surgical History:  Procedure Laterality Date  . CARPAL TUNNEL RELEASE     2000, 2001, both hands  . DILATATION & CURETTAGE/HYSTEROSCOPY WITH MYOSURE N/A 05/25/2018   Procedure: DILATATION & CURETTAGE/HYSTEROSCOPY WITH MYOSURE;  Surgeon: Arvella Nigh, MD;  Location: [facility];  Service: Gynecology;  Laterality: N/A;  . DILATATION & CURRETTAGE/HYSTEROSCOPY WITH RESECTOCOPE  09/23/2012   Procedure: [place];  Surgeon: Darlyn Chamber, MD;  Location: [facility];  Service: Gynecology;  Laterality: N/A;  . HAND SURGERY Bilateral    carpel tunnel surgery- bilateral  . JOINT REPLACEMENT  2000, 2012,   knee replacement bilateral left-2000 & 2012, right-2002  . MULTIPLE TOOTH EXTRACTIONS     upper and lower dentures  . TONSILLECTOMY    . TOTAL KNEE REVISION   11/08/2012   Procedure: TOTAL KNEE REVISION;  Surgeon: Garald Balding, MD;  Location: [facility];  Service: Orthopedics;  Laterality: Right;  Revision Right Total Knee Replacement   . TUBAL LIGATION    . VAGINAL BIRTH AFTER CESAREAN SECTION     83, 85,87 - all vaginal - no c/s per patient  . VAGINAL DELIVERY  83, 85, 87   x 3    There were no vitals filed for this visit.  Subjective Assessment - 07/28/19 0938    Subjective  Patient reports that she had the MRI, she was called yesterday by a nurse that explained the results to her, she has a protrusion of the disc, at L4-5 that impinges the left L4 nerve root, the radiologist says that it is worse than the MRI in 2018.  She will be having an injection next week    Currently in Pain?  Yes    Pain Score  6     Pain Location  Back    Pain Orientation  Left;Lower    Pain Descriptors / Indicators  Aching    Aggravating Factors   activity    Pain Relieving Factors  heat and traction seems to help some  [facility] Adult PT Treatment/Exercise - 07/28/19 0001      Lumbar Exercises: Aerobic   UBE (Upper Arm Bike)  level 2 x 4 minutes    Nustep  L 3 5 min      Lumbar Exercises: Seated   Other Seated Lumbar Exercises  pelvic ROM on sit fit, stability with marches LAQ's,marching and hip abd with red tband, red tband 2 ways scapular stabilization, and then ball in lap abdominal isometrics, some seated "flossing" with head flexion and extnesion and left leg extension    Other Seated Lumbar Exercises  black tband trunk flex and ext 15 x      Traction   Type of Traction  Lumbar    Max (lbs)  85    Time  15               PT Short Term Goals - 07/18/19 1456      PT SHORT TERM GOAL #1   Title  independent with initial HEP    Baseline  gentle stretching and pelvic ROM    Status  Achieved        PT Long Term Goals - 07/28/19 1005      PT LONG TERM GOAL #1   Title  understand posture and body mechanics     Status  Partially Met      PT LONG TERM GOAL #2   Title  increase ROM 25%    Status  On-going      PT LONG TERM GOAL #3   Title  decrease pain 50%    Status  On-going      PT LONG TERM GOAL #4   Title  walk 300 feet withou pain    Status  On-going            Plan - 07/28/19 1004    Clinical Impression Statement  Talked with patient about the MRI results as she had already been contacted by the nurse, she is scheduled for injection next week.  I tried some nerve flossing with head and leg motions today.    PT Next Visit Plan  will see her 1x next week and then she will have injection    Consulted and Agree with Plan of Care  Patient       Patient will benefit from skilled therapeutic intervention in order to improve the following deficits and impairments:  Abnormal gait, Improper body mechanics, Pain, Decreased mobility, Postural dysfunction, Decreased range of motion, Decreased strength, Decreased endurance, Decreased activity tolerance, Difficulty walking, Impaired flexibility  Visit Diagnosis: Muscle spasm of back  Acute left-sided low back pain with left-sided sciatica  Difficulty in walking, not elsewhere classified     Problem List Patient Active Problem List   Diagnosis Date Noted  . Primary osteoarthritis of both hands 06/14/2017  . Status post total knee replacement, bilateral 05/17/2017  . DDD (degenerative disc disease), lumbar 05/17/2017  . Backache 11/22/2013  . Leukocytosis 03/25/2013  . Abnormal electrocardiogram 03/25/2013  . Failed total right knee replacement ([place]) 11/10/2012  . Obesity, Class III, BMI 40-49.9 (morbid obesity) ([place]) 11/10/2012  . Endometrial polyp 09/23/2012    Class: Present on Admission  . Hot flushes, perimenopausal 08/03/2012  . Menorrhagia 07/21/2012  . Menopause 07/13/2012  . Other and unspecified hyperlipidemia 07/13/2012  . Bereavement 07/13/2012  . Essential hypertension, benign 07/13/2012  . History of migraine  07/13/2012  . Hypothyroidism 07/13/2012    Sumner Boast., PT 07/28/2019, 10:07 AM  [facility]  [address] [place], [place], [address] Phone: [phone]   Fax:  [phone]  Name: Margaret [name] MRN: [id number] Date of Birth: [birth year]

## 2019-07-31 ENCOUNTER — Telehealth: Payer: Self-pay | Admitting: Physical Medicine and Rehabilitation

## 2019-07-31 ENCOUNTER — Other Ambulatory Visit: Payer: Self-pay | Admitting: Orthopedic Surgery

## 2019-07-31 MED ORDER — TRAMADOL HCL 50 MG PO TABS: 50.0000 mg | ORAL_TABLET | Freq: Two times a day (BID) | ORAL | 0 refills | Status: DC | PRN

## 2019-07-31 NOTE — Telephone Encounter (Signed)
Margaret [name] was advise and will call patient about her medication.

## 2019-07-31 NOTE — Telephone Encounter (Signed)
rx sent for tramedol

## 2019-07-31 NOTE — Telephone Encounter (Signed)
Ok, will send to Dr Sharol Given. Thanks

## 2019-08-01 ENCOUNTER — Encounter: Payer: PPO | Admitting: Physical Therapy

## 2019-08-03 ENCOUNTER — Encounter: Payer: Self-pay | Admitting: Physical Medicine and Rehabilitation

## 2019-08-03 ENCOUNTER — Ambulatory Visit: Payer: Self-pay

## 2019-08-03 ENCOUNTER — Other Ambulatory Visit: Payer: Self-pay

## 2019-08-03 ENCOUNTER — Ambulatory Visit (INDEPENDENT_AMBULATORY_CARE_PROVIDER_SITE_OTHER): Payer: PPO | Admitting: Physical Medicine and Rehabilitation

## 2019-08-03 VITALS — BP 163/77 | HR 61

## 2019-08-03 DIAGNOSIS — M5116 Intervertebral disc disorders with radiculopathy, lumbar region: Secondary | ICD-10-CM | POA: Diagnosis not present

## 2019-08-03 DIAGNOSIS — M5416 Radiculopathy, lumbar region: Secondary | ICD-10-CM

## 2019-08-03 MED ORDER — BETAMETHASONE SOD PHOS & ACET 6 (3-3) MG/ML IJ SUSP
12.0000 mg | Freq: Once | INTRAMUSCULAR | Status: AC
  Administered 2019-08-03: 16:00:00 12 mg

## 2019-08-03 MED ORDER — HYDROCODONE-ACETAMINOPHEN 5-325 MG PO TABS: 1.0000 | ORAL_TABLET | Freq: Three times a day (TID) | ORAL | 0 refills | Status: DC | PRN

## 2019-08-03 NOTE — Progress Notes (Signed)
 .  Numeric Pain Rating Scale and Functional Assessment Average Pain 8   In the last MONTH (on 0-10 scale) has pain interfered with the following?  1. General activity like being  able to carry out your everyday physical activities such as walking, climbing stairs, carrying groceries, or moving a chair?  Rating(8)   +Driver, -BT, -Dye Allergies.  

## 2019-08-04 ENCOUNTER — Encounter: Payer: PPO | Admitting: Physical Therapy

## 2019-08-09 ENCOUNTER — Encounter: Payer: PPO | Admitting: Physical Medicine and Rehabilitation

## 2019-08-16 DIAGNOSIS — R2243 Localized swelling, mass and lump, lower limb, bilateral: Secondary | ICD-10-CM | POA: Diagnosis not present

## 2019-08-16 DIAGNOSIS — Z23 Encounter for immunization: Secondary | ICD-10-CM | POA: Diagnosis not present

## 2019-08-16 DIAGNOSIS — F320 Major depressive disorder, single episode, mild: Secondary | ICD-10-CM | POA: Diagnosis not present

## 2019-08-16 DIAGNOSIS — F329 Major depressive disorder, single episode, unspecified: Secondary | ICD-10-CM | POA: Diagnosis not present

## 2019-08-16 DIAGNOSIS — R0602 Shortness of breath: Secondary | ICD-10-CM | POA: Diagnosis not present

## 2019-08-16 DIAGNOSIS — R600 Localized edema: Secondary | ICD-10-CM | POA: Diagnosis not present

## 2019-08-16 DIAGNOSIS — F4321 Adjustment disorder with depressed mood: Secondary | ICD-10-CM | POA: Diagnosis not present

## 2019-08-17 ENCOUNTER — Telehealth: Payer: Self-pay | Admitting: Physical Medicine and Rehabilitation

## 2019-08-17 NOTE — Telephone Encounter (Signed)
Patient reports that she had about 30% relief. Patient also states that she is slightly concerned about the Lyrica because she is having quite a bit of swelling in both ankles.

## 2019-08-17 NOTE — Procedures (Signed)
Lumbosacral Transforaminal Epidural Steroid Injection - Sub-Pedicular Approach with Fluoroscopic Guidance  Patient: Margaret [name]      Date of Birth: [birth year] MRN: [id number] PCP: Mina Marble, PA-C      Visit Date: 08/03/2019   Universal Protocol:    Date/Time: 08/03/2019  Consent Given By: the patient  Position: PRONE  Additional Comments: Vital signs were monitored before and after the procedure. Patient was prepped and draped in the usual sterile fashion. The correct patient, procedure, and site was verified.   Injection Procedure Details:  Procedure Site One Meds Administered:  Meds ordered this encounter  Medications  . betamethasone acetate-betamethasone sodium phosphate (CELESTONE) injection 12 mg  . HYDROcodone-acetaminophen (NORCO/VICODIN) 5-325 MG tablet    Sig: Take 1 tablet by mouth every 8 (eight) hours as needed for up to 5 days for moderate pain or severe pain.    Dispense:  15 tablet    Refill:  0    Laterality: Left  Location/Site:  L4-L5  Needle size: 22 G  Needle type: Spinal  Needle Placement: Transforaminal  Findings:    -Comments: Excellent flow of contrast along the nerve and into the epidural space.  Procedure Details: After squaring off the end-plates to get a true AP view, the C-arm was positioned so that an oblique view of the foramen as noted above was visualized. The target area is just inferior to the "nose of the scotty dog" or sub pedicular. The soft tissues overlying this structure were infiltrated with 2-3 ml. of 1% Lidocaine without Epinephrine.  The spinal needle was inserted toward the target using a "trajectory" view along the fluoroscope beam.  Under AP and lateral visualization, the needle was advanced so it did not puncture dura and was located close the 6 O'Clock position of the pedical in AP tracterory. Biplanar projections were used to confirm position. Aspiration was confirmed to be negative for CSF and/or blood.  A 1-2 ml. volume of Isovue-250 was injected and flow of contrast was noted at each level. Radiographs were obtained for documentation purposes.   After attaining the desired flow of contrast documented above, a 0.5 to 1.0 ml test dose of 0.25% Marcaine was injected into each respective transforaminal space.  The patient was observed for 90 seconds post injection.  After no sensory deficits were reported, and normal lower extremity motor function was noted,   the above injectate was administered so that equal amounts of the injectate were placed at each foramen (level) into the transforaminal epidural space.   Additional Comments:  The patient tolerated the procedure well Dressing: 2 x 2 sterile gauze and Band-Aid    Post-procedure details: Patient was observed during the procedure. Post-procedure instructions were reviewed.  Patient left the clinic in stable condition.

## 2019-08-17 NOTE — Telephone Encounter (Signed)
Need to find out if she got any relief from the last injection at all.  Depending on relief is can determine medication management and what to do next.  She is scheduled for follow-up possible second injection.

## 2019-08-17 NOTE — Progress Notes (Signed)
Margaret [name] - 58 y.o. female MRN [id number]  Date of birth: May 16, 1961  Office Visit Note: Visit Date: 08/03/2019 PCP: Mina Marble, PA-C Referred by: Mina Marble, PA-C  Subjective: No chief complaint on file.  HPI: Margaret [name] is a 58 y.o. female who comes in today For planned left L4 transforaminal epidural steroid injection as well as MRI review.  Prior injection at L5-S1 did not really help much this was from an interlaminar approach.  She reports severe left radicular pain to the top of the foot more of an L5 distribution but somewhat L4 distribution.  MRI did show foraminal disc herniation and protrusion is worsened since the last MRI.  She has got relief in the past of her back pain with ablations and those have helped to continue to help.  She is requiring hydrocodone and this seems to help somewhat.  She has been chronically on tramadol which I have been providing for her and she has had no aberrant use.  She is also had some issues unfortunately in her personal life with her husband passing away.  She does have a history of depression and anxiety.  We will refill her hydrocodone today and complete L4 transforaminal injection to see if that does better.  If she does not get much relief at all with that and it looks like a good injection that I think she is coming to see a Licensed conveyancer.  Her case is complicated by obesity.  We obviously cannot keep her on chronic opioid medications and this would need to be talked about in the future.  We do have her on Lyrica as well.  Review of Systems  Constitutional: Negative for chills, fever, malaise/fatigue and weight loss.  HENT: Negative for hearing loss and sinus pain.   Eyes: Negative for blurred vision, double vision and photophobia.  Respiratory: Negative for cough and shortness of breath.   Cardiovascular: Negative for chest pain, palpitations and leg swelling.  Gastrointestinal: Negative for abdominal pain, nausea and  vomiting.  Genitourinary: Negative for flank pain.  Musculoskeletal: Positive for back pain. Negative for myalgias.  Skin: Negative for itching and rash.  Neurological: Positive for tingling. Negative for tremors, focal weakness and weakness.  Endo/Heme/Allergies: Negative.   Psychiatric/Behavioral: Negative for depression.  All other systems reviewed and are negative.  Otherwise per HPI.  Assessment & Plan: Visit Diagnoses:  1. Lumbar radiculopathy   2. Radiculopathy due to lumbar intervertebral disc disorder     Plan: Findings:  Plan today is diagnostic of a therapeutic left L4 transforaminal epidural steroid injection.  Her symptoms are somewhat L5 symptoms which is interesting for a foraminal protrusion but may be impacting the lateral recess to a degree we can see on the MRI.  Depending on relief she may need to see a spine surgeon for decompression of this area which is complicated by her body habitus.  We will see how she does with injection if she does pretty well we will repeat the injection 1 time.  Left to get her off of the hydrocodone and back to hopefully more of a chronic tramadol usage.    Meds & Orders:  Meds ordered this encounter  Medications  . betamethasone acetate-betamethasone sodium phosphate (CELESTONE) injection 12 mg  . HYDROcodone-acetaminophen (NORCO/VICODIN) 5-325 MG tablet    Sig: Take 1 tablet by mouth every 8 (eight) hours as needed for up to 5 days for moderate pain or severe pain.    Dispense:  15 tablet    Refill:  0    Orders Placed This Encounter  Procedures  . XR C-ARM NO REPORT  . Epidural Steroid injection    Follow-up: Return in about 2 weeks (around 08/17/2019) for Possible second injection.   Procedures: No procedures performed  Lumbosacral Transforaminal Epidural Steroid Injection - Sub-Pedicular Approach with Fluoroscopic Guidance  Patient: AMBRI [name]      Date of Birth: [birth year] MRN: [id number] PCP: Brayton El, PA-C       Visit Date: 08/03/2019   Universal Protocol:    Date/Time: 08/03/2019  Consent Given By: the patient  Position: PRONE  Additional Comments: Vital signs were monitored before and after the procedure. Patient was prepped and draped in the usual sterile fashion. The correct patient, procedure, and site was verified.   Injection Procedure Details:  Procedure Site One Meds Administered:  Meds ordered this encounter  Medications  . betamethasone acetate-betamethasone sodium phosphate (CELESTONE) injection 12 mg  . HYDROcodone-acetaminophen (NORCO/VICODIN) 5-325 MG tablet    Sig: Take 1 tablet by mouth every 8 (eight) hours as needed for up to 5 days for moderate pain or severe pain.    Dispense:  15 tablet    Refill:  0    Laterality: Left  Location/Site:  L4-L5  Needle size: 22 G  Needle type: Spinal  Needle Placement: Transforaminal  Findings:    -Comments: Excellent flow of contrast along the nerve and into the epidural space.  Procedure Details: After squaring off the end-plates to get a true AP view, the C-arm was positioned so that an oblique view of the foramen as noted above was visualized. The target area is just inferior to the "nose of the scotty dog" or sub pedicular. The soft tissues overlying this structure were infiltrated with 2-3 ml. of 1% Lidocaine without Epinephrine.  The spinal needle was inserted toward the target using a "trajectory" view along the fluoroscope beam.  Under AP and lateral visualization, the needle was advanced so it did not puncture dura and was located close the 6 O'Clock position of the pedical in AP tracterory. Biplanar projections were used to confirm position. Aspiration was confirmed to be negative for CSF and/or blood. A 1-2 ml. volume of Isovue-250 was injected and flow of contrast was noted at each level. Radiographs were obtained for documentation purposes.   After attaining the desired flow of contrast documented above, a  0.5 to 1.0 ml test dose of 0.25% Marcaine was injected into each respective transforaminal space.  The patient was observed for 90 seconds post injection.  After no sensory deficits were reported, and normal lower extremity motor function was noted,   the above injectate was administered so that equal amounts of the injectate were placed at each foramen (level) into the transforaminal epidural space.   Additional Comments:  The patient tolerated the procedure well Dressing: 2 x 2 sterile gauze and Band-Aid    Post-procedure details: Patient was observed during the procedure. Post-procedure instructions were reviewed.  Patient left the clinic in stable condition.    Clinical History: MRI LUMBAR SPINE WITHOUT CONTRAST  TECHNIQUE: Multiplanar, multisequence MR imaging of the lumbar spine was performed. No intravenous contrast was administered.  COMPARISON:  MRI lumbar spine 03/17/2017.  FINDINGS: Segmentation:  Standard.  Alignment:  Trace anterolisthesis L4 on L5 is unchanged.  Vertebrae:  No fracture, evidence of discitis, or bone lesion.  Conus medullaris and cauda equina: Conus extends to the T12-L1 level. Conus and cauda equina  appear normal.  Paraspinal and other soft tissues: Negative.  Disc levels:  T11-12 and T12-L1 are imaged in the sagittal plane only. There is loss of disc space height and a shallow bulge at T11-12 without stenosis. T12-L1 is negative. These levels are unchanged.  L1-2: Loss of disc space height and a shallow bulge without stenosis, unchanged.  L2-3: Mild facet degenerative disease.  Otherwise negative.  L3-4: Loss of disc space height with a shallow bulge, mild facet degenerative change and ligamentum flavum thickening. No central canal stenosis. Mild left foraminal narrowing is unchanged. The right foramen open.  L4-5: Facet degenerative disease. There is a shallow disc bulge and a protrusion in the left foramen which is  increased in size since the prior exam. The protrusion causes marked left foraminal narrowing and impinges on the exiting left L4 root. There is mild right foraminal narrowing. The central canal is open.  L5-S1: Facet degenerative change and a shallow disc bulge without central canal stenosis. Mild to moderate bilateral foraminal narrowing is unchanged.  IMPRESSION: Protrusion in the left foramen at L4-5 has increased since the prior examination and impinges on the exiting left L4 root. The appearance of the lumbar spine is otherwise unchanged.   Electronically Signed   By: Drusilla Kanner M.D.   On: 07/27/2019 11:23   She reports that she quit smoking about 11 years ago. Her smoking use included cigarettes. She has a 25.00 pack-year smoking history. She has never used smokeless tobacco. No results for input(s): HGBA1C, LABURIC in the last 8760 hours.  Objective:  VS:  HT:    WT:   BMI:     BP:(!) 163/77  HR:61bpm  TEMP: ( )  RESP:  Physical Exam Vitals signs and nursing note reviewed.  Constitutional:      General: She is not in acute distress.    Appearance: Normal appearance. She is well-developed. She is obese. She is not ill-appearing.  HENT:     Head: Normocephalic and atraumatic.     Right Ear: External ear normal.     Left Ear: External ear normal.     Nose: Nose normal.     Mouth/Throat:     Mouth: Mucous membranes are moist.     Pharynx: Oropharynx is clear.  Eyes:     Extraocular Movements: Extraocular movements intact.     Conjunctiva/sclera: Conjunctivae normal.     Pupils: Pupils are equal, round, and reactive to light.  Neck:     Musculoskeletal: Normal range of motion and neck supple.  Cardiovascular:     Rate and Rhythm: Normal rate and regular rhythm.     Pulses: Normal pulses.  Pulmonary:     Effort: Pulmonary effort is normal. No respiratory distress.  Abdominal:     General: There is no distension.     Palpations: Abdomen is soft.      Tenderness: There is no guarding.  Musculoskeletal:     Right lower leg: No edema.     Left lower leg: No edema.     Comments: Patient has good distal strength with no pain over the greater trochanters.  No clonus or focal weakness.  Skin:    General: Skin is warm and dry.     Findings: No erythema, lesion or rash.  Neurological:     General: No focal deficit present.     Mental Status: She is alert and oriented to person, place, and time.     Sensory: Sensory deficit present.  Motor: No weakness or abnormal muscle tone.     Coordination: Coordination normal.     Gait: Gait normal.  Psychiatric:        Mood and Affect: Mood normal.        Behavior: Behavior normal.        Thought Content: Thought content normal.     Ortho Exam Imaging: No results found.  Past Medical/Family/Surgical/Social History: Medications & Allergies reviewed per EMR, new medications updated. Patient Active Problem List   Diagnosis Date Noted  . Primary osteoarthritis of both hands 06/14/2017  . Status post total knee replacement, bilateral 05/17/2017  . DDD (degenerative disc disease), lumbar 05/17/2017  . Backache 11/22/2013  . Leukocytosis 03/25/2013  . Abnormal electrocardiogram 03/25/2013  . Failed total right knee replacement (HCC) 11/10/2012  . Obesity, Class III, BMI 40-49.9 (morbid obesity) (HCC) 11/10/2012  . Endometrial polyp 09/23/2012    Class: Present on Admission  . Hot flushes, perimenopausal 08/03/2012  . Menorrhagia 07/21/2012  . Menopause 07/13/2012  . Other and unspecified hyperlipidemia 07/13/2012  . Bereavement 07/13/2012  . Essential hypertension, benign 07/13/2012  . History of migraine 07/13/2012  . Hypothyroidism 07/13/2012   Past Medical History:  Diagnosis Date  . Anxiety   . Arthritis    knees, lower back, hands  . Depression   . Hyperlipidemia   . Hypertension    on meds, reviewed by Dr. Jacinto HalimGanji for cardiac clearance - 09/2013, told that she was cleared & no  need to follow up with him   . Hypothyroidism   . Mental disorder    perimenopause use of Paxil  . SVD (spontaneous vaginal delivery)    x 3  . Thyroid disease   . Total knee replacement status   . Wears dentures    upper and lower   Family History  Problem Relation Age of Onset  . Hyperlipidemia Mother   . Hypertension Mother   . Heart disease Mother   . Kidney disease Mother   . Rheumatologic disease Mother   . Heart disease Father   . Cancer Father   . Heart disease Sister   . Hypertension Sister    Past Surgical History:  Procedure Laterality Date  . CARPAL TUNNEL RELEASE     2000, 2001, both hands  . DILATATION & CURETTAGE/HYSTEROSCOPY WITH MYOSURE N/A 05/25/2018   Procedure: DILATATION & CURETTAGE/HYSTEROSCOPY WITH MYOSURE;  Surgeon: Richardean ChimeraMcComb, John, MD;  Location: [facility];  Service: Gynecology;  Laterality: N/A;  . DILATATION & CURRETTAGE/HYSTEROSCOPY WITH RESECTOCOPE  09/23/2012   Procedure: DILATATION & CURETTAGE/HYSTEROSCOPY WITH RESECTOCOPE;  Surgeon: Juluis MireJohn S McComb, MD;  Location: [facility];  Service: Gynecology;  Laterality: N/A;  . HAND SURGERY Bilateral    carpel tunnel surgery- bilateral  . JOINT REPLACEMENT  2000, 2012,   knee replacement bilateral left-2000 & 2012, right-2002  . MULTIPLE TOOTH EXTRACTIONS     upper and lower dentures  . TONSILLECTOMY    . TOTAL KNEE REVISION  11/08/2012   Procedure: TOTAL KNEE REVISION;  Surgeon: Valeria BatmanPeter W [name], MD;  [facility];  Service: Orthopedics;  Laterality: Right;  Revision Right Total Knee Replacement   . TUBAL LIGATION    . VAGINAL BIRTH AFTER CESAREAN SECTION     83, 85,87 - all vaginal - no c/s per patient  . VAGINAL DELIVERY  83, 85, 87   x 3   Social History   Occupational History  . Not on file  Tobacco Use  . Smoking status: Former Smoker  Packs/day: 1.00    Years: 25.00    Pack years: 25.00    Types: Cigarettes    Quit date: 07/13/2008    Years since quitting: 11.1  . Smokeless tobacco: Never  Used  Substance and Sexual Activity  . Alcohol use: No  . Drug use: No  . Sexual activity: Yes    Birth control/protection: Post-menopausal

## 2019-08-18 ENCOUNTER — Other Ambulatory Visit: Payer: Self-pay | Admitting: Physical Medicine and Rehabilitation

## 2019-08-18 DIAGNOSIS — F320 Major depressive disorder, single episode, mild: Secondary | ICD-10-CM | POA: Diagnosis not present

## 2019-08-18 MED ORDER — HYDROCODONE-ACETAMINOPHEN 5-325 MG PO TABS: 1.0000 | ORAL_TABLET | Freq: Three times a day (TID) | ORAL | 0 refills | Status: DC | PRN

## 2019-08-18 NOTE — Telephone Encounter (Signed)
Called patient to advise. [facility] [address]

## 2019-08-18 NOTE — Telephone Encounter (Signed)
She should reduce lyrica to once per day for next 5 days then stop, we can consider going back to gabapentin. I will refill hydrocodone but please tell her this cannot be longterm we need to figure out a plan, I would repeat shot for sure

## 2019-08-18 NOTE — Progress Notes (Signed)
Refill hydrocodone short term, waitin gon injeciton, 30% relief with last. Has been in PT, uses NSAID. Some ankle swelling with Lyrica. Pt told to wean off - instructions given.

## 2019-08-18 NOTE — Telephone Encounter (Signed)
Ok, done, brief note written.

## 2019-08-21 ENCOUNTER — Encounter: Payer: PPO | Admitting: Physical Medicine and Rehabilitation

## 2019-08-24 ENCOUNTER — Encounter: Payer: PPO | Admitting: Physical Medicine and Rehabilitation

## 2019-08-25 ENCOUNTER — Telehealth: Payer: Self-pay | Admitting: Physical Medicine and Rehabilitation

## 2019-08-25 ENCOUNTER — Other Ambulatory Visit: Payer: Self-pay | Admitting: Physical Medicine and Rehabilitation

## 2019-08-25 NOTE — Telephone Encounter (Signed)
Called patient to advise. Patient would like a refill of Lyrics.

## 2019-08-25 NOTE — Telephone Encounter (Signed)
I think she can stay on the lyrica and go back up to BID, we can monitor the swelling when we see her in office. The swelling is not related to cardiac etc in terms of the Lyrica but also may not be from the lyrica - it may be from not moving and walking as much due to pain. I would encourage her to elevate legs when not walking - I mean really elevate, lke lying down. I will refill Lyrica.

## 2019-08-28 NOTE — Telephone Encounter (Signed)
Did this morning 

## 2019-08-28 NOTE — Telephone Encounter (Signed)
Please advise 

## 2019-08-29 ENCOUNTER — Encounter: Payer: Self-pay | Admitting: Physical Medicine and Rehabilitation

## 2019-08-29 ENCOUNTER — Ambulatory Visit: Payer: Self-pay

## 2019-08-29 ENCOUNTER — Ambulatory Visit (INDEPENDENT_AMBULATORY_CARE_PROVIDER_SITE_OTHER): Payer: PPO | Admitting: Physical Medicine and Rehabilitation

## 2019-08-29 ENCOUNTER — Other Ambulatory Visit: Payer: Self-pay

## 2019-08-29 VITALS — BP 131/67 | HR 72

## 2019-08-29 DIAGNOSIS — M5116 Intervertebral disc disorders with radiculopathy, lumbar region: Secondary | ICD-10-CM

## 2019-08-29 MED ORDER — BETAMETHASONE SOD PHOS & ACET 6 (3-3) MG/ML IJ SUSP
12.0000 mg | Freq: Once | INTRAMUSCULAR | Status: AC
  Administered 2019-08-29: 12 mg

## 2019-08-29 NOTE — Progress Notes (Signed)
 .  Numeric Pain Rating Scale and Functional Assessment Average Pain 6   In the last MONTH (on 0-10 scale) has pain interfered with the following?  1. General activity like being  able to carry out your everyday physical activities such as walking, climbing stairs, carrying groceries, or moving a chair?  Rating(7)   +Driver, -BT, -Dye Allergies.  

## 2019-08-31 ENCOUNTER — Telehealth: Payer: Self-pay | Admitting: Physical Medicine and Rehabilitation

## 2019-08-31 ENCOUNTER — Other Ambulatory Visit: Payer: Self-pay | Admitting: Physical Medicine and Rehabilitation

## 2019-08-31 MED ORDER — HYDROCODONE-ACETAMINOPHEN 5-325 MG PO TABS: 1.0000 | ORAL_TABLET | Freq: Two times a day (BID) | ORAL | 0 refills | Status: DC | PRN

## 2019-08-31 NOTE — Telephone Encounter (Signed)
Done, she need to call us in 1 week or so

## 2019-08-31 NOTE — Telephone Encounter (Signed)
Called patient to advise. She will call in a week to let us know how the last injection has helped.

## 2019-09-06 ENCOUNTER — Telehealth: Payer: Self-pay | Admitting: Physical Medicine and Rehabilitation

## 2019-09-06 DIAGNOSIS — M48061 Spinal stenosis, lumbar region without neurogenic claudication: Secondary | ICD-10-CM

## 2019-09-06 DIAGNOSIS — M5416 Radiculopathy, lumbar region: Secondary | ICD-10-CM

## 2019-09-06 DIAGNOSIS — M5116 Intervertebral disc disorders with radiculopathy, lumbar region: Secondary | ICD-10-CM

## 2019-09-06 NOTE — Telephone Encounter (Signed)
Just put in an urgent referral to neurosurgery for this patient.

## 2019-09-06 NOTE — Telephone Encounter (Signed)
She needs semi urgent referral to Dr. Louanne Skye vs Neurosurgery.

## 2019-09-12 DIAGNOSIS — M5126 Other intervertebral disc displacement, lumbar region: Secondary | ICD-10-CM | POA: Diagnosis not present

## 2019-09-12 NOTE — Telephone Encounter (Signed)
Pt is scheduled today 09/12/19 at 915am with Dr. Saintclair Halsted.

## 2019-09-18 DIAGNOSIS — F320 Major depressive disorder, single episode, mild: Secondary | ICD-10-CM | POA: Diagnosis not present

## 2019-09-19 ENCOUNTER — Telehealth: Payer: Self-pay | Admitting: Physical Medicine and Rehabilitation

## 2019-09-19 NOTE — Telephone Encounter (Signed)
Ok on the RFA, not sure of timing. Send me message back so I can do pain med

## 2019-09-19 NOTE — Telephone Encounter (Signed)
Refill request

## 2019-09-20 ENCOUNTER — Other Ambulatory Visit: Payer: Self-pay | Admitting: Physical Medicine and Rehabilitation

## 2019-09-20 MED ORDER — HYDROCODONE-ACETAMINOPHEN 5-325 MG PO TABS: 1.0000 | ORAL_TABLET | Freq: Two times a day (BID) | ORAL | 0 refills | Status: DC | PRN

## 2019-09-20 NOTE — Telephone Encounter (Signed)
Called patient to advise  °

## 2019-09-20 NOTE — Telephone Encounter (Signed)
done

## 2019-09-20 NOTE — Telephone Encounter (Signed)
Faxed pa to [phone], case is pending.

## 2019-09-25 ENCOUNTER — Other Ambulatory Visit: Payer: Self-pay | Admitting: Physical Medicine and Rehabilitation

## 2019-09-25 NOTE — Telephone Encounter (Signed)
Please Advise

## 2019-09-25 NOTE — Telephone Encounter (Signed)
Pt is scheduled for 10/18/2019. 

## 2019-10-09 ENCOUNTER — Other Ambulatory Visit: Payer: Self-pay | Admitting: Physical Medicine and Rehabilitation

## 2019-10-09 ENCOUNTER — Telehealth: Payer: Self-pay | Admitting: *Deleted

## 2019-10-09 DIAGNOSIS — M5416 Radiculopathy, lumbar region: Secondary | ICD-10-CM

## 2019-10-09 MED ORDER — HYDROCODONE-ACETAMINOPHEN 5-325 MG PO TABS: 1.0000 | ORAL_TABLET | Freq: Two times a day (BID) | ORAL | 0 refills | Status: DC | PRN

## 2019-10-09 NOTE — Telephone Encounter (Signed)
Did refill, doe she have surgery scheduled still. Has there been any change in pain.

## 2019-10-09 NOTE — Progress Notes (Signed)
Ok Norco Rx while waiting on spine surgery. PDMP checked.

## 2019-10-10 NOTE — Telephone Encounter (Signed)
Pt states she is scheduled for 11/10/2019 and that her pain has not decreased any.

## 2019-10-10 NOTE — Telephone Encounter (Signed)
Called pt and lvm #1 

## 2019-10-17 DIAGNOSIS — Z20828 Contact with and (suspected) exposure to other viral communicable diseases: Secondary | ICD-10-CM | POA: Diagnosis not present

## 2019-10-17 DIAGNOSIS — R0981 Nasal congestion: Secondary | ICD-10-CM | POA: Diagnosis not present

## 2019-10-18 ENCOUNTER — Other Ambulatory Visit: Payer: Self-pay

## 2019-10-18 ENCOUNTER — Ambulatory Visit: Payer: Self-pay

## 2019-10-18 ENCOUNTER — Encounter: Payer: Self-pay | Admitting: Physical Medicine and Rehabilitation

## 2019-10-18 ENCOUNTER — Ambulatory Visit (INDEPENDENT_AMBULATORY_CARE_PROVIDER_SITE_OTHER): Payer: PPO | Admitting: Physical Medicine and Rehabilitation

## 2019-10-18 VITALS — BP 151/78 | HR 66

## 2019-10-18 DIAGNOSIS — M47816 Spondylosis without myelopathy or radiculopathy, lumbar region: Secondary | ICD-10-CM

## 2019-10-18 MED ORDER — METHYLPREDNISOLONE ACETATE 80 MG/ML IJ SUSP: 40.0000 mg | Freq: Once | INTRAMUSCULAR | Status: DC

## 2019-10-18 NOTE — Progress Notes (Signed)
Pt states pain in the lower back mostly on the right side and radiates into the left leg all the way down. Pt states walking, bending, and increasing activities makes pain worse. Heat and ice helps with pain along with pain medication.   .Numeric Pain Rating Scale and Functional Assessment Average Pain 6   In the last MONTH (on 0-10 scale) has pain interfered with the following?  1. General activity like being  able to carry out your everyday physical activities such as walking, climbing stairs, carrying groceries, or moving a chair?  Rating(7)   +Driver, -BT, -Dye Allergies.

## 2019-10-18 NOTE — Procedures (Signed)
Lumbosacral Transforaminal Epidural Steroid Injection - Sub-Pedicular Approach with Fluoroscopic Guidance  Patient: Margaret [name]      Date of Birth: 17-Apr-1961 MRN: [id number] PCP: Mina Marble, PA-C      Visit Date: 08/29/2019   Universal Protocol:    Date/Time: 08/29/2019  Consent Given By: the patient  Position: PRONE  Additional Comments: Vital signs were monitored before and after the procedure. Patient was prepped and draped in the usual sterile fashion. The correct patient, procedure, and site was verified.   Injection Procedure Details:  Procedure Site One Meds Administered:  Meds ordered this encounter  Medications  . betamethasone acetate-betamethasone sodium phosphate (CELESTONE) injection 12 mg    Laterality: Left  Location/Site:  L4-L5  Needle size: 22 G  Needle type: Spinal  Needle Placement: Transforaminal  Findings:    -Comments: Excellent flow of contrast along the nerve and into the epidural space.  Procedure Details: After squaring off the end-plates to get a true AP view, the C-arm was positioned so that an oblique view of the foramen as noted above was visualized. The target area is just inferior to the "nose of the scotty dog" or sub pedicular. The soft tissues overlying this structure were infiltrated with 2-3 ml. of 1% Lidocaine without Epinephrine.  The spinal needle was inserted toward the target using a "trajectory" view along the fluoroscope beam.  Under AP and lateral visualization, the needle was advanced so it did not puncture dura and was located close the 6 O'Clock position of the pedical in AP tracterory. Biplanar projections were used to confirm position. Aspiration was confirmed to be negative for CSF and/or blood. A 1-2 ml. volume of Isovue-250 was injected and flow of contrast was noted at each level. Radiographs were obtained for documentation purposes.   After attaining the desired flow of contrast documented above, a 0.5  to 1.0 ml test dose of 0.25% Marcaine was injected into each respective transforaminal space.  The patient was observed for 90 seconds post injection.  After no sensory deficits were reported, and normal lower extremity motor function was noted,   the above injectate was administered so that equal amounts of the injectate were placed at each foramen (level) into the transforaminal epidural space.   Additional Comments:  The patient tolerated the procedure well Dressing: 2 x 2 sterile gauze and Band-Aid    Post-procedure details: Patient was observed during the procedure. Post-procedure instructions were reviewed.  Patient left the clinic in stable condition.

## 2019-10-18 NOTE — Progress Notes (Signed)
[name] - 58 y.o. female MRN [id number]  Date of birth: 1961-08-07  Office Visit Note: Visit Date: 08/29/2019 PCP: Mina Marble, PA-C Referred by: Mina Marble, PA-C  Subjective: Chief Complaint  Patient presents with  . Lower Back - Pain  . Left Leg - Pain  . Left Ankle - Pain   HPI:  Margaret [name] is a 58 y.o. female who comes in today For L4 transforaminal dural steroid injection for left radicular leg pain.  Last injection in October helped some degree but did not last very long.  She is having great deal of pain continue take Lyrica as well as hydrocodone.  Average pain 6 out of 10.  Please see our prior notes for the details justification.  ROS Otherwise per HPI.  Assessment & Plan: Visit Diagnoses:  1. Radiculopathy due to lumbar intervertebral disc disorder     Plan: No additional findings.   Meds & Orders:  Meds ordered this encounter  Medications  . betamethasone acetate-betamethasone sodium phosphate (CELESTONE) injection 12 mg    Orders Placed This Encounter  Procedures  . XR C-ARM NO REPORT  . Epidural Steroid injection    Follow-up: Return if symptoms worsen or fail to improve.   Procedures: No procedures performed  Lumbosacral Transforaminal Epidural Steroid Injection - Sub-Pedicular Approach with Fluoroscopic Guidance  Patient: Margaret BRADFIELD      Date of Birth: 04/25/1961 MRN: [id number] PCP: Mina Marble, PA-C      Visit Date: 08/29/2019   Universal Protocol:    Date/Time: 08/29/2019  Consent Given By: the patient  Position: PRONE  Additional Comments: Vital signs were monitored before and after the procedure. Patient was prepped and draped in the usual sterile fashion. The correct patient, procedure, and site was verified.   Injection Procedure Details:  Procedure Site One Meds Administered:  Meds ordered this encounter  Medications  . betamethasone acetate-betamethasone sodium phosphate (CELESTONE)  injection 12 mg    Laterality: Left  Location/Site:  L4-L5  Needle size: 22 G  Needle type: Spinal  Needle Placement: Transforaminal  Findings:    -Comments: Excellent flow of contrast along the nerve and into the epidural space.  Procedure Details: After squaring off the end-plates to get a true AP view, the C-arm was positioned so that an oblique view of the foramen as noted above was visualized. The target area is just inferior to the "nose of the scotty dog" or sub pedicular. The soft tissues overlying this structure were infiltrated with 2-3 ml. of 1% Lidocaine without Epinephrine.  The spinal needle was inserted toward the target using a "trajectory" view along the fluoroscope beam.  Under AP and lateral visualization, the needle was advanced so it did not puncture dura and was located close the 6 O'Clock position of the pedical in AP tracterory. Biplanar projections were used to confirm position. Aspiration was confirmed to be negative for CSF and/or blood. A 1-2 ml. volume of Isovue-250 was injected and flow of contrast was noted at each level. Radiographs were obtained for documentation purposes.   After attaining the desired flow of contrast documented above, a 0.5 to 1.0 ml test dose of 0.25% Marcaine was injected into each respective transforaminal space.  The patient was observed for 90 seconds post injection.  After no sensory deficits were reported, and normal lower extremity motor function was noted,   the above injectate was administered so that equal amounts of the injectate were placed at each foramen (  level) into the transforaminal epidural space.   Additional Comments:  The patient tolerated the procedure well Dressing: 2 x 2 sterile gauze and Band-Aid    Post-procedure details: Patient was observed during the procedure. Post-procedure instructions were reviewed.  Patient left the clinic in stable condition.     Clinical History: MRI LUMBAR SPINE WITHOUT  CONTRAST  TECHNIQUE: Multiplanar, multisequence MR imaging of the lumbar spine was performed. No intravenous contrast was administered.  COMPARISON:  MRI lumbar spine 03/17/2017.  FINDINGS: Segmentation:  Standard.  Alignment:  Trace anterolisthesis L4 on L5 is unchanged.  Vertebrae:  No fracture, evidence of discitis, or bone lesion.  Conus medullaris and cauda equina: Conus extends to the T12-L1 level. Conus and cauda equina appear normal.  Paraspinal and other soft tissues: Negative.  Disc levels:  T11-12 and T12-L1 are imaged in the sagittal plane only. There is loss of disc space height and a shallow bulge at T11-12 without stenosis. T12-L1 is negative. These levels are unchanged.  L1-2: Loss of disc space height and a shallow bulge without stenosis, unchanged.  L2-3: Mild facet degenerative disease.  Otherwise negative.  L3-4: Loss of disc space height with a shallow bulge, mild facet degenerative change and ligamentum flavum thickening. No central canal stenosis. Mild left foraminal narrowing is unchanged. The right foramen open.  L4-5: Facet degenerative disease. There is a shallow disc bulge and a protrusion in the left foramen which is increased in size since the prior exam. The protrusion causes marked left foraminal narrowing and impinges on the exiting left L4 root. There is mild right foraminal narrowing. The central canal is open.  L5-S1: Facet degenerative change and a shallow disc bulge without central canal stenosis. Mild to moderate bilateral foraminal narrowing is unchanged.  IMPRESSION: Protrusion in the left foramen at L4-5 has increased since the prior examination and impinges on the exiting left L4 root. The appearance of the lumbar spine is otherwise unchanged.   Electronically Signed   By: Drusilla Kanner M.D.   On: 07/27/2019 11:23     Objective:  VS:  HT:    WT:   BMI:     BP:131/67  HR:72bpm  TEMP: ( )   RESP:  Physical Exam  Ortho Exam Imaging: No results found.

## 2019-10-18 NOTE — Progress Notes (Signed)
Margaret [name] - 58 y.o. female MRN [id number]  Date of birth: 10-Aug-1961  Office Visit Note: Visit Date: 10/18/2019 PCP: Mina Marble, PA-C Referred by: Mina Marble, PA-C  Subjective: Chief Complaint  Patient presents with  . Lower Back - Pain  . Left Leg - Pain   HPI:  Margaret [name] is a 58 y.o. female who comes in today For planned repeat L4-5 and L5-S1 radiofrequency ablation.  The patient has had radiofrequency ablation of the lower 2 facet joints in the past and every time she has had really excellent relief for almost a year at a time.  Last ablation was in June so she did get more than 6 months of relief.  Her pain is axial right-sided low back pain.  She has a secondary issue on the left which is being treated through neurosurgery.  She has facet arthropathy on MRI pretty severe.  She has been recalcitrant to any other form of treatment.  She is on some opioid medication at this point.  She has had physical therapy she has had activity modification.  This is a repeat injection with prior ablation giving her more than 50% relief for over 6 months.  ROS Otherwise per HPI.  Assessment & Plan: Visit Diagnoses:  1. Spondylosis without myelopathy or radiculopathy, lumbar region     Plan: No additional findings.   Meds & Orders:  Meds ordered this encounter  Medications  . methylPREDNISolone acetate (DEPO-MEDROL) injection 40 mg    Orders Placed This Encounter  Procedures  . Radiofrequency,Lumbar  . XR C-ARM NO REPORT    Follow-up: Return if symptoms worsen or fail to improve.   Procedures: No procedures performed  Lumbar Facet Joint Nerve Denervation  Patient: Margaret [name]      Date of Birth: [birth year] MRN: [id number] PCP: Mina Marble, PA-C      Visit Date: 10/18/2019   Universal Protocol:    Date/Time: 06/15/216:19 AM  Consent Given By: the patient  Position: PRONE  Additional Comments: Vital signs were monitored before and after the  procedure. Patient was prepped and draped in the usual sterile fashion. The correct patient, procedure, and site was verified.   Injection Procedure Details:  Procedure Site One Meds Administered:  Meds ordered this encounter  Medications  . methylPREDNISolone acetate (DEPO-MEDROL) injection 40 mg     Laterality: Right  Location/Site:  L4-L5 L5-S1  Needle size: 18 G  Needle type: Radiofrequency cannula  Needle Placement: Along juncture of superior articular process and transverse pocess  Findings:  -Comments:  Procedure Details: For each desired target nerve, the corresponding transverse process (sacral ala for the L5 dorsal rami) was identified and the fluoroscope was positioned to square off the endplates of the corresponding vertebral body to achieve a true AP midline view.  The beam was then obliqued 15 to 20 degrees and caudally tilted 15 to 20 degrees to line up a trajectory along the target nerves. The skin over the target of the junction of superior articulating process and transverse process (sacral ala for the L5 dorsal rami) was infiltrated with 61ml of 1% Lidocaine without Epinephrine.  The 18 gauge 76mm active tip outer cannula was advanced in trajectory view to the target.  This procedure was repeated for each target nerve.  Then, for all levels, the outer cannula placement was fine-tuned and the position was then confirmed with bi-planar imaging.    Test stimulation was done both at sensory and motor levels  to ensure there was no radicular stimulation. The target tissues were then infiltrated with 1 ml of 1% Lidocaine without Epinephrine. Subsequently, a percutaneous neurotomy was carried out for 90 seconds at 80 degrees Celsius.  After the completion of the lesion, 1 ml of injectate was delivered. It was then repeated for each facet joint nerve mentioned above. Appropriate radiographs were obtained to verify the probe placement during the neurotomy.   Additional  Comments:  The patient tolerated the procedure well Dressing: 2 x 2 sterile gauze and Band-Aid    Post-procedure details: Patient was observed during the procedure. Post-procedure instructions were reviewed.  Patient left the clinic in stable condition.       Clinical History: MRI LUMBAR SPINE WITHOUT CONTRAST  TECHNIQUE: Multiplanar, multisequence MR imaging of the lumbar spine was performed. No intravenous contrast was administered.  COMPARISON:  MRI lumbar spine 03/17/2017.  FINDINGS: Segmentation:  Standard.  Alignment:  Trace anterolisthesis L4 on L5 is unchanged.  Vertebrae:  No fracture, evidence of discitis, or bone lesion.  Conus medullaris and cauda equina: Conus extends to the T12-L1 level. Conus and cauda equina appear normal.  Paraspinal and other soft tissues: Negative.  Disc levels:  T11-12 and T12-L1 are imaged in the sagittal plane only. There is loss of disc space height and a shallow bulge at T11-12 without stenosis. T12-L1 is negative. These levels are unchanged.  L1-2: Loss of disc space height and a shallow bulge without stenosis, unchanged.  L2-3: Mild facet degenerative disease.  Otherwise negative.  L3-4: Loss of disc space height with a shallow bulge, mild facet degenerative change and ligamentum flavum thickening. No central canal stenosis. Mild left foraminal narrowing is unchanged. The right foramen open.  L4-5: Facet degenerative disease. There is a shallow disc bulge and a protrusion in the left foramen which is increased in size since the prior exam. The protrusion causes marked left foraminal narrowing and impinges on the exiting left L4 root. There is mild right foraminal narrowing. The central canal is open.  L5-S1: Facet degenerative change and a shallow disc bulge without central canal stenosis. Mild to moderate bilateral foraminal narrowing is unchanged.  IMPRESSION: Protrusion in the left foramen at  L4-5 has increased since the prior examination and impinges on the exiting left L4 root. The appearance of the lumbar spine is otherwise unchanged.   Electronically Signed   By: Drusilla Kannerhomas  Dalessio M.D.   On: 07/27/2019 11:23     Objective:  VS:  HT:    WT:   BMI:     BP:(!) 151/78  HR:66bpm  TEMP: ( )  RESP:  Physical Exam Constitutional:      General: She is not in acute distress.    Appearance: Normal appearance. She is not ill-appearing.  HENT:     Head: Normocephalic and atraumatic.     Right Ear: External ear normal.     Left Ear: External ear normal.  Eyes:     Extraocular Movements: Extraocular movements intact.  Cardiovascular:     Rate and Rhythm: Normal rate.     Pulses: Normal pulses.  Musculoskeletal:     Right lower leg: No edema.     Left lower leg: No edema.     Comments: Patient has good distal strength with no pain over the greater trochanters.  No clonus or focal weakness.  Concordant axial right-sided low back pain with facet joint loading  Skin:    Findings: No erythema, lesion or rash.  Neurological:  General: No focal deficit present.     Mental Status: She is alert and oriented to person, place, and time.     Sensory: No sensory deficit.     Motor: No weakness or abnormal muscle tone.     Coordination: Coordination normal.  Psychiatric:        Mood and Affect: Mood normal.        Behavior: Behavior normal.     Ortho Exam Imaging: No results found.

## 2019-10-19 DIAGNOSIS — F320 Major depressive disorder, single episode, mild: Secondary | ICD-10-CM | POA: Diagnosis not present

## 2019-10-25 ENCOUNTER — Other Ambulatory Visit: Payer: Self-pay | Admitting: Physical Medicine and Rehabilitation

## 2019-10-25 NOTE — Telephone Encounter (Signed)
Please advise 

## 2019-11-03 ENCOUNTER — Telehealth: Payer: Self-pay | Admitting: Physical Medicine and Rehabilitation

## 2019-11-03 ENCOUNTER — Other Ambulatory Visit: Payer: Self-pay | Admitting: Physical Medicine and Rehabilitation

## 2019-11-03 DIAGNOSIS — Z01818 Encounter for other preprocedural examination: Secondary | ICD-10-CM | POA: Diagnosis not present

## 2019-11-03 MED ORDER — HYDROCODONE-ACETAMINOPHEN 5-325 MG PO TABS: 1.0000 | ORAL_TABLET | Freq: Two times a day (BID) | ORAL | 0 refills | Status: AC | PRN

## 2019-11-07 ENCOUNTER — Encounter: Payer: Self-pay | Admitting: Physical Medicine and Rehabilitation

## 2019-11-08 NOTE — Telephone Encounter (Signed)
Rx sent. Closing encounter.

## 2019-11-10 DIAGNOSIS — M5126 Other intervertebral disc displacement, lumbar region: Secondary | ICD-10-CM | POA: Diagnosis not present

## 2019-11-10 DIAGNOSIS — M5116 Intervertebral disc disorders with radiculopathy, lumbar region: Secondary | ICD-10-CM | POA: Diagnosis not present

## 2019-11-24 ENCOUNTER — Other Ambulatory Visit: Payer: Self-pay | Admitting: Physical Medicine and Rehabilitation

## 2019-11-27 NOTE — Telephone Encounter (Signed)
Please advise 

## 2019-12-18 ENCOUNTER — Other Ambulatory Visit: Payer: Self-pay | Admitting: Neurosurgery

## 2019-12-18 DIAGNOSIS — M5126 Other intervertebral disc displacement, lumbar region: Secondary | ICD-10-CM

## 2019-12-25 ENCOUNTER — Other Ambulatory Visit: Payer: Self-pay | Admitting: Physical Medicine and Rehabilitation

## 2019-12-25 NOTE — Telephone Encounter (Signed)
Please Advise

## 2020-01-02 DIAGNOSIS — Z1231 Encounter for screening mammogram for malignant neoplasm of breast: Secondary | ICD-10-CM | POA: Diagnosis not present

## 2020-01-02 DIAGNOSIS — Z124 Encounter for screening for malignant neoplasm of cervix: Secondary | ICD-10-CM | POA: Diagnosis not present

## 2020-01-02 DIAGNOSIS — N3941 Urge incontinence: Secondary | ICD-10-CM | POA: Diagnosis not present

## 2020-01-02 DIAGNOSIS — N951 Menopausal and female climacteric states: Secondary | ICD-10-CM | POA: Diagnosis not present

## 2020-01-02 DIAGNOSIS — Z6841 Body Mass Index (BMI) 40.0 and over, adult: Secondary | ICD-10-CM | POA: Diagnosis not present

## 2020-01-05 ENCOUNTER — Encounter: Payer: Self-pay | Admitting: Gastroenterology

## 2020-01-09 DIAGNOSIS — N958 Other specified menopausal and perimenopausal disorders: Secondary | ICD-10-CM | POA: Diagnosis not present

## 2020-01-11 ENCOUNTER — Other Ambulatory Visit: Payer: Self-pay | Admitting: Obstetrics and Gynecology

## 2020-01-11 DIAGNOSIS — Z9189 Other specified personal risk factors, not elsewhere classified: Secondary | ICD-10-CM

## 2020-01-12 ENCOUNTER — Ambulatory Visit
Admission: RE | Admit: 2020-01-12 | Discharge: 2020-01-12 | Disposition: A | Discharge status: Home / Self Care | Payer: PPO | Source: Ambulatory Visit | Attending: Neurosurgery | Admitting: Neurosurgery

## 2020-01-12 DIAGNOSIS — M5126 Other intervertebral disc displacement, lumbar region: Secondary | ICD-10-CM | POA: Diagnosis not present

## 2020-01-12 DIAGNOSIS — M48061 Spinal stenosis, lumbar region without neurogenic claudication: Secondary | ICD-10-CM | POA: Diagnosis not present

## 2020-01-12 DIAGNOSIS — M47812 Spondylosis without myelopathy or radiculopathy, cervical region: Secondary | ICD-10-CM | POA: Diagnosis not present

## 2020-01-12 MED ORDER — GADOBENATE DIMEGLUMINE 529 MG/ML IV SOLN
20.0000 mL | Freq: Once | INTRAVENOUS | Status: AC | PRN
  Administered 2020-01-12: 20 mL via INTRAVENOUS

## 2020-01-25 ENCOUNTER — Other Ambulatory Visit: Payer: Self-pay | Admitting: Physical Medicine and Rehabilitation

## 2020-01-25 MED ORDER — PREGABALIN 75 MG PO CAPS: 75.0000 mg | ORAL_CAPSULE | Freq: Two times a day (BID) | ORAL | 2 refills | Status: DC

## 2020-01-25 NOTE — Telephone Encounter (Signed)
Please advise 

## 2020-01-30 ENCOUNTER — Telehealth: Payer: Self-pay

## 2020-01-30 NOTE — Telephone Encounter (Signed)
Dr. Adela Lank  This pt is a referral for a direct colon.  In her work up on 01/02/20 her wt was 356 lbs with BMI of 56.3  Would you like an OV or a direct to [employer].

## 2020-01-31 NOTE — Telephone Encounter (Signed)
Contacted pt- OV scheduled for 02/02/20 at 2:00.

## 2020-01-31 NOTE — Telephone Encounter (Signed)
Office visit would be preferred. Thanks

## 2020-02-02 ENCOUNTER — Ambulatory Visit: Payer: PPO | Admitting: Physician Assistant

## 2020-02-13 DIAGNOSIS — M5416 Radiculopathy, lumbar region: Secondary | ICD-10-CM | POA: Diagnosis not present

## 2020-02-13 DIAGNOSIS — M5126 Other intervertebral disc displacement, lumbar region: Secondary | ICD-10-CM | POA: Diagnosis not present

## 2020-02-13 DIAGNOSIS — Z6841 Body Mass Index (BMI) 40.0 and over, adult: Secondary | ICD-10-CM | POA: Diagnosis not present

## 2020-02-13 DIAGNOSIS — M48061 Spinal stenosis, lumbar region without neurogenic claudication: Secondary | ICD-10-CM | POA: Diagnosis not present

## 2020-02-14 ENCOUNTER — Ambulatory Visit: Payer: PPO | Admitting: Physician Assistant

## 2020-02-16 DIAGNOSIS — Z634 Disappearance and death of family member: Secondary | ICD-10-CM | POA: Diagnosis not present

## 2020-02-16 DIAGNOSIS — F320 Major depressive disorder, single episode, mild: Secondary | ICD-10-CM | POA: Diagnosis not present

## 2020-02-19 ENCOUNTER — Encounter: Payer: PPO | Admitting: Gastroenterology

## 2020-02-20 DIAGNOSIS — Z6841 Body Mass Index (BMI) 40.0 and over, adult: Secondary | ICD-10-CM | POA: Diagnosis not present

## 2020-02-20 DIAGNOSIS — F320 Major depressive disorder, single episode, mild: Secondary | ICD-10-CM | POA: Diagnosis not present

## 2020-02-20 DIAGNOSIS — I10 Essential (primary) hypertension: Secondary | ICD-10-CM | POA: Diagnosis not present

## 2020-02-20 DIAGNOSIS — R7309 Other abnormal glucose: Secondary | ICD-10-CM | POA: Diagnosis not present

## 2020-02-20 DIAGNOSIS — R5383 Other fatigue: Secondary | ICD-10-CM | POA: Diagnosis not present

## 2020-02-20 DIAGNOSIS — E039 Hypothyroidism, unspecified: Secondary | ICD-10-CM | POA: Diagnosis not present

## 2020-02-20 DIAGNOSIS — Z1321 Encounter for screening for nutritional disorder: Secondary | ICD-10-CM | POA: Diagnosis not present

## 2020-02-20 DIAGNOSIS — N3281 Overactive bladder: Secondary | ICD-10-CM | POA: Insufficient documentation

## 2020-02-20 DIAGNOSIS — E785 Hyperlipidemia, unspecified: Secondary | ICD-10-CM | POA: Diagnosis not present

## 2020-03-05 DIAGNOSIS — M48061 Spinal stenosis, lumbar region without neurogenic claudication: Secondary | ICD-10-CM | POA: Diagnosis not present

## 2020-03-05 DIAGNOSIS — M5416 Radiculopathy, lumbar region: Secondary | ICD-10-CM | POA: Diagnosis not present

## 2020-03-15 ENCOUNTER — Encounter: Payer: PPO | Admitting: Gastroenterology

## 2020-03-15 DIAGNOSIS — F320 Major depressive disorder, single episode, mild: Secondary | ICD-10-CM | POA: Diagnosis not present

## 2020-04-02 NOTE — Procedures (Signed)
Lumbar Facet Joint Nerve Denervation  Patient: Margaret [name]      Date of Birth: 1961/07/10 MRN: [id number] PCP: Brayton El, PA-C      Visit Date: 10/18/2019   Universal Protocol:    Date/Time: 06/15/216:19 AM  Consent Given By: the patient  Position: PRONE  Additional Comments: Vital signs were monitored before and after the procedure. Patient was prepped and draped in the usual sterile fashion. The correct patient, procedure, and site was verified.   Injection Procedure Details:  Procedure Site One Meds Administered:  Meds ordered this encounter  Medications  . methylPREDNISolone acetate (DEPO-MEDROL) injection 40 mg     Laterality: Right  Location/Site:  L4-L5 L5-S1  Needle size: 18 G  Needle type: Radiofrequency cannula  Needle Placement: Along juncture of superior articular process and transverse pocess  Findings:  -Comments:  Procedure Details: For each desired target nerve, the corresponding transverse process (sacral ala for the L5 dorsal rami) was identified and the fluoroscope was positioned to square off the endplates of the corresponding vertebral body to achieve a true AP midline view.  The beam was then obliqued 15 to 20 degrees and caudally tilted 15 to 20 degrees to line up a trajectory along the target nerves. The skin over the target of the junction of superior articulating process and transverse process (sacral ala for the L5 dorsal rami) was infiltrated with 55ml of 1% Lidocaine without Epinephrine.  The 18 gauge 5mm active tip outer cannula was advanced in trajectory view to the target.  This procedure was repeated for each target nerve.  Then, for all levels, the outer cannula placement was fine-tuned and the position was then confirmed with bi-planar imaging.    Test stimulation was done both at sensory and motor levels to ensure there was no radicular stimulation. The target tissues were then infiltrated with 1 ml of 1% Lidocaine without  Epinephrine. Subsequently, a percutaneous neurotomy was carried out for 90 seconds at 80 degrees Celsius.  After the completion of the lesion, 1 ml of injectate was delivered. It was then repeated for each facet joint nerve mentioned above. Appropriate radiographs were obtained to verify the probe placement during the neurotomy.   Additional Comments:  The patient tolerated the procedure well Dressing: 2 x 2 sterile gauze and Band-Aid    Post-procedure details: Patient was observed during the procedure. Post-procedure instructions were reviewed.  Patient left the clinic in stable condition.

## 2020-04-03 DIAGNOSIS — M48061 Spinal stenosis, lumbar region without neurogenic claudication: Secondary | ICD-10-CM | POA: Diagnosis not present

## 2020-04-03 DIAGNOSIS — M5416 Radiculopathy, lumbar region: Secondary | ICD-10-CM | POA: Diagnosis not present

## 2020-04-17 DIAGNOSIS — F320 Major depressive disorder, single episode, mild: Secondary | ICD-10-CM | POA: Diagnosis not present

## 2020-04-30 ENCOUNTER — Other Ambulatory Visit: Payer: Self-pay | Admitting: Physical Medicine and Rehabilitation

## 2020-04-30 NOTE — Telephone Encounter (Signed)
Please advise 

## 2020-05-14 DIAGNOSIS — M5416 Radiculopathy, lumbar region: Secondary | ICD-10-CM | POA: Diagnosis not present

## 2020-05-14 DIAGNOSIS — Z6841 Body Mass Index (BMI) 40.0 and over, adult: Secondary | ICD-10-CM | POA: Diagnosis not present

## 2020-05-14 DIAGNOSIS — I10 Essential (primary) hypertension: Secondary | ICD-10-CM | POA: Diagnosis not present

## 2020-05-14 DIAGNOSIS — M48061 Spinal stenosis, lumbar region without neurogenic claudication: Secondary | ICD-10-CM | POA: Diagnosis not present

## 2020-05-17 DIAGNOSIS — F320 Major depressive disorder, single episode, mild: Secondary | ICD-10-CM | POA: Diagnosis not present

## 2020-05-17 DIAGNOSIS — Z634 Disappearance and death of family member: Secondary | ICD-10-CM | POA: Diagnosis not present

## 2020-06-04 DIAGNOSIS — M5416 Radiculopathy, lumbar region: Secondary | ICD-10-CM | POA: Diagnosis not present

## 2020-06-04 DIAGNOSIS — Z6841 Body Mass Index (BMI) 40.0 and over, adult: Secondary | ICD-10-CM | POA: Insufficient documentation

## 2020-06-05 ENCOUNTER — Other Ambulatory Visit: Payer: Self-pay | Admitting: Student

## 2020-06-05 DIAGNOSIS — M5416 Radiculopathy, lumbar region: Secondary | ICD-10-CM

## 2020-06-12 DIAGNOSIS — R600 Localized edema: Secondary | ICD-10-CM | POA: Diagnosis not present

## 2020-06-12 DIAGNOSIS — R0600 Dyspnea, unspecified: Secondary | ICD-10-CM | POA: Diagnosis not present

## 2020-06-12 DIAGNOSIS — R05 Cough: Secondary | ICD-10-CM | POA: Diagnosis not present

## 2020-06-12 DIAGNOSIS — Z6841 Body Mass Index (BMI) 40.0 and over, adult: Secondary | ICD-10-CM | POA: Diagnosis not present

## 2020-06-12 DIAGNOSIS — E039 Hypothyroidism, unspecified: Secondary | ICD-10-CM | POA: Diagnosis not present

## 2020-06-12 DIAGNOSIS — R635 Abnormal weight gain: Secondary | ICD-10-CM | POA: Diagnosis not present

## 2020-06-14 DIAGNOSIS — R0600 Dyspnea, unspecified: Secondary | ICD-10-CM | POA: Diagnosis not present

## 2020-06-14 DIAGNOSIS — R635 Abnormal weight gain: Secondary | ICD-10-CM | POA: Diagnosis not present

## 2020-06-14 DIAGNOSIS — I700 Atherosclerosis of aorta: Secondary | ICD-10-CM | POA: Diagnosis not present

## 2020-06-14 DIAGNOSIS — R600 Localized edema: Secondary | ICD-10-CM | POA: Diagnosis not present

## 2020-06-14 DIAGNOSIS — R0602 Shortness of breath: Secondary | ICD-10-CM | POA: Diagnosis not present

## 2020-06-14 DIAGNOSIS — I2510 Atherosclerotic heart disease of native coronary artery without angina pectoris: Secondary | ICD-10-CM | POA: Diagnosis not present

## 2020-06-14 DIAGNOSIS — R0609 Other forms of dyspnea: Secondary | ICD-10-CM | POA: Diagnosis not present

## 2020-06-18 DIAGNOSIS — F320 Major depressive disorder, single episode, mild: Secondary | ICD-10-CM | POA: Diagnosis not present

## 2020-06-18 DIAGNOSIS — Z634 Disappearance and death of family member: Secondary | ICD-10-CM | POA: Diagnosis not present

## 2020-07-02 ENCOUNTER — Other Ambulatory Visit: Payer: PPO

## 2020-07-18 DIAGNOSIS — F320 Major depressive disorder, single episode, mild: Secondary | ICD-10-CM | POA: Diagnosis not present

## 2020-07-24 ENCOUNTER — Ambulatory Visit
Admission: RE | Admit: 2020-07-24 | Discharge: 2020-07-24 | Disposition: A | Discharge status: Home / Self Care | Payer: PPO | Source: Ambulatory Visit | Attending: Student | Admitting: Student

## 2020-07-24 DIAGNOSIS — M5416 Radiculopathy, lumbar region: Secondary | ICD-10-CM

## 2020-07-24 DIAGNOSIS — M5126 Other intervertebral disc displacement, lumbar region: Secondary | ICD-10-CM | POA: Diagnosis not present

## 2020-07-24 MED ORDER — GADOBENATE DIMEGLUMINE 529 MG/ML IV SOLN
20.0000 mL | Freq: Once | INTRAVENOUS | Status: AC | PRN
  Administered 2020-07-24: 20 mL via INTRAVENOUS

## 2020-07-30 DIAGNOSIS — M5126 Other intervertebral disc displacement, lumbar region: Secondary | ICD-10-CM | POA: Diagnosis not present

## 2020-08-01 ENCOUNTER — Other Ambulatory Visit: Payer: Self-pay | Admitting: Physical Medicine and Rehabilitation

## 2020-08-01 ENCOUNTER — Telehealth: Payer: Self-pay | Admitting: Physical Medicine and Rehabilitation

## 2020-08-01 NOTE — Telephone Encounter (Signed)
Please Advise

## 2020-08-01 NOTE — Telephone Encounter (Signed)
Pt called stating her pregabalin needs to be authorized before she can pick it up; pt would like a CB when this has been done  [phone]

## 2020-08-01 NOTE — Telephone Encounter (Signed)
Called patient to advise per rx request note.

## 2020-08-01 NOTE — Telephone Encounter (Signed)
Send back to you but also Toni Amend since she knows Margaret [name]. She had surgery by Dr. Wynetta Emery and I have not seen her since. Looks like she has seen Dr. Murray Hodgkins - he is pain/PM&R, like me but at Dr. Lonie Peak office. Please have her talk to him about refill of Lyrica.   If not seeing him at all then needs f/up with me or can get rx through her PCP

## 2020-08-07 ENCOUNTER — Other Ambulatory Visit: Payer: Self-pay | Admitting: Neurosurgery

## 2020-08-16 DIAGNOSIS — F320 Major depressive disorder, single episode, mild: Secondary | ICD-10-CM | POA: Diagnosis not present

## 2020-08-19 DIAGNOSIS — M48061 Spinal stenosis, lumbar region without neurogenic claudication: Secondary | ICD-10-CM | POA: Diagnosis not present

## 2020-08-19 DIAGNOSIS — F1120 Opioid dependence, uncomplicated: Secondary | ICD-10-CM | POA: Diagnosis not present

## 2020-08-19 DIAGNOSIS — Z79899 Other long term (current) drug therapy: Secondary | ICD-10-CM | POA: Insufficient documentation

## 2020-08-19 DIAGNOSIS — M5126 Other intervertebral disc displacement, lumbar region: Secondary | ICD-10-CM | POA: Diagnosis not present

## 2020-08-19 DIAGNOSIS — M5416 Radiculopathy, lumbar region: Secondary | ICD-10-CM | POA: Diagnosis not present

## 2020-08-20 DIAGNOSIS — R7309 Other abnormal glucose: Secondary | ICD-10-CM | POA: Diagnosis not present

## 2020-08-20 DIAGNOSIS — E559 Vitamin D deficiency, unspecified: Secondary | ICD-10-CM | POA: Diagnosis not present

## 2020-08-20 DIAGNOSIS — Z23 Encounter for immunization: Secondary | ICD-10-CM | POA: Diagnosis not present

## 2020-08-20 DIAGNOSIS — I10 Essential (primary) hypertension: Secondary | ICD-10-CM | POA: Diagnosis not present

## 2020-08-20 DIAGNOSIS — E785 Hyperlipidemia, unspecified: Secondary | ICD-10-CM | POA: Diagnosis not present

## 2020-08-20 DIAGNOSIS — R5383 Other fatigue: Secondary | ICD-10-CM | POA: Diagnosis not present

## 2020-08-20 DIAGNOSIS — E039 Hypothyroidism, unspecified: Secondary | ICD-10-CM | POA: Diagnosis not present

## 2020-08-23 DIAGNOSIS — E785 Hyperlipidemia, unspecified: Secondary | ICD-10-CM | POA: Diagnosis not present

## 2020-08-23 DIAGNOSIS — E039 Hypothyroidism, unspecified: Secondary | ICD-10-CM | POA: Diagnosis not present

## 2020-08-23 DIAGNOSIS — E559 Vitamin D deficiency, unspecified: Secondary | ICD-10-CM | POA: Diagnosis not present

## 2020-08-23 DIAGNOSIS — R7309 Other abnormal glucose: Secondary | ICD-10-CM | POA: Diagnosis not present

## 2020-08-23 DIAGNOSIS — R5383 Other fatigue: Secondary | ICD-10-CM | POA: Diagnosis not present

## 2020-08-23 DIAGNOSIS — I10 Essential (primary) hypertension: Secondary | ICD-10-CM | POA: Diagnosis not present

## 2020-09-11 DIAGNOSIS — E119 Type 2 diabetes mellitus without complications: Secondary | ICD-10-CM | POA: Insufficient documentation

## 2020-09-17 DIAGNOSIS — Z6841 Body Mass Index (BMI) 40.0 and over, adult: Secondary | ICD-10-CM | POA: Diagnosis not present

## 2020-09-17 DIAGNOSIS — Z79899 Other long term (current) drug therapy: Secondary | ICD-10-CM | POA: Diagnosis not present

## 2020-09-17 DIAGNOSIS — M48061 Spinal stenosis, lumbar region without neurogenic claudication: Secondary | ICD-10-CM | POA: Diagnosis not present

## 2020-09-17 DIAGNOSIS — F320 Major depressive disorder, single episode, mild: Secondary | ICD-10-CM | POA: Diagnosis not present

## 2020-09-17 DIAGNOSIS — Z634 Disappearance and death of family member: Secondary | ICD-10-CM | POA: Diagnosis not present

## 2020-09-17 DIAGNOSIS — F1120 Opioid dependence, uncomplicated: Secondary | ICD-10-CM | POA: Diagnosis not present

## 2020-09-17 NOTE — Pre-Procedure Instructions (Signed)
[id number] [place], [place] - [address] [place] [address] Phone: [phone] Fax: [phone]     Your procedure is scheduled on Wednesday, 09/25/20 from 08:30 AM- 11:46 AM.  Report to [facility] Main Entrance "A" at 06:30 A.M., and check in at the Admitting office.  Call this number if you have problems the morning of surgery:  [phone]  Call [phone] if you have any questions prior to your surgery date Monday-Friday 8am-4pm.    Remember:  Do not eat or drink after midnight the night before your surgery.    Take these medicines the morning of surgery with A SIP OF WATER:  buPROPion (WELLBUTRIN XL) estradiol (ESTRACE)  levothyroxine (SYNTHROID) metoprolol succinate (TOPROL-XL) pregabalin (LYRICA)  IF NEEDED: HYDROcodone-acetaminophen (NORCO/VICODIN) methocarbamol (ROBAXIN)   WHAT DO I DO ABOUT MY DIABETES MEDICATION? Marland Kitchen Do not take oral diabetes medicines (pills) , I.e. metFORMIN (GLUCOPHAGE-XR), the morning of surgery.   As of today, STOP taking any Aspirin (unless otherwise instructed by your surgeon), meloxicam (MOBIC), Aleve, Naproxen, Ibuprofen, Motrin, Advil, Goody's, BC's, all herbal medications, fish oil, and all vitamins.            HOW TO MANAGE YOUR DIABETES BEFORE AND AFTER SURGERY  Why is it important to control my blood sugar before and after surgery? . Improving blood sugar levels before and after surgery helps healing and can limit problems. . A way of improving blood sugar control is eating a healthy diet by: o  Eating less sugar and carbohydrates o  Increasing activity/exercise o  Talking with your doctor about reaching your blood sugar goals . High blood sugars (greater than 180 mg/dL) can raise your risk of infections and slow your recovery, so you will need to focus on controlling your diabetes during the weeks before surgery. . Make sure that the doctor who takes care of your diabetes knows about  your planned surgery including the date and location.  How do I manage my blood sugar before surgery? . Check your blood sugar at least 4 times a day, starting 2 days before surgery, to make sure that the level is not too high or low. . Check your blood sugar the morning of your surgery when you wake up and every 2 hours until you get to the Short Stay unit. o If your blood sugar is less than 70 mg/dL, you will need to treat for low blood sugar: - Treat a low blood sugar (less than 70 mg/dL) with  cup of clear juice (cranberry or apple), 4 glucose tablets, OR glucose gel. - Recheck blood sugar in 15 minutes after treatment (to make sure it is greater than 70 mg/dL). If your blood sugar is not greater than 70 mg/dL on recheck, call [phone] for further instructions. . Report your blood sugar to the short stay nurse when you get to Short Stay.  . If you are admitted to the hospital after surgery: o Your blood sugar will be checked by the staff and you will probably be given insulin after surgery (instead of oral diabetes medicines) to make sure you have good blood sugar levels. o The goal for blood sugar control after surgery is 80-180 mg/dL.    The Morning of Surgery:            Do not wear jewelry, make up, or nail polish.            Do not wear lotions, powders, perfumes, or deodorant.  Do not shave 48 hours prior to surgery.              Do not bring valuables to the hospital.            [facility] is not responsible for any belongings or valuables.  Do NOT Smoke (Tobacco/Vaping) or drink Alcohol 24 hours prior to your procedure.  If you use a CPAP at night, you may bring all equipment for your overnight stay.   Contacts, glasses, dentures or bridgework may not be worn into surgery.      For patients admitted to the hospital, discharge time will be determined by your treatment team.   Patients discharged the day of surgery will not be allowed to drive home, and  someone needs to stay with them for 24 hours.    Special instructions:   [facility]- Preparing For Surgery  Before surgery, you can play an important role. Because skin is not sterile, your skin needs to be as free of germs as possible. You can reduce the number of germs on your skin by washing with CHG (chlorahexidine gluconate) Soap before surgery.  CHG is an antiseptic cleaner which kills germs and bonds with the skin to continue killing germs even after washing.    Oral Hygiene is also important to reduce your risk of infection.  Remember - BRUSH YOUR TEETH THE MORNING OF SURGERY WITH YOUR REGULAR TOOTHPASTE  Please do not use if you have an allergy to CHG or antibacterial soaps. If your skin becomes reddened/irritated stop using the CHG.  Do not shave (including legs and underarms) for at least 48 hours prior to first CHG shower. It is OK to shave your face.  Please follow these instructions carefully.   1. Shower the NIGHT BEFORE SURGERY and the MORNING OF SURGERY with CHG Soap.   2. If you chose to wash your hair, wash your hair first as usual with your normal shampoo.  3. After you shampoo, rinse your hair and body thoroughly to remove the shampoo.  4. Use CHG as you would any other liquid soap. You can apply CHG directly to the skin and wash gently with a scrungie or a clean washcloth.   5. Apply the CHG Soap to your body ONLY FROM THE NECK DOWN.  Do not use on open wounds or open sores. Avoid contact with your eyes, ears, mouth and genitals (private parts). Wash Face and genitals (private parts)  with your normal soap.   6. Wash thoroughly, paying special attention to the area where your surgery will be performed.  7. Thoroughly rinse your body with warm water from the neck down.  8. DO NOT shower/wash with your normal soap after using and rinsing off the CHG Soap.  9. Pat yourself dry with a CLEAN TOWEL.  10. Wear CLEAN PAJAMAS to bed the night before  surgery  11. Place CLEAN SHEETS on your bed the night of your first shower and DO NOT SLEEP WITH PETS.   Day of Surgery: SHOWER Wear Clean/Comfortable clothing the morning of surgery Do not apply any deodorants/lotions.   Remember to brush your teeth WITH YOUR REGULAR TOOTHPASTE.   Please read over the following fact sheets that you were given.

## 2020-09-18 ENCOUNTER — Other Ambulatory Visit: Payer: Self-pay

## 2020-09-18 ENCOUNTER — Encounter (HOSPITAL_COMMUNITY)
Admission: RE | Admit: 2020-09-18 | Discharge: 2020-09-18 | Disposition: A | Discharge status: Home / Self Care | Payer: PPO | Source: Ambulatory Visit | Attending: Neurosurgery | Admitting: Neurosurgery

## 2020-09-18 ENCOUNTER — Encounter (HOSPITAL_COMMUNITY): Payer: Self-pay

## 2020-09-18 DIAGNOSIS — Z01818 Encounter for other preprocedural examination: Secondary | ICD-10-CM | POA: Diagnosis not present

## 2020-09-18 DIAGNOSIS — E119 Type 2 diabetes mellitus without complications: Secondary | ICD-10-CM | POA: Diagnosis not present

## 2020-09-18 DIAGNOSIS — I10 Essential (primary) hypertension: Secondary | ICD-10-CM | POA: Insufficient documentation

## 2020-09-18 HISTORY — DX: Type 2 diabetes mellitus without complications: E11.9

## 2020-09-18 LAB — CBC
HCT: 42 % (ref 36.0–46.0)
Hemoglobin: 13.2 g/dL (ref 12.0–15.0)
MCH: 29 pg (ref 26.0–34.0)
MCHC: 31.4 g/dL (ref 30.0–36.0)
MCV: 92.3 fL (ref 80.0–100.0)
Platelets: 313 10*3/uL (ref 150–400)
RBC: 4.55 MIL/uL (ref 3.87–5.11)
RDW: 13.2 % (ref 11.5–15.5)
WBC: 10.7 10*3/uL — ABNORMAL HIGH (ref 4.0–10.5)
nRBC: 0 % (ref 0.0–0.2)

## 2020-09-18 LAB — BASIC METABOLIC PANEL
Anion gap: 12 (ref 5–15)
BUN: 15 mg/dL (ref 6–20)
CO2: 28 mmol/L (ref 22–32)
Calcium: 10 mg/dL (ref 8.9–10.3)
Chloride: 99 mmol/L (ref 98–111)
Creatinine, Ser: 0.76 mg/dL (ref 0.44–1.00)
GFR, Estimated: >60 mL/min (ref >60)
Glucose, Bld: 173 mg/dL — ABNORMAL HIGH (ref 70–99)
Potassium: 4.7 mmol/L (ref 3.5–5.1)
Sodium: 139 mmol/L (ref 135–145)

## 2020-09-18 LAB — SURGICAL PCR SCREEN
MRSA, PCR: NEGATIVE
Staphylococcus aureus: NEGATIVE

## 2020-09-18 LAB — GLUCOSE, CAPILLARY: Glucose-Capillary: 181 mg/dL — ABNORMAL HIGH (ref 70–99)

## 2020-09-18 LAB — TYPE AND SCREEN
ABO/RH(D): A POS
Antibody Screen: NEGATIVE

## 2020-09-18 NOTE — Progress Notes (Signed)
PCP - Konrad Saha, PA Cardiologist - patient denies  PPM/ICD - n/a Device Orders -  Rep Notified -   Chest x-ray - n/a EKG - 09/18/2020 Stress Test - 06/14/2020 ECHO - patient denies Cardiac Cath - patient denies  Sleep Study - n/a CPAP -   Fasting Blood Sugar - patient is a newly diagnosed type 2 diabetic.  Started on Metformin and an injectable (but waiting to start injectable until after surgery).  A1c 7.9 on 08/27/2020 Checks Blood Sugar _____ times a day - patient does not yet have a CBG meter  Blood Thinner Instructions: n/a Aspirin Instructions: n/a  ERAS Protcol - n/a PRE-SURGERY Ensure or G2-   COVID TEST- Saturday 09/21/20 at 1105   Anesthesia review: yes, recent cardiac testing  Patient denies shortness of breath, fever, cough and chest pain at PAT appointment   All instructions explained to the patient, with a verbal understanding of the material. Patient agrees to go over the instructions while at home for a better understanding. Patient also instructed to self quarantine after being tested for COVID-19. The opportunity to ask questions was provided.

## 2020-09-20 NOTE — Progress Notes (Signed)
Anesthesia Chart Review:  I spoke with patient at preop appointment due to her abnormal EKG.  Tracing 09/18/2020 showed sinus rhythm, incomplete left lateral branch block, lateral ST and T wave abnormality..  ST and T changes new compared to previous tracing 05/18/2018.  She denies any cardiac history.  She reports that over the past year she has had roughly 70 pound weight gain (she has had difficulty with depression since her husband passed a year ago).  She has become quite deconditioned over this time.  She reports stable DOE.  No SOB at rest, denies chest pain.  Her weight gain has also worsened her glycemic control, A1c 7.9 on 08/23/2020.  On exam she is in no distress, breathing is unlabored, auscultation of the heart reveals regular rate and rhythm, no murmurs, lungs are clear bilaterally.  She has recently discussed her weight gain and DOE with her PCP who ordered a nuclear stress test which was done 06/14/2020 and showed normal perfusion, normal EF, low risk.  Preop labs reviewed, unremarkable.  Reviewed history with Dr. Jean Rosenthal.  She advised that because of patient's recent low risk, nonischemic stress test, she can proceed as planned barring acute status change or development of any new symptoms.  Nuclear stress 06/14/2020 (Care Everywhere): IMPRESSION:  1. No reversible ischemia or infarction.   2. Normal left ventricular wall motion.   3. Left ventricular ejection fraction 63%   4. Non invasive risk stratification*: Low    Zannie Cove [facility] Short Stay [facility]/Anesthesiology Phone [phone] 09/23/2020 1:00 PM

## 2020-09-21 ENCOUNTER — Other Ambulatory Visit (HOSPITAL_COMMUNITY)
Admission: RE | Admit: 2020-09-21 | Discharge: 2020-09-21 | Disposition: A | Discharge status: Home / Self Care | Payer: PPO | Source: Ambulatory Visit | Attending: Neurosurgery | Admitting: Neurosurgery

## 2020-09-21 DIAGNOSIS — Z20822 Contact with and (suspected) exposure to covid-19: Secondary | ICD-10-CM | POA: Insufficient documentation

## 2020-09-21 DIAGNOSIS — Z01812 Encounter for preprocedural laboratory examination: Secondary | ICD-10-CM | POA: Diagnosis not present

## 2020-09-22 LAB — SARS CORONAVIRUS 2 (TAT 6-24 HRS): SARS Coronavirus 2: NEGATIVE

## 2020-09-24 MED ORDER — DEXTROSE 5 % IV SOLN
3.0000 g | INTRAVENOUS | Status: DC
  Filled 2020-09-24: qty 3000

## 2020-09-24 NOTE — Anesthesia Preprocedure Evaluation (Addendum)
Anesthesia Evaluation  Patient identified by MRN, date of birth, ID band Patient awake    Reviewed: Allergy & Precautions, NPO status , Patient's Chart, lab work & pertinent test results  History of Anesthesia Complications Negative for: history of anesthetic complications  Airway Mallampati: II  TM Distance: >3 FB Neck ROM: Full    Dental  (+) Edentulous Upper, Edentulous Lower   Pulmonary former smoker,    Pulmonary exam normal        Cardiovascular hypertension, Pt. on home beta blockers and Pt. on medications Normal cardiovascular exam   '21 Nuc Stress - Care Everywhere: 1. No reversible ischemia or infarction.  2. Normal left ventricular wall motion.  3. Left ventricular ejection fraction 63%  4. Non invasive risk stratification*: Low     Neuro/Psych PSYCHIATRIC DISORDERS Anxiety Depression negative neurological ROS     GI/Hepatic negative GI ROS, Neg liver ROS,   Endo/Other  diabetes, Type 2, Oral Hypoglycemic AgentsHypothyroidism Morbid obesity  Renal/GU negative Renal ROS     Musculoskeletal  (+) Arthritis ,   Abdominal (+) + obese,   Peds  Hematology negative hematology ROS (+)   Anesthesia Other Findings Covid test negative   Reproductive/Obstetrics                            Anesthesia Physical Anesthesia Plan  ASA: III  Anesthesia Plan: General   Post-op Pain Management:    Induction: Intravenous  PONV Risk Score and Plan: 4 or greater and Treatment may vary due to age or medical condition, Ondansetron, Dexamethasone and Midazolam  Airway Management Planned: Oral ETT  Additional Equipment: None  Intra-op Plan:   Post-operative Plan: Extubation in OR  Informed Consent: I have reviewed the patients History and Physical, chart, labs and discussed the procedure including the risks, benefits and alternatives for the proposed anesthesia with the patient or  authorized representative who has indicated his/her understanding and acceptance.     Dental advisory given  Plan Discussed with: CRNA and Anesthesiologist  Anesthesia Plan Comments:        Anesthesia Quick Evaluation

## 2020-09-25 ENCOUNTER — Ambulatory Visit (HOSPITAL_COMMUNITY)
Admission: RE | Admit: 2020-09-25 | Discharge: 2020-09-26 | Disposition: A | Discharge status: Home / Self Care | Payer: PPO | Attending: Neurosurgery | Admitting: Neurosurgery

## 2020-09-25 ENCOUNTER — Encounter (HOSPITAL_COMMUNITY)
Admission: RE | Disposition: A | Discharge status: Home / Self Care | Payer: Self-pay | Source: Home / Self Care | Attending: Neurosurgery

## 2020-09-25 ENCOUNTER — Ambulatory Visit (HOSPITAL_COMMUNITY): Payer: PPO | Admitting: Certified Registered"

## 2020-09-25 ENCOUNTER — Ambulatory Visit (HOSPITAL_COMMUNITY): Payer: PPO | Admitting: Physician Assistant

## 2020-09-25 ENCOUNTER — Ambulatory Visit (HOSPITAL_COMMUNITY): Payer: PPO

## 2020-09-25 DIAGNOSIS — Z96653 Presence of artificial knee joint, bilateral: Secondary | ICD-10-CM | POA: Diagnosis not present

## 2020-09-25 DIAGNOSIS — I10 Essential (primary) hypertension: Secondary | ICD-10-CM | POA: Diagnosis not present

## 2020-09-25 DIAGNOSIS — Z79899 Other long term (current) drug therapy: Secondary | ICD-10-CM | POA: Diagnosis not present

## 2020-09-25 DIAGNOSIS — Z7984 Long term (current) use of oral hypoglycemic drugs: Secondary | ICD-10-CM | POA: Diagnosis not present

## 2020-09-25 DIAGNOSIS — M5116 Intervertebral disc disorders with radiculopathy, lumbar region: Secondary | ICD-10-CM | POA: Diagnosis not present

## 2020-09-25 DIAGNOSIS — Z8249 Family history of ischemic heart disease and other diseases of the circulatory system: Secondary | ICD-10-CM | POA: Insufficient documentation

## 2020-09-25 DIAGNOSIS — Z8261 Family history of arthritis: Secondary | ICD-10-CM | POA: Insufficient documentation

## 2020-09-25 DIAGNOSIS — E119 Type 2 diabetes mellitus without complications: Secondary | ICD-10-CM | POA: Insufficient documentation

## 2020-09-25 DIAGNOSIS — Z9889 Other specified postprocedural states: Secondary | ICD-10-CM | POA: Insufficient documentation

## 2020-09-25 DIAGNOSIS — Z809 Family history of malignant neoplasm, unspecified: Secondary | ICD-10-CM | POA: Insufficient documentation

## 2020-09-25 DIAGNOSIS — M48061 Spinal stenosis, lumbar region without neurogenic claudication: Secondary | ICD-10-CM | POA: Insufficient documentation

## 2020-09-25 DIAGNOSIS — Z841 Family history of disorders of kidney and ureter: Secondary | ICD-10-CM | POA: Diagnosis not present

## 2020-09-25 DIAGNOSIS — E039 Hypothyroidism, unspecified: Secondary | ICD-10-CM | POA: Diagnosis not present

## 2020-09-25 DIAGNOSIS — M4316 Spondylolisthesis, lumbar region: Secondary | ICD-10-CM | POA: Insufficient documentation

## 2020-09-25 DIAGNOSIS — M5126 Other intervertebral disc displacement, lumbar region: Secondary | ICD-10-CM | POA: Diagnosis not present

## 2020-09-25 DIAGNOSIS — Z419 Encounter for procedure for purposes other than remedying health state, unspecified: Secondary | ICD-10-CM

## 2020-09-25 DIAGNOSIS — Z885 Allergy status to narcotic agent status: Secondary | ICD-10-CM | POA: Insufficient documentation

## 2020-09-25 DIAGNOSIS — M4326 Fusion of spine, lumbar region: Secondary | ICD-10-CM | POA: Diagnosis not present

## 2020-09-25 LAB — GLUCOSE, CAPILLARY
Glucose-Capillary: 236 mg/dL — ABNORMAL HIGH (ref 70–99)
Glucose-Capillary: 255 mg/dL — ABNORMAL HIGH (ref 70–99)
Glucose-Capillary: 252 mg/dL — ABNORMAL HIGH (ref 70–99)
Glucose-Capillary: 184 mg/dL — ABNORMAL HIGH (ref 70–99)

## 2020-09-25 LAB — HEMOGLOBIN A1C
Hgb A1c MFr Bld: 8 % — ABNORMAL HIGH (ref 4.8–5.6)
Mean Plasma Glucose: 182.9 mg/dL

## 2020-09-25 SURGERY — POSTERIOR LUMBAR FUSION 1 LEVEL
Anesthesia: General | Site: Spine Lumbar

## 2020-09-25 MED ORDER — CEFAZOLIN SODIUM-DEXTROSE 2-4 GM/100ML-% IV SOLN
2.0000 g | Freq: Three times a day (TID) | INTRAVENOUS | Status: AC
  Administered 2020-09-25 (×2): 2 g via INTRAVENOUS
  Filled 2020-09-25 (×2): qty 100

## 2020-09-25 MED ORDER — PROMETHAZINE HCL 25 MG/ML IJ SOLN: 6.2500 mg | INTRAMUSCULAR | Status: DC | PRN

## 2020-09-25 MED ORDER — METFORMIN HCL ER 500 MG PO TB24
500.0000 mg | ORAL_TABLET | Freq: Every day | ORAL | Status: DC
  Administered 2020-09-26: 500 mg via ORAL
  Filled 2020-09-25: qty 1

## 2020-09-25 MED ORDER — ROCURONIUM BROMIDE 10 MG/ML (PF) SYRINGE
PREFILLED_SYRINGE | INTRAVENOUS | Status: DC | PRN
  Administered 2020-09-25: 10 mg via INTRAVENOUS
  Administered 2020-09-25: 70 mg via INTRAVENOUS
  Administered 2020-09-25 (×2): 30 mg via INTRAVENOUS

## 2020-09-25 MED ORDER — ONDANSETRON HCL 4 MG PO TABS: 4.0000 mg | ORAL_TABLET | Freq: Four times a day (QID) | ORAL | Status: DC | PRN

## 2020-09-25 MED ORDER — CHLORHEXIDINE GLUCONATE CLOTH 2 % EX PADS: 6.0000 | MEDICATED_PAD | Freq: Once | CUTANEOUS | Status: DC

## 2020-09-25 MED ORDER — OXYCODONE HCL 5 MG/5ML PO SOLN: 5.0000 mg | Freq: Once | ORAL | Status: DC | PRN

## 2020-09-25 MED ORDER — ACETAMINOPHEN 650 MG RE SUPP: 650.0000 mg | RECTAL | Status: DC | PRN

## 2020-09-25 MED ORDER — MIDAZOLAM HCL 2 MG/2ML IJ SOLN
INTRAMUSCULAR | Status: AC
  Filled 2020-09-25: qty 2

## 2020-09-25 MED ORDER — LIDOCAINE-EPINEPHRINE 1 %-1:100000 IJ SOLN
INTRAMUSCULAR | Status: AC
  Filled 2020-09-25: qty 1

## 2020-09-25 MED ORDER — ROCURONIUM BROMIDE 10 MG/ML (PF) SYRINGE
PREFILLED_SYRINGE | INTRAVENOUS | Status: AC
  Filled 2020-09-25: qty 10

## 2020-09-25 MED ORDER — METHOCARBAMOL 500 MG PO TABS
1000.0000 mg | ORAL_TABLET | Freq: Four times a day (QID) | ORAL | Status: DC | PRN
  Administered 2020-09-25 (×2): 1000 mg via ORAL
  Administered 2020-09-26: 500 mg via ORAL
  Filled 2020-09-25 (×3): qty 2

## 2020-09-25 MED ORDER — LIDOCAINE HCL (PF) 2 % IJ SOLN
INTRAMUSCULAR | Status: AC
  Filled 2020-09-25: qty 5

## 2020-09-25 MED ORDER — SUGAMMADEX SODIUM 500 MG/5ML IV SOLN
INTRAVENOUS | Status: DC | PRN
  Administered 2020-09-25: 400 mg via INTRAVENOUS

## 2020-09-25 MED ORDER — ALUM & MAG HYDROXIDE-SIMETH 200-200-20 MG/5ML PO SUSP: 30.0000 mL | Freq: Four times a day (QID) | ORAL | Status: DC | PRN

## 2020-09-25 MED ORDER — VITAMIN D 25 MCG (1000 UNIT) PO TABS
5000.0000 [IU] | ORAL_TABLET | Freq: Every day | ORAL | Status: DC
  Administered 2020-09-26: 5000 [IU] via ORAL
  Filled 2020-09-25 (×3): qty 5

## 2020-09-25 MED ORDER — DEXAMETHASONE SODIUM PHOSPHATE 10 MG/ML IJ SOLN
INTRAMUSCULAR | Status: AC
  Filled 2020-09-25: qty 1

## 2020-09-25 MED ORDER — INSULIN ASPART 100 UNIT/ML ~~LOC~~ SOLN
SUBCUTANEOUS | Status: AC
  Filled 2020-09-25: qty 1

## 2020-09-25 MED ORDER — PROGESTERONE MICRONIZED 100 MG PO CAPS: 100.0000 mg | ORAL_CAPSULE | Freq: Every day | ORAL | Status: DC

## 2020-09-25 MED ORDER — HYDROCODONE-ACETAMINOPHEN 5-325 MG PO TABS
1.0000 | ORAL_TABLET | Freq: Four times a day (QID) | ORAL | Status: DC
  Administered 2020-09-25: 1 via ORAL
  Filled 2020-09-25: qty 1

## 2020-09-25 MED ORDER — DEXTROSE 5 % IV SOLN
INTRAVENOUS | Status: DC | PRN
  Administered 2020-09-25: 3 g via INTRAVENOUS

## 2020-09-25 MED ORDER — ESTRADIOL 1 MG PO TABS
1.0000 mg | ORAL_TABLET | Freq: Every day | ORAL | Status: DC
  Administered 2020-09-26: 1 mg via ORAL
  Filled 2020-09-25: qty 1

## 2020-09-25 MED ORDER — PROPOFOL 10 MG/ML IV BOLUS
INTRAVENOUS | Status: DC | PRN
  Administered 2020-09-25: 200 mg via INTRAVENOUS

## 2020-09-25 MED ORDER — MIDAZOLAM HCL 5 MG/5ML IJ SOLN
INTRAMUSCULAR | Status: DC | PRN
  Administered 2020-09-25: 2 mg via INTRAVENOUS

## 2020-09-25 MED ORDER — BUPIVACAINE LIPOSOME 1.3 % IJ SUSP
20.0000 mL | Freq: Once | INTRAMUSCULAR | Status: DC
  Filled 2020-09-25: qty 20

## 2020-09-25 MED ORDER — DULOXETINE HCL 30 MG PO CPEP
60.0000 mg | ORAL_CAPSULE | Freq: Every day | ORAL | Status: DC
  Administered 2020-09-25: 60 mg via ORAL
  Filled 2020-09-25: qty 2

## 2020-09-25 MED ORDER — SUCCINYLCHOLINE CHLORIDE 20 MG/ML IJ SOLN
INTRAMUSCULAR | Status: DC | PRN
  Administered 2020-09-25: 140 mg via INTRAVENOUS

## 2020-09-25 MED ORDER — DIPHENHYDRAMINE HCL (SLEEP) 25 MG PO TABS: 25.0000 mg | ORAL_TABLET | Freq: Every evening | ORAL | Status: DC | PRN

## 2020-09-25 MED ORDER — LEVOTHYROXINE SODIUM 100 MCG PO TABS: 200.0000 ug | ORAL_TABLET | Freq: Every day | ORAL | Status: DC

## 2020-09-25 MED ORDER — LEVOTHYROXINE SODIUM 100 MCG PO TABS
200.0000 ug | ORAL_TABLET | ORAL | Status: DC
  Administered 2020-09-26: 200 ug via ORAL
  Filled 2020-09-25: qty 2

## 2020-09-25 MED ORDER — FENTANYL CITRATE (PF) 100 MCG/2ML IJ SOLN
INTRAMUSCULAR | Status: AC
  Filled 2020-09-25: qty 2

## 2020-09-25 MED ORDER — DIPHENHYDRAMINE HCL 25 MG PO CAPS: 25.0000 mg | ORAL_CAPSULE | Freq: Every evening | ORAL | Status: DC | PRN

## 2020-09-25 MED ORDER — PANTOPRAZOLE SODIUM 40 MG PO TBEC
40.0000 mg | DELAYED_RELEASE_TABLET | Freq: Every day | ORAL | Status: DC
  Administered 2020-09-26: 40 mg via ORAL
  Filled 2020-09-25: qty 1

## 2020-09-25 MED ORDER — CHLORHEXIDINE GLUCONATE 0.12 % MT SOLN
15.0000 mL | Freq: Once | OROMUCOSAL | Status: AC
  Administered 2020-09-25: 15 mL via OROMUCOSAL
  Filled 2020-09-25: qty 15

## 2020-09-25 MED ORDER — PREGABALIN 75 MG PO CAPS
75.0000 mg | ORAL_CAPSULE | Freq: Two times a day (BID) | ORAL | Status: DC
  Administered 2020-09-25 – 2020-09-26 (×2): 75 mg via ORAL
  Filled 2020-09-25 (×2): qty 1

## 2020-09-25 MED ORDER — LACTATED RINGERS IV SOLN: INTRAVENOUS | Status: DC

## 2020-09-25 MED ORDER — FENTANYL CITRATE (PF) 250 MCG/5ML IJ SOLN
INTRAMUSCULAR | Status: AC
  Filled 2020-09-25: qty 5

## 2020-09-25 MED ORDER — FENTANYL CITRATE (PF) 100 MCG/2ML IJ SOLN
25.0000 ug | INTRAMUSCULAR | Status: DC | PRN
  Administered 2020-09-25 (×2): 50 ug via INTRAVENOUS

## 2020-09-25 MED ORDER — ARTIFICIAL TEARS OPHTHALMIC OINT
TOPICAL_OINTMENT | OPHTHALMIC | Status: DC | PRN
  Administered 2020-09-25: 1 via OPHTHALMIC

## 2020-09-25 MED ORDER — FUROSEMIDE 20 MG PO TABS
20.0000 mg | ORAL_TABLET | Freq: Two times a day (BID) | ORAL | Status: DC
  Administered 2020-09-26: 20 mg via ORAL
  Filled 2020-09-25: qty 1

## 2020-09-25 MED ORDER — SODIUM CHLORIDE 0.9% FLUSH: 3.0000 mL | INTRAVENOUS | Status: DC | PRN

## 2020-09-25 MED ORDER — CALCITRIOL 0.5 MCG PO CAPS
1.0000 ug | ORAL_CAPSULE | Freq: Every day | ORAL | Status: DC
  Administered 2020-09-26: 1 ug via ORAL
  Filled 2020-09-25 (×2): qty 2

## 2020-09-25 MED ORDER — DEXAMETHASONE SODIUM PHOSPHATE 10 MG/ML IJ SOLN
10.0000 mg | Freq: Once | INTRAMUSCULAR | Status: AC
  Administered 2020-09-25: 5 mg via INTRAVENOUS
  Filled 2020-09-25: qty 1

## 2020-09-25 MED ORDER — EPHEDRINE SULFATE 50 MG/ML IJ SOLN
INTRAMUSCULAR | Status: DC | PRN
  Administered 2020-09-25 (×2): 10 mg via INTRAVENOUS

## 2020-09-25 MED ORDER — THROMBIN 20000 UNITS EX SOLR
CUTANEOUS | Status: DC | PRN
  Administered 2020-09-25: 20 mL via TOPICAL

## 2020-09-25 MED ORDER — OXYBUTYNIN CHLORIDE ER 10 MG PO TB24
10.0000 mg | ORAL_TABLET | Freq: Every day | ORAL | Status: DC
  Administered 2020-09-25: 10 mg via ORAL
  Filled 2020-09-25: qty 1

## 2020-09-25 MED ORDER — ONDANSETRON HCL 4 MG/2ML IJ SOLN: 4.0000 mg | Freq: Four times a day (QID) | INTRAMUSCULAR | Status: DC | PRN

## 2020-09-25 MED ORDER — PHENYLEPHRINE HCL-NACL 10-0.9 MG/250ML-% IV SOLN
INTRAVENOUS | Status: DC | PRN
  Administered 2020-09-25: 50 ug/min via INTRAVENOUS

## 2020-09-25 MED ORDER — LIDOCAINE 2% (20 MG/ML) 5 ML SYRINGE
INTRAMUSCULAR | Status: DC | PRN
  Administered 2020-09-25: 80 mg via INTRAVENOUS

## 2020-09-25 MED ORDER — ONDANSETRON HCL 4 MG/2ML IJ SOLN
INTRAMUSCULAR | Status: DC | PRN
  Administered 2020-09-25: 4 mg via INTRAVENOUS

## 2020-09-25 MED ORDER — 0.9 % SODIUM CHLORIDE (POUR BTL) OPTIME
TOPICAL | Status: DC | PRN
  Administered 2020-09-25 (×2): 1000 mL

## 2020-09-25 MED ORDER — SODIUM CHLORIDE 0.9 % IV SOLN
250.0000 mL | INTRAVENOUS | Status: DC
  Administered 2020-09-25: 250 mL via INTRAVENOUS

## 2020-09-25 MED ORDER — LISINOPRIL 20 MG PO TABS
20.0000 mg | ORAL_TABLET | Freq: Every day | ORAL | Status: DC
  Administered 2020-09-25: 20 mg via ORAL
  Filled 2020-09-25 (×2): qty 1

## 2020-09-25 MED ORDER — MUSCLE RUB 10-15 % EX CREA
1.0000 "application " | TOPICAL_CREAM | Freq: Two times a day (BID) | CUTANEOUS | Status: DC | PRN
  Filled 2020-09-25: qty 85

## 2020-09-25 MED ORDER — FENTANYL CITRATE (PF) 100 MCG/2ML IJ SOLN
INTRAMUSCULAR | Status: DC | PRN
  Administered 2020-09-25 (×2): 50 ug via INTRAVENOUS
  Administered 2020-09-25: 100 ug via INTRAVENOUS

## 2020-09-25 MED ORDER — ALBUMIN HUMAN 5 % IV SOLN: INTRAVENOUS | Status: DC | PRN

## 2020-09-25 MED ORDER — LEVOTHYROXINE SODIUM 25 MCG PO TABS: 25.0000 ug | ORAL_TABLET | ORAL | Status: DC

## 2020-09-25 MED ORDER — METOPROLOL SUCCINATE ER 50 MG PO TB24
50.0000 mg | ORAL_TABLET | Freq: Every day | ORAL | Status: DC
  Administered 2020-09-26: 50 mg via ORAL
  Filled 2020-09-25: qty 1

## 2020-09-25 MED ORDER — PROPOFOL 10 MG/ML IV BOLUS
INTRAVENOUS | Status: AC
  Filled 2020-09-25: qty 20

## 2020-09-25 MED ORDER — FENOFIBRATE 54 MG PO TABS
108.0000 mg | ORAL_TABLET | Freq: Every day | ORAL | Status: DC
  Administered 2020-09-25: 108 mg via ORAL
  Filled 2020-09-25: qty 2

## 2020-09-25 MED ORDER — CYCLOBENZAPRINE HCL 10 MG PO TABS: 10.0000 mg | ORAL_TABLET | Freq: Three times a day (TID) | ORAL | Status: DC | PRN

## 2020-09-25 MED ORDER — IBUPROFEN 200 MG PO TABS: 600.0000 mg | ORAL_TABLET | Freq: Four times a day (QID) | ORAL | Status: DC | PRN

## 2020-09-25 MED ORDER — TURMERIC 500 MG PO TABS: 1000.0000 mg | ORAL_TABLET | Freq: Every day | ORAL | Status: DC

## 2020-09-25 MED ORDER — SUCCINYLCHOLINE CHLORIDE 200 MG/10ML IV SOSY
PREFILLED_SYRINGE | INTRAVENOUS | Status: AC
  Filled 2020-09-25: qty 10

## 2020-09-25 MED ORDER — MENTHOL (TOPICAL ANALGESIC) 7.5 % EX PTCH: MEDICATED_PATCH | Freq: Two times a day (BID) | CUTANEOUS | Status: DC | PRN

## 2020-09-25 MED ORDER — LEVOTHYROXINE SODIUM 75 MCG PO TABS: 225.0000 ug | ORAL_TABLET | ORAL | Status: DC

## 2020-09-25 MED ORDER — BUPROPION HCL ER (XL) 150 MG PO TB24
150.0000 mg | ORAL_TABLET | Freq: Every day | ORAL | Status: DC
  Filled 2020-09-25 (×2): qty 1

## 2020-09-25 MED ORDER — ACETAMINOPHEN 325 MG PO TABS: 650.0000 mg | ORAL_TABLET | ORAL | Status: DC | PRN

## 2020-09-25 MED ORDER — PHENYLEPHRINE 40 MCG/ML (10ML) SYRINGE FOR IV PUSH (FOR BLOOD PRESSURE SUPPORT)
PREFILLED_SYRINGE | INTRAVENOUS | Status: AC
  Filled 2020-09-25: qty 10

## 2020-09-25 MED ORDER — ONDANSETRON HCL 4 MG/2ML IJ SOLN
INTRAMUSCULAR | Status: AC
  Filled 2020-09-25: qty 2

## 2020-09-25 MED ORDER — ORAL CARE MOUTH RINSE: 15.0000 mL | Freq: Once | OROMUCOSAL | Status: AC

## 2020-09-25 MED ORDER — OXYCODONE HCL 5 MG PO TABS: 5.0000 mg | ORAL_TABLET | Freq: Once | ORAL | Status: DC | PRN

## 2020-09-25 MED ORDER — OXYCODONE HCL 5 MG PO TABS
10.0000 mg | ORAL_TABLET | ORAL | Status: DC | PRN
  Administered 2020-09-25 – 2020-09-26 (×5): 10 mg via ORAL
  Filled 2020-09-25 (×5): qty 2

## 2020-09-25 MED ORDER — PHENOL 1.4 % MT LIQD: 1.0000 | OROMUCOSAL | Status: DC | PRN

## 2020-09-25 MED ORDER — SODIUM CHLORIDE 0.9% FLUSH: 3.0000 mL | Freq: Two times a day (BID) | INTRAVENOUS | Status: DC

## 2020-09-25 MED ORDER — LIDOCAINE-EPINEPHRINE 1 %-1:100000 IJ SOLN
INTRAMUSCULAR | Status: DC | PRN
  Administered 2020-09-25: 10 mL

## 2020-09-25 MED ORDER — HYDROMORPHONE HCL 1 MG/ML IJ SOLN: 0.5000 mg | INTRAMUSCULAR | Status: DC | PRN

## 2020-09-25 MED ORDER — INSULIN ASPART 100 UNIT/ML ~~LOC~~ SOLN
0.0000 [IU] | Freq: Three times a day (TID) | SUBCUTANEOUS | Status: DC
  Administered 2020-09-25 (×2): 8 [IU] via SUBCUTANEOUS
  Administered 2020-09-26: 3 [IU] via SUBCUTANEOUS
  Filled 2020-09-25: qty 0.15

## 2020-09-25 MED ORDER — BUPIVACAINE LIPOSOME 1.3 % IJ SUSP
INTRAMUSCULAR | Status: DC | PRN
  Administered 2020-09-25: 20 mL

## 2020-09-25 MED ORDER — MENTHOL 3 MG MT LOZG: 1.0000 | LOZENGE | OROMUCOSAL | Status: DC | PRN

## 2020-09-25 MED ORDER — THROMBIN 20000 UNITS EX SOLR
CUTANEOUS | Status: AC
  Filled 2020-09-25: qty 20000

## 2020-09-25 MED ORDER — PANTOPRAZOLE SODIUM 40 MG IV SOLR: 40.0000 mg | Freq: Every day | INTRAVENOUS | Status: DC

## 2020-09-25 MED ORDER — BUPROPION HCL ER (XL) 300 MG PO TB24
300.0000 mg | ORAL_TABLET | Freq: Every day | ORAL | Status: DC
  Administered 2020-09-26: 300 mg via ORAL
  Filled 2020-09-25: qty 1

## 2020-09-25 SURGICAL SUPPLY — 81 items
ADH SKN CLS APL DERMABOND .7 (GAUZE/BANDAGES/DRESSINGS) ×1
APL SKNCLS STERI-STRIP NONHPOA (GAUZE/BANDAGES/DRESSINGS) ×1
BASKET BONE COLLECTION (BASKET) ×3 IMPLANT
BENZOIN TINCTURE PRP APPL 2/3 (GAUZE/BANDAGES/DRESSINGS) ×3 IMPLANT
BIT DRILL 5.0/4.0 (BIT) ×1 IMPLANT
BLADE CLIPPER SURG (BLADE) IMPLANT
BLADE SURG 11 STRL SS (BLADE) ×3 IMPLANT
BONE VIVIGEN FORMABLE 5.4CC (Bone Implant) ×3 IMPLANT
BUR CUTTER 7.0 ROUND (BURR) ×3 IMPLANT
BUR MATCHSTICK NEURO 3.0 LAGG (BURR) ×3 IMPLANT
CANISTER SUCT 3000ML PPV (MISCELLANEOUS) ×3 IMPLANT
CAP LOCKING THREADED (Cap) ×12 IMPLANT
CARTRIDGE OIL MAESTRO DRILL (MISCELLANEOUS) ×1 IMPLANT
CLOSURE WOUND 1/2 X4 (GAUZE/BANDAGES/DRESSINGS) ×2
CNTNR URN SCR LID CUP LEK RST (MISCELLANEOUS) ×1 IMPLANT
CONT SPEC 4OZ STRL OR WHT (MISCELLANEOUS) ×3
COVER BACK TABLE 60X90IN (DRAPES) ×3 IMPLANT
COVER WAND RF STERILE (DRAPES) IMPLANT
DECANTER SPIKE VIAL GLASS SM (MISCELLANEOUS) ×3 IMPLANT
DERMABOND ADVANCED (GAUZE/BANDAGES/DRESSINGS) ×2
DERMABOND ADVANCED .7 DNX12 (GAUZE/BANDAGES/DRESSINGS) ×1 IMPLANT
DIFFUSER DRILL AIR PNEUMATIC (MISCELLANEOUS) ×3 IMPLANT
DRAPE C-ARM 42X72 X-RAY (DRAPES) ×3 IMPLANT
DRAPE C-ARMOR (DRAPES) ×3 IMPLANT
DRAPE HALF SHEET 40X57 (DRAPES) ×3 IMPLANT
DRAPE LAPAROTOMY 100X72X124 (DRAPES) ×3 IMPLANT
DRAPE SURG 17X23 STRL (DRAPES) ×3 IMPLANT
DRILL 5.0/4.0 (BIT) ×3
DRSG OPSITE 4X5.5 SM (GAUZE/BANDAGES/DRESSINGS) ×3 IMPLANT
DRSG OPSITE POSTOP 4X6 (GAUZE/BANDAGES/DRESSINGS) ×3 IMPLANT
DURAPREP 26ML APPLICATOR (WOUND CARE) ×3 IMPLANT
ELECT REM PT RETURN 9FT ADLT (ELECTROSURGICAL) ×3
ELECTRODE REM PT RTRN 9FT ADLT (ELECTROSURGICAL) ×1 IMPLANT
EVACUATOR 1/8 PVC DRAIN (DRAIN) ×3 IMPLANT
EVACUATOR 3/16  PVC DRAIN (DRAIN)
EVACUATOR 3/16 PVC DRAIN (DRAIN) IMPLANT
GAUZE 4X4 16PLY RFD (DISPOSABLE) ×3 IMPLANT
GAUZE SPONGE 4X4 12PLY STRL (GAUZE/BANDAGES/DRESSINGS) IMPLANT
GLOVE BIO SURGEON STRL SZ7 (GLOVE) ×3 IMPLANT
GLOVE BIO SURGEON STRL SZ7.5 (GLOVE) ×3 IMPLANT
GLOVE BIO SURGEON STRL SZ8 (GLOVE) ×6 IMPLANT
GLOVE BIOGEL PI IND STRL 7.0 (GLOVE) IMPLANT
GLOVE BIOGEL PI IND STRL 8 (GLOVE) ×2 IMPLANT
GLOVE BIOGEL PI INDICATOR 7.0 (GLOVE)
GLOVE BIOGEL PI INDICATOR 8 (GLOVE) ×4
GLOVE EXAM NITRILE XL STR (GLOVE) IMPLANT
GLOVE INDICATOR 8.5 STRL (GLOVE) ×6 IMPLANT
GLOVE SURG SS PI 6.0 STRL IVOR (GLOVE) ×12 IMPLANT
GLOVE SURG UNDER POLY LF SZ6.5 (GLOVE) ×3 IMPLANT
GLOVE SURG UNDER POLY LF SZ7 (GLOVE) ×3 IMPLANT
GOWN STRL REUS W/ TWL LRG LVL3 (GOWN DISPOSABLE) ×2 IMPLANT
GOWN STRL REUS W/ TWL XL LVL3 (GOWN DISPOSABLE) ×2 IMPLANT
GOWN STRL REUS W/TWL 2XL LVL3 (GOWN DISPOSABLE) ×3 IMPLANT
GOWN STRL REUS W/TWL LRG LVL3 (GOWN DISPOSABLE) ×6
GOWN STRL REUS W/TWL XL LVL3 (GOWN DISPOSABLE) ×6
HEMOSTAT POWDER KIT SURGIFOAM (HEMOSTASIS) IMPLANT
KIT BASIN OR (CUSTOM PROCEDURE TRAY) ×3 IMPLANT
KIT TURNOVER KIT B (KITS) ×3 IMPLANT
MILL MEDIUM DISP (BLADE) IMPLANT
NEEDLE HYPO 21X1.5 SAFETY (NEEDLE) ×3 IMPLANT
NEEDLE HYPO 25X1 1.5 SAFETY (NEEDLE) ×3 IMPLANT
NS IRRIG 1000ML POUR BTL (IV SOLUTION) ×6 IMPLANT
OIL CARTRIDGE MAESTRO DRILL (MISCELLANEOUS) ×3
PACK LAMINECTOMY NEURO (CUSTOM PROCEDURE TRAY) ×3 IMPLANT
PAD ARMBOARD 7.5X6 YLW CONV (MISCELLANEOUS) ×24 IMPLANT
ROD SPINAL 35MM (Rod) ×6 IMPLANT
SCREW PA THRD CREO TULIP 5.5X4 (Head) ×12 IMPLANT
SHAFT CREO 30MM (Neuro Prosthesis/Implant) ×12 IMPLANT
SPACER SUSTAIN TI 8X26X11 8D (Spacer) ×6 IMPLANT
SPONGE LAP 4X18 RFD (DISPOSABLE) IMPLANT
SPONGE SURGIFOAM ABS GEL 100 (HEMOSTASIS) ×3 IMPLANT
STRIP CLOSURE SKIN 1/2X4 (GAUZE/BANDAGES/DRESSINGS) ×4 IMPLANT
SUT VIC AB 0 CT1 18XCR BRD8 (SUTURE) ×1 IMPLANT
SUT VIC AB 0 CT1 8-18 (SUTURE) ×3
SUT VIC AB 2-0 CT1 18 (SUTURE) ×3 IMPLANT
SUT VIC AB 4-0 PS2 27 (SUTURE) ×3 IMPLANT
SYR 20ML LL LF (SYRINGE) ×6 IMPLANT
TOWEL GREEN STERILE (TOWEL DISPOSABLE) ×3 IMPLANT
TOWEL GREEN STERILE FF (TOWEL DISPOSABLE) ×3 IMPLANT
TRAY FOLEY MTR SLVR 16FR STAT (SET/KITS/TRAYS/PACK) ×3 IMPLANT
WATER STERILE IRR 1000ML POUR (IV SOLUTION) ×3 IMPLANT

## 2020-09-25 NOTE — Anesthesia Postprocedure Evaluation (Signed)
Anesthesia Post Note  Patient: Margaret [name]  Procedure(s) Performed: POSTERIOR LUMBAR INTERBODY FUSION LUMBAR FOUR-FIVE (N/A Spine Lumbar)     Patient location during evaluation: PACU Anesthesia Type: General Level of consciousness: awake and alert Pain management: pain level controlled Vital Signs Assessment: post-procedure vital signs reviewed and stable Respiratory status: spontaneous breathing, nonlabored ventilation and respiratory function stable Cardiovascular status: blood pressure returned to baseline and stable Postop Assessment: no apparent nausea or vomiting Anesthetic complications: no   No complications documented.  Last Vitals:  Vitals:   09/25/20 1230 09/25/20 1245  BP: (!) 149/71 (!) 129/58  Pulse: 74 74  Resp: 18 19  Temp:    SpO2: 96% 95%    Last Pain:  Vitals:   09/25/20 1245  PainSc: 0-No pain                 Beryle Lathe

## 2020-09-25 NOTE — Addendum Note (Signed)
Addendum  created 09/25/20 1350 by Dorie Rank, CRNA   Flowsheet accepted, Intraprocedure Flowsheets edited

## 2020-09-25 NOTE — Transfer of Care (Signed)
Immediate Anesthesia Transfer of Care Note  Patient: Margaret [name]  Procedure(s) Performed: POSTERIOR LUMBAR INTERBODY FUSION LUMBAR FOUR-FIVE (N/A Spine Lumbar)  Patient Location: PACU  Anesthesia Type:General  Level of Consciousness: awake, alert  and oriented  Airway & Oxygen Therapy: Patient connected to face mask oxygen  Post-op Assessment: Post -op Vital signs reviewed and stable  Post vital signs: stable  Last Vitals:  Vitals Value Taken Time  BP 126/80 09/25/20 1214  Temp    Pulse 69 09/25/20 1215  Resp 15 09/25/20 1215  SpO2 99 % 09/25/20 1215  Vitals shown include unvalidated device data.  Last Pain:  Vitals:   09/25/20 0743  PainSc: 5       Patients Stated Pain Goal: 3 (09/25/20 0743)  Complications: No complications documented.

## 2020-09-25 NOTE — Anesthesia Procedure Notes (Addendum)
Procedure Name: Intubation Date/Time: 09/25/2020 8:34 AM Performed by: Lavell Luster, CRNA Pre-anesthesia Checklist: Patient identified, Emergency Drugs available, Suction available, Patient being monitored and Timeout performed Patient Re-evaluated:Patient Re-evaluated prior to induction Oxygen Delivery Method: Circle system utilized Preoxygenation: Pre-oxygenation with 100% oxygen Induction Type: IV induction and Rapid sequence Laryngoscope Size: Mac and 3 Grade View: Grade II Tube type: Oral Tube size: 7.5 mm Number of attempts: 1 Airway Equipment and Method: Stylet Placement Confirmation: ETT inserted through vocal cords under direct vision,  positive ETCO2 and breath sounds checked- equal and bilateral Secured at: 22 cm Tube secured with: Tape Dental Injury: Teeth and Oropharynx as per pre-operative assessment  Difficulty Due To: Difficulty was anticipated, Difficult Airway- due to large tongue and Difficult Airway- due to reduced neck mobility

## 2020-09-25 NOTE — Progress Notes (Signed)
Orthopedic Tech Progress Note Patient Details:  JULLISA GRIGORYAN 1961/03/11 [id number] Patient has brace Patient ID: [name], female   DOB: [birth year], 59 y.o.   MRN: [id number]   Donald Pore 09/25/2020, 3:37 PM

## 2020-09-25 NOTE — H&P (Signed)
Margaret [name] is an 59 y.o. female.   Chief Complaint: Back and left leg pain HPI: 59 year old female with progressive worsening back and left L5 radiculopathy from recurrent herniated disc L4-5 left.  Patient previously undergone a microdiscectomy back in January progressive worsening back and left leg pain refractory to conservative treatment.  Work-up revealed recurrent herniated disc and evidence of instability so patient was recommended redo decompressive laminectomy redo discectomy and interbody fusion.  I extensively gone over the risks and benefits of that operation with her as well as perioperative course expectations of outcome and alternatives of surgery and she understands and agrees to proceed forward.  Past Medical History:  Diagnosis Date  . Anxiety   . Arthritis    knees, lower back, hands  . Depression   . Diabetes mellitus without complication (HCC)   . Hyperlipidemia   . Hypertension    on meds, reviewed by Dr. Jacinto Halim for cardiac clearance - 09/2013, told that she was cleared & no need to follow up with him   . Hypothyroidism   . Mental disorder    perimenopause use of Paxil  . SVD (spontaneous vaginal delivery)    x 3  . Thyroid disease   . Total knee replacement status   . Wears dentures    upper and lower    Past Surgical History:  Procedure Laterality Date  . BACK SURGERY    . CARPAL TUNNEL RELEASE     2000, 2001, both hands  . DILATATION & CURETTAGE/HYSTEROSCOPY WITH MYOSURE N/A 05/25/2018   Procedure: DILATATION & CURETTAGE/HYSTEROSCOPY WITH MYOSURE;  Surgeon: Richardean Chimera, MD;  Location: [facility];  Service: Gynecology;  Laterality: N/A;  . DILATATION & CURRETTAGE/HYSTEROSCOPY WITH RESECTOCOPE  09/23/2012   Procedure: DILATATION & CURETTAGE/HYSTEROSCOPY WITH RESECTOCOPE;  Surgeon: Juluis Mire, MD;  Location: [facility];  Service: Gynecology;  Laterality: N/A;  . HAND SURGERY Bilateral    carpel tunnel surgery- bilateral  . JOINT REPLACEMENT  2000, 2012,   knee  replacement bilateral left-2000 & 2012, right-2002  . MULTIPLE TOOTH EXTRACTIONS     upper and lower dentures  . TONSILLECTOMY    . TOTAL KNEE REVISION  11/08/2012   Procedure: TOTAL KNEE REVISION;  Surgeon: Valeria Batman, MD;  Location: [facility] OR;  Service: Orthopedics;  Laterality: Right;  Revision Right Total Knee Replacement   . TUBAL LIGATION    . VAGINAL BIRTH AFTER CESAREAN SECTION     83, 85,87 - all vaginal - no c/s per patient  . VAGINAL DELIVERY  83, 85, 87   x 3    Family History  Problem Relation Age of Onset  . Hyperlipidemia Mother   . Hypertension Mother   . Heart disease Mother   . Kidney disease Mother   . Rheumatologic disease Mother   . Heart disease Father   . Cancer Father   . Heart disease Sister   . Hypertension Sister    Social History:  reports that she quit smoking about 12 years ago. Her smoking use included cigarettes. She has a 25.00 pack-year smoking history. She has never used smokeless tobacco. She reports that she does not drink alcohol and does not use drugs.  Allergies:  Allergies  Allergen Reactions  . Codeine Nausea Only and Nausea And Vomiting    Medications Prior to Admission  Medication Sig Dispense Refill  . buPROPion (WELLBUTRIN XL) 150 MG 24 hr tablet Take 150 mg by mouth daily at 2 PM. Take at 2pm    .  buPROPion (WELLBUTRIN XL) 300 MG 24 hr tablet Take 300 mg by mouth daily with breakfast.    . calcitRIOL (ROCALTROL) 0.5 MCG capsule Take 1 mcg by mouth daily.    . Cholecalciferol (VITAMIN D) 125 MCG (5000 UT) CAPS Take 5,000 Units by mouth daily.    . diphenhydrAMINE (SOMINEX) 25 MG tablet Take 25 mg by mouth at bedtime as needed for allergies. sleep    . DULoxetine (CYMBALTA) 60 MG capsule Take 60 mg by mouth at bedtime.    Marland Kitchen estradiol (ESTRACE) 1 MG tablet Take 1 mg by mouth daily.     . fenofibrate 54 MG tablet Take 108 mg by mouth at bedtime.     . furosemide (LASIX) 20 MG tablet Take 20 mg by mouth 2 (two) times daily with  breakfast and lunch.    Marland Kitchen HYDROcodone-acetaminophen (NORCO/VICODIN) 5-325 MG tablet Take 1 tablet by mouth 2 (two) times daily as needed for up to 5 days for moderate pain or severe pain. (Patient taking differently: Take 1 tablet by mouth every 6 (six) hours. ) 30 tablet 0  . ibuprofen (ADVIL,MOTRIN) 200 MG tablet Take 600 mg by mouth every 6 (six) hours as needed for moderate pain.    Marland Kitchen levothyroxine (SYNTHROID) 25 MCG tablet Take 25 mcg by mouth every Monday, Wednesday, and Friday. Takes with on MWF for a total of    . levothyroxine (SYNTHROID, LEVOTHROID) 200 MCG tablet Take 200 mcg by mouth daily before breakfast.     . lisinopril (ZESTRIL) 20 MG tablet Take 20 mg by mouth daily.    . meloxicam (MOBIC) 15 MG tablet Take 15 mg by mouth daily.    . Menthol, Topical Analgesic, (ICY HOT ADVANCED RELIEF EX) Apply 1 application topically 2 (two) times daily as needed (pain).    . metFORMIN (GLUCOPHAGE-XR) 500 MG 24 hr tablet Take 500 mg by mouth daily with breakfast.    . methocarbamol (ROBAXIN) 500 MG tablet Take 1,000 mg by mouth 4 (four) times daily as needed for muscle spasms.    . metoprolol succinate (TOPROL-XL) 50 MG 24 hr tablet Take 50 mg by mouth daily.     Marland Kitchen oxybutynin (DITROPAN-XL) 10 MG 24 hr tablet Take 10 mg by mouth at bedtime.    . pregabalin (LYRICA) 75 MG capsule TAKE 1 CAPSULE BY MOUTH TWICE A DAY (Patient taking differently: Take 75 mg by mouth 2 (two) times daily. ) 60 capsule 2  . progesterone (PROMETRIUM) 100 MG capsule Take 100 mg by mouth at bedtime.    . Turmeric 500 MG TABS Take 1,000 mg by mouth daily.      Results for orders placed or performed during the hospital encounter of 09/25/20 (from the past 48 hour(s))  Glucose, capillary     Status: Abnormal   Collection Time: 09/25/20  7:39 AM  Result Value Ref Range   Glucose-Capillary 236 (H) 70 - 99 mg/dL    Comment: Glucose reference range applies only to samples taken after fasting for at least 8  hours.   No results found.  Review of Systems  Musculoskeletal: Positive for back pain.  Neurological: Positive for numbness.    There were no vitals taken for this visit. Physical Exam HENT:     Head: Normocephalic.     Right Ear: Tympanic membrane normal.     Nose: Nose normal.     Mouth/Throat:     Mouth: Mucous membranes are moist.  Cardiovascular:     Rate  and Rhythm: Normal rate.     Pulses: Normal pulses.  Pulmonary:     Effort: Pulmonary effort is normal.  Abdominal:     General: Abdomen is flat.  Musculoskeletal:        General: Normal range of motion.  Skin:    General: Skin is warm.  Neurological:     Mental Status: She is alert.     Comments: Patient is awake and alert strength is 5-5 iliopsoas, quads, hamstrings, gastrocs, into tibialis, and EHL on the right with five of five strength in the left lower extremity except 4+ out of 5 EHL weakness      Assessment/Plan 59 year old presents for redo decompressive laminectomy and interbody fusion L4-5  Mariam Dollar, MD 09/25/2020, 7:52 AM

## 2020-09-25 NOTE — Op Note (Signed)
Preoperative diagnosis: Recurrent herniated nucleus pulposus L4-5 left lumbar spinal stenosis and grade 1 spondylolisthesis L4-5  Postoperative diagnosis: Same  Procedure: #1 redo complete decompressive laminectomy L4-5 with complete medial facetectomies and foraminotomies of the L4 and L5 nerve roots in excess and requiring more work than would be needed with a standard interbody fusion  2.  Posterior lumbar interbody fusion L4-5 utilizing the globus insert and rotate titanium peek cages packed with locally harvested autograft mixed with vivigen  #3 cortical screw fixation L4-5 utilizing globus Creo amp modular cortical screw set  4.  Open reduction spinal deformity  Surgeon: Jillyn Hidden Katheryne Gorr  Assistant: Julien Girt  Anesthesia: General  EBL: Minimal  HPI: 59 year old female undergone previous discectomy about a year ago developed recurrent severe back and left leg pain work-up revealed large recurrent disc herniation dynamic x-rays revealed spondylolisthesis with motion.  Due to patient's progressive clinical syndrome imaging findings and failed conservative treatment I recommended decompression stabilization procedure at L4-5 I extensively reviewed the risks and benefits of the operation with her as well as perioperative course expectations of outcome and alternatives of surgery and she understands and agrees to proceed forward.  Operative procedure: Patient brought into the OR was due to general anesthesia positioned prone the Wilson frame her back was prepped and draped in routine sterile fashion her old incision was opened up and extended caudally subcutaneous tissue was dissected free and then the scar tissue was identified and subperiosteal dissection was carried lamina of L3-L4 and L5 bilaterally.  Intraoperative x-ray confirmed identification appropriate level so the spinous process at L4 was removed central decompression was begun first working on the right side allowed identification of  normal dura then marching over to the left working through the scar tissue and freeing up the scar tissue performed a complete medial facetectomy and radical foraminotomies of the L4 and L5 nerve root extensively under biting the superior to collating facet to gain access to the lateral margin disc base.  There was extensive mount of scar tissue as well as recurrent disc radiation at the side.  Then working contralaterally off on the right and performed a similar complete medial facetectomy and radical foraminotomies of the L4 and L5 nerve roots and gained access to the disc base laterally there as well.  Then worked underneath the scar tissue freed up all the recurrent disc and removed it cleaned out the disc base utilizing sequential distraction with an 11 distractor in place prepared the endplates performed complete discectomy and then inserted an 8 x 12 mm 8 degree cage that was been put packed with locally harvested autograft mixed.  Then working on the other side packed extensive mount of autograft centrally after adequate endplate preparation and inserted the contralateral cage.  Fluoroscopy confirmed adequate positioning of the cages and then the cortical screws were placed under fluoroscopy as well.  All screws had excellent purchase and fluoroscopy confirmed good position.  Wounds and copiously irrigated to Kassim stasis was maintained this heads were assembled on the [place] advanced slightly the foramina reinspected to confirm patency Gelfoam was overlaid top of the dura a 35 mm rod was then placed anchoring down to L5 the L4 screw was compressed against L5 medium Hemovac drain was placed and the wound was closed in layers with Vicryl and a running 4 subcuticular Dermabond benzoin Steri-Strips and sterile dressing was applied patient recovery in stable condition.  At the end of the case all needle count sponge counts were correct.

## 2020-09-25 NOTE — Progress Notes (Signed)
PHARMACIST - PHYSICIAN ORDER COMMUNICATION  CONCERNING: P&T Medication Policy on Herbal Medications  DESCRIPTION:  This patient's order for:  turmeric  has been noted.  This product(s) is classified as an "herbal" or natural product. Due to a lack of definitive safety studies or FDA approval, nonstandard manufacturing practices, plus the potential risk of unknown drug-drug interactions while on inpatient medications, the Pharmacy and Therapeutics Committee does not permit the use of "herbal" or natural products of this type within [facility].   ACTION TAKEN: The pharmacy department is unable to verify this order at this time and your patient has been informed of this safety policy. Please reevaluate patient's clinical condition at discharge and address if the herbal or natural product(s) should be resumed at that time.  Vicki Mallet, PharmD, BCPS, [facility] Clinical Pharmacist

## 2020-09-26 ENCOUNTER — Encounter (HOSPITAL_COMMUNITY): Payer: Self-pay | Admitting: Neurosurgery

## 2020-09-26 ENCOUNTER — Other Ambulatory Visit: Payer: Self-pay

## 2020-09-26 DIAGNOSIS — M5116 Intervertebral disc disorders with radiculopathy, lumbar region: Secondary | ICD-10-CM | POA: Diagnosis not present

## 2020-09-26 LAB — GLUCOSE, CAPILLARY: Glucose-Capillary: 183 mg/dL — ABNORMAL HIGH (ref 70–99)

## 2020-09-26 MED ORDER — OXYCODONE-ACETAMINOPHEN 5-325 MG PO TABS
1.0000 | ORAL_TABLET | ORAL | 0 refills | Status: AC | PRN
Start: 2020-09-26 — End: 2021-09-26

## 2020-09-26 MED ORDER — METHOCARBAMOL 500 MG PO TABS: 500.0000 mg | ORAL_TABLET | Freq: Four times a day (QID) | ORAL | 0 refills | Status: AC

## 2020-09-26 NOTE — Evaluation (Signed)
Occupational Therapy Evaluation Patient Details Name: Margaret [name] MRN: [id number] DOB: 09-26-1961 Today's Date: 09/26/2020    History of Present Illness 59 year old female with progressive worsening back and left L5 radiculopathy from recurrent herniated disc L4-5 left.  Patient previously undergone a microdiscectomy back in January 2021.  Underwent Posterior lumbar interbody fusion L4-5 utilizing the globus insert and rotate titanium peek cages packed with locally harvested autograft mixed with vivigen 12/8.  PMH includes: Anxiety, depression, diabetes, B TKR, HTN and hyperlipdemia.   Clinical Impression   Patient admitted with the above diagnosis and procedure.  Deficits are listed below.  She demonstrates good understanding of back precautions and use of hip kit for lower body ADL.  She will have intermittent help at home from her son's, sister and a neighbor.  No further needs in the acute setting and no OT at home.      Follow Up Recommendations  No OT follow up    Equipment Recommendations  None recommended by OT    Recommendations for Other Services       Precautions / Restrictions Precautions Precautions: Back Precaution Booklet Issued: Yes (comment) Precaution Comments: Verbalized understanding. Required Braces or Orthoses: Spinal Brace Spinal Brace: Lumbar corset Restrictions Weight Bearing Restrictions: No      Mobility Bed Mobility Overal bed mobility: Modified Independent                  Transfers Overall transfer level: Modified independent Equipment used: Rolling walker (2 wheeled)                  Balance Overall balance assessment: No apparent balance deficits (not formally assessed)                                         ADL either performed or assessed with clinical judgement   ADL Overall ADL's : Modified independent                                       General ADL Comments: Able to use hip  kit with no issues.  Moving in the room without RW.     Vision Baseline Vision/History: Wears glasses Patient Visual Report: No change from baseline       Perception     Praxis      Pertinent Vitals/Pain Pain Assessment: Faces Faces Pain Scale: Hurts little more Pain Location: low bakc Pain Descriptors / Indicators: Aching;Grimacing Pain Intervention(s): Monitored during session     Hand Dominance     Extremity/Trunk Assessment Upper Extremity Assessment Upper Extremity Assessment: Overall WFL for tasks assessed   Lower Extremity Assessment Lower Extremity Assessment: Defer to PT evaluation   Cervical / Trunk Assessment Cervical / Trunk Assessment: Normal   Communication Communication Communication: No difficulties   Cognition Arousal/Alertness: Awake/alert Behavior During Therapy: WFL for tasks assessed/performed Overall Cognitive Status: Within Functional Limits for tasks assessed                                     General Comments   VSS    Exercises     Shoulder Instructions      Home Living Family/patient expects to be discharged to:: Private residence Living Arrangements: Alone Available  Help at Discharge: Family;Available PRN/intermittently Type of Home: House Home Access: Level entry     Home Layout: One level     Bathroom Shower/Tub: Occupational psychologist: Handicapped height     Home Equipment: Environmental consultant - 4 wheels;Adaptive equipment;Shower seat;Grab bars - tub/shower;Hand held shower head Adaptive Equipment: Reacher        Prior Functioning/Environment Level of Independence: Independent with assistive device(s)        Comments: 4WRW and Modified shower.        OT Problem List: Pain      OT Treatment/Interventions:      OT Goals(Current goals can be found in the care plan section) Acute Rehab OT Goals Patient Stated Goal: Transition home. OT Goal Formulation: With patient Time For Goal Achievement:  09/26/20 Potential to Achieve Goals: Good  OT Frequency:     Barriers to D/C:  none noted          Co-evaluation              AM-PAC OT "6 Clicks" Daily Activity     Outcome Measure Help from another person eating meals?: None Help from another person taking care of personal grooming?: None Help from another person toileting, which includes using toliet, bedpan, or urinal?: None Help from another person bathing (including washing, rinsing, drying)?: None Help from another person to put on and taking off regular upper body clothing?: None Help from another person to put on and taking off regular lower body clothing?: None 6 Click Score: 24   End of Session Equipment Utilized During Treatment: Rolling walker;Back brace Nurse Communication: Other (comment) (No OT needs)  Activity Tolerance: Patient tolerated treatment well Patient left: in chair;with call bell/phone within reach  OT Visit Diagnosis: Pain Pain - part of body:  (Back)                Time: 5217-4715 OT Time Calculation (min): 35 min Charges:  OT General Charges $OT Visit: 1 Visit OT Evaluation $OT Eval Moderate Complexity: 1 Mod OT Treatments $Self Care/Home Management : 8-22 mins  09/26/2020  Rich, OTR/L  Acute Rehabilitation Services  Office:  [phone]   Metta Clines 09/26/2020, 9:47 AM

## 2020-09-26 NOTE — Progress Notes (Signed)
Patient is discharged from room 3C06 at this time. Alert and in stable condition. IV site d/c'd and instructions read to patient and son with understanding verbalized and all questions answered. Left unit via wheelchair with all belongings at side.

## 2020-09-26 NOTE — Discharge Instructions (Signed)

## 2020-09-26 NOTE — Discharge Summary (Addendum)
  Physician Discharge Summary  Patient ID: DEBROAH SHUTTLEWORTH MRN: [id number] DOB/AGE: 1961/09/07 59 y.o. Estimated body mass index is 60.49 kg/m as calculated from the following:   Height as of this encounter: 5\' 6"  (1.676 m).   Weight as of this encounter: 170 kg.   Admit date: 09/25/2020 Discharge date: 09/26/2020  Admission Diagnoses: Recurrent herniated disc instability L4-5  Discharge Diagnoses: Same Active Problems:   HNP (herniated nucleus pulposus), lumbar   Discharged Condition: good  Hospital Course: Patient is admitted to hospital underwent redo decompressive laminectomy interbody fusion L4-5.  Postoperative patient did very well recovering the floor on the floor was ambulating voiding spontaneously tolerating regular diet and stable for discharge home.  Consults: Significant Diagnostic Studies: Treatments: L4-5 posterior lumbar interbody fusion redo decompressive laminectomy Discharge Exam: Blood pressure (!) 111/45, pulse 73, temperature 99.7 F (37.6 C), temperature source Oral, resp. rate 18, height 5\' 6"  (1.676 m), weight (!) 170 kg, SpO2 97 %. Strength out of 5 wound clean dry and intact  Disposition: Home      Signed: 14/06/2020 09/26/2020, 8:11 AM

## 2020-09-26 NOTE — Progress Notes (Signed)
Physical Therapy Treatment Patient Details Name: Margaret [name] MRN: [id number] DOB: Aug 03, 1961 Today's Date: 09/26/2020    History of Present Illness 59 year old female with progressive worsening back and left L5 radiculopathy from recurrent herniated disc L4-5 left.  Patient previously undergone a microdiscectomy back in January 2021.  Underwent Posterior lumbar interbody fusion L4-5 on 12/8.  PMH includes: Anxiety, depression, diabetes, B TKR, HTN and hyperlipdemia.    PT Comments    Pt admitted with above diagnosis. At the time of PT eval, pt was able to demonstrate transfers and ambulation with gross supervision for safety and RW for support. Pt typically utilizes the rollator at home. She was ambulating around the room with furniture for support only, so feel she will be able to safely transition back to her rollator upon arrival home. Pt was educated on precautions, brace application/wearing schedule, appropriate activity progression, and car transfer. Pt currently with functional limitations due to the deficits listed below (see PT Problem List). Pt will benefit from skilled PT to increase their independence and safety with mobility to allow discharge to the venue listed below.      Follow Up Recommendations  No PT follow up;Supervision for mobility/OOB     Equipment Recommendations  None recommended by PT    Recommendations for Other Services       Precautions / Restrictions Precautions Precautions: Back Precaution Booklet Issued: Yes (comment) Precaution Comments: Reviewed handout and pt was cued for precautions during functional mobility. Required Braces or Orthoses: Spinal Brace Spinal Brace: Lumbar corset Restrictions Weight Bearing Restrictions: No    Mobility  Bed Mobility               General bed mobility comments: Pt was received sitting up in the recliner. Verbally reviewed log roll technique.  Transfers Overall transfer level: Modified  independent Equipment used: Rolling walker (2 wheeled)             General transfer comment: Pt demonstrated proper hand placement on seated surface for safety. No assist required.  Ambulation/Gait Ambulation/Gait assistance: Supervision Gait Distance (Feet): 250 Feet Assistive device: Rolling walker (2 wheeled) Gait Pattern/deviations: Step-through pattern;Decreased stride length;Trunk flexed Gait velocity: Decreased Gait velocity interpretation: 1.31 - 2.62 ft/sec, indicative of limited community ambulator General Gait Details: VC's for improved posture and closer proximity to RW.   Stairs             Wheelchair Mobility    Modified Rankin (Stroke Patients Only)       Balance Overall balance assessment: No apparent balance deficits (not formally assessed)                                          Cognition Arousal/Alertness: Awake/alert Behavior During Therapy: WFL for tasks assessed/performed Overall Cognitive Status: Within Functional Limits for tasks assessed                                        Exercises      General Comments        Pertinent Vitals/Pain Pain Assessment: Faces Faces Pain Scale: Hurts little more Pain Location: incision site Pain Descriptors / Indicators: Aching;Grimacing Pain Intervention(s): Limited activity within patient's tolerance;Monitored during session;Repositioned    Home Living Family/patient expects to be discharged to:: Private residence Living Arrangements: Alone Available  Help at Discharge: Family;Available PRN/intermittently Type of Home: House Home Access: Level entry   Home Layout: One level Home Equipment: Walker - 4 wheels;Adaptive equipment;Shower seat;Grab bars - tub/shower;Hand held shower head      Prior Function Level of Independence: Independent with assistive device(s)      Comments: rollator and Modified shower.   PT Goals (current goals can now be found in  the care plan section) Acute Rehab PT Goals Patient Stated Goal: Transition home. PT Goal Formulation: With patient Time For Goal Achievement: 10/03/20 Potential to Achieve Goals: Good    Frequency    Min 5X/week      PT Plan      Co-evaluation              AM-PAC PT "6 Clicks" Mobility   Outcome Measure  Help needed turning from your back to your side while in a flat bed without using bedrails?: None Help needed moving from lying on your back to sitting on the side of a flat bed without using bedrails?: None Help needed moving to and from a bed to a chair (including a wheelchair)?: None Help needed standing up from a chair using your arms (e.g., wheelchair or bedside chair)?: None Help needed to walk in hospital room?: A Little Help needed climbing 3-5 steps with a railing? : A Little 6 Click Score: 22    End of Session Equipment Utilized During Treatment: Gait belt Activity Tolerance: Patient tolerated treatment well Patient left: in chair;with call bell/phone within reach Nurse Communication: Mobility status PT Visit Diagnosis: Unsteadiness on feet (R26.81);Pain     Time: 0910-0940 PT Time Calculation (min) (ACUTE ONLY): 30 min  Charges:  $Gait Training: 8-22 mins                     Conni Slipper, PT, DPT Acute Rehabilitation Services Pager: [phone] Office: [phone]    Marylynn Pearson 09/26/2020, 2:48 PM

## 2020-10-23 IMAGING — MR MR LUMBAR SPINE W/O CM
4 of 5 series · 25 of 48 positions shown · non-contrast
Comparison: MRI lumbar spine 03/17/2017.

CLINICAL DATA: Low back pain radiating into the left leg for 2
months.

EXAM:
MRI LUMBAR SPINE WITHOUT CONTRAST
TECHNIQUE: Multiplanar, multisequence MR imaging of the lumbar spine was
performed. No intravenous contrast was administered.

[Series 4: T1 · sagittal · 4.0mm · 0.55mm/px · 5 of 13 slices shown (1 of 2)]
[im 1/13]
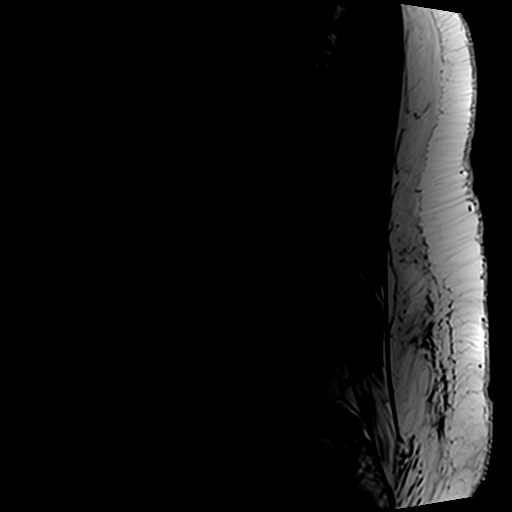
[im 4/13]
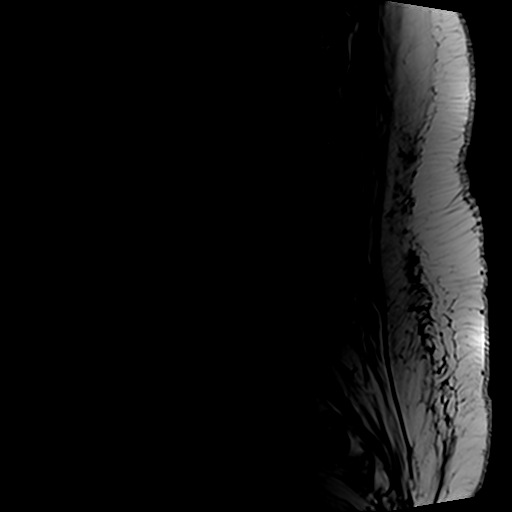
[im 7/13]
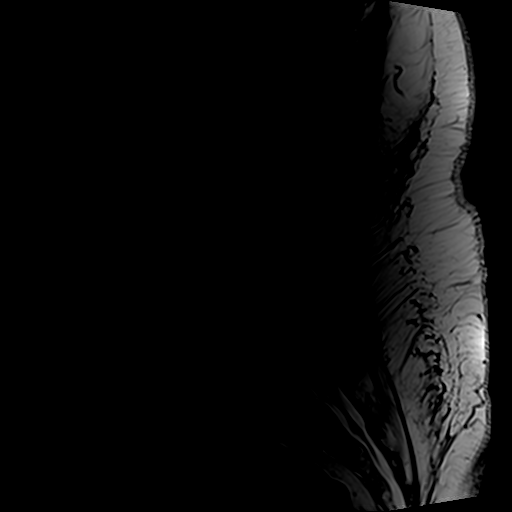
[im 10/13]
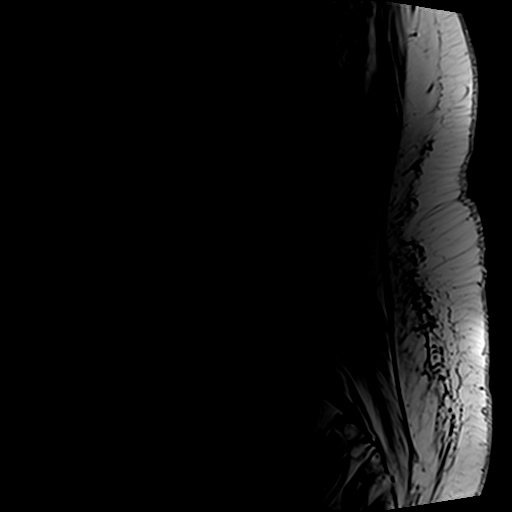
[im 13/13]
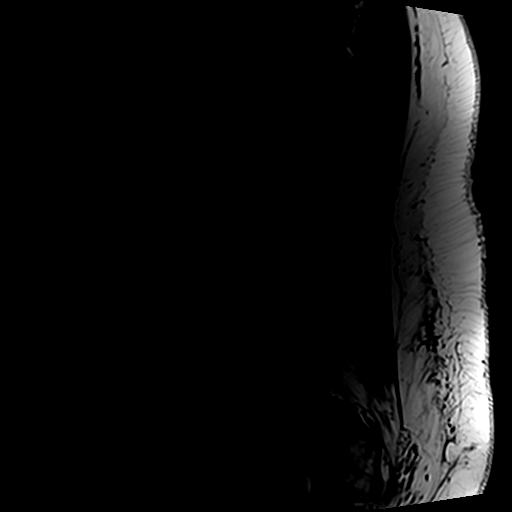

[Series 5: T2 · sagittal · 4.0mm · 0.55mm/px · 6 of 13 slices shown (1 of 2)]
[im 1/13]
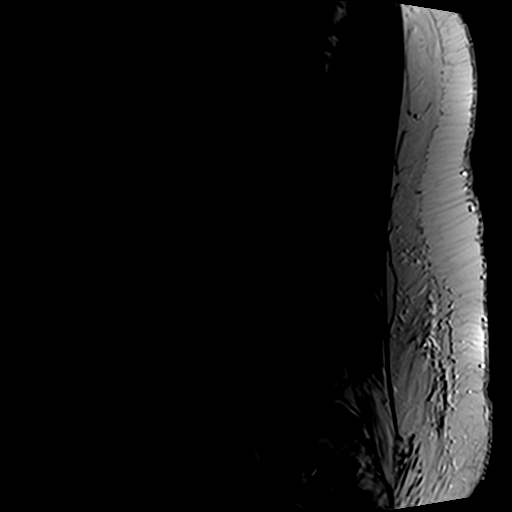
[im 3/13]
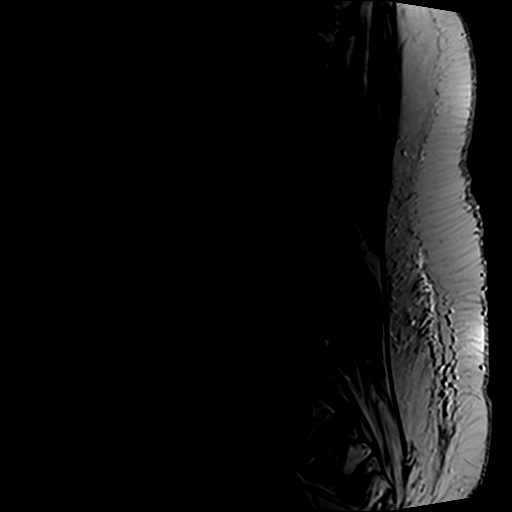
[im 5/13]
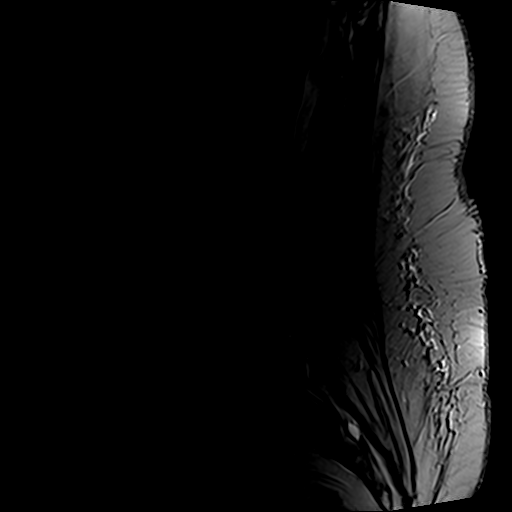
[im 8/13]
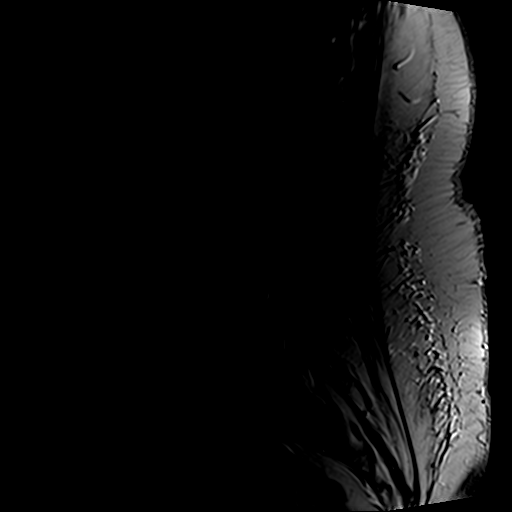
[im 10/13]
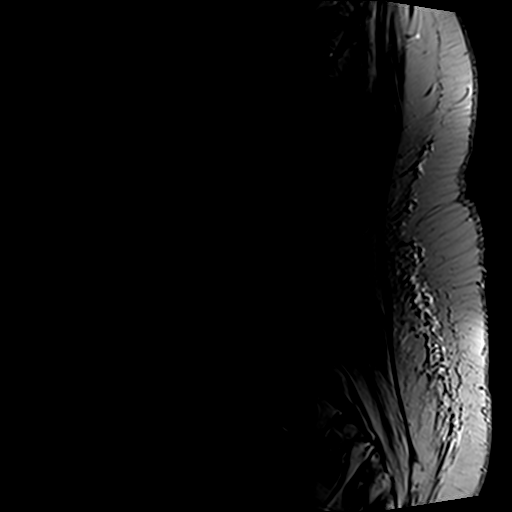
[im 13/13]
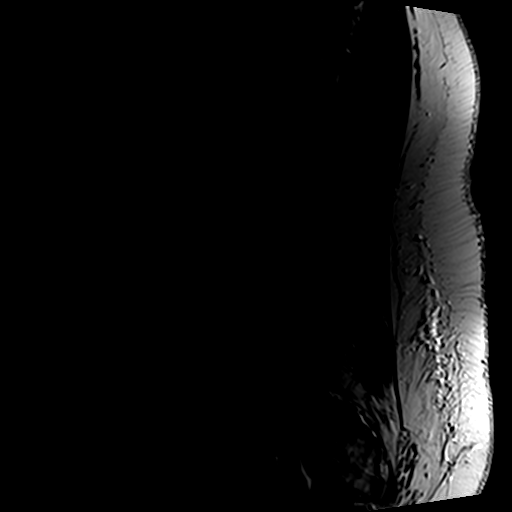

[Series 6: T2 · axial · 4.0mm · 0.70mm/px · z∈[-65,+135]mm · 10 of 37 slices shown (2 of 2)]
[im 3/37]
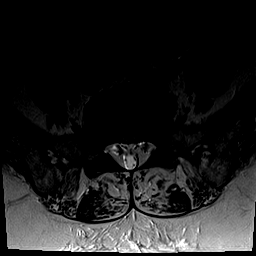
[im 5/37]
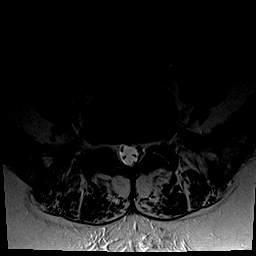
[im 8/37]
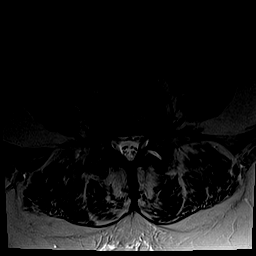
[im 13/37]
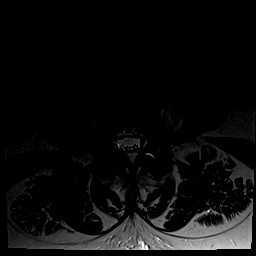
[im 17/37]
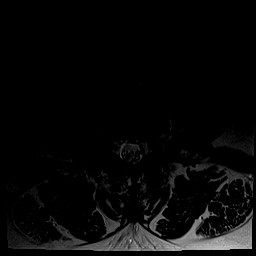
[im 20/37]
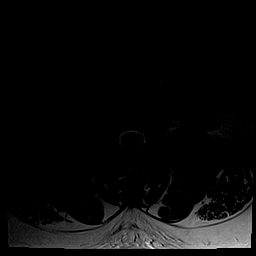
[im 22/37]
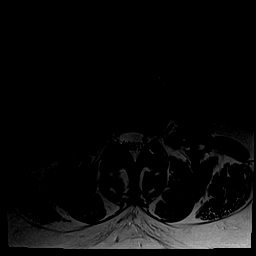
[im 27/37]
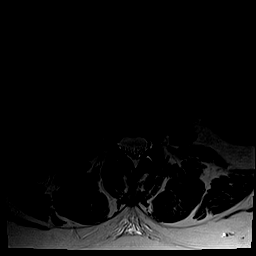
[im 32/37]
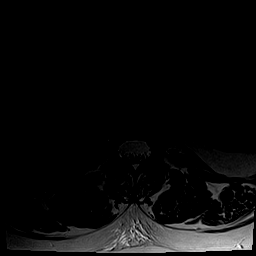
[im 37/37]
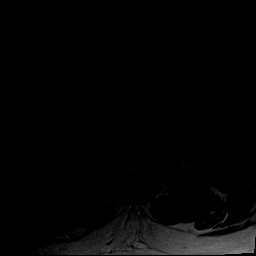

[Series 7: T1 · axial · 4.0mm · 0.35mm/px · z∈[-65,+109]mm · 4 of 37 slices shown (2 of 2)]
[im 3/37]
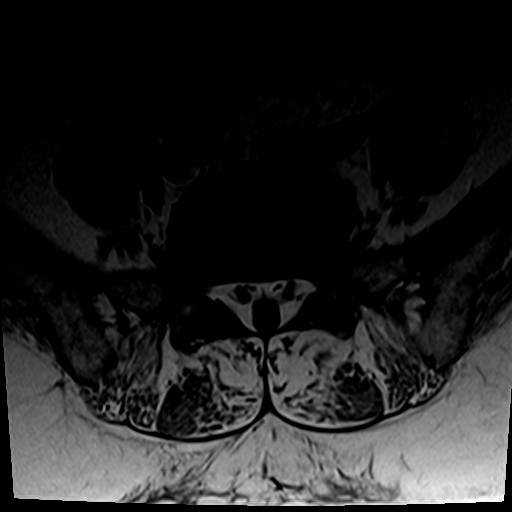
[im 5/37]
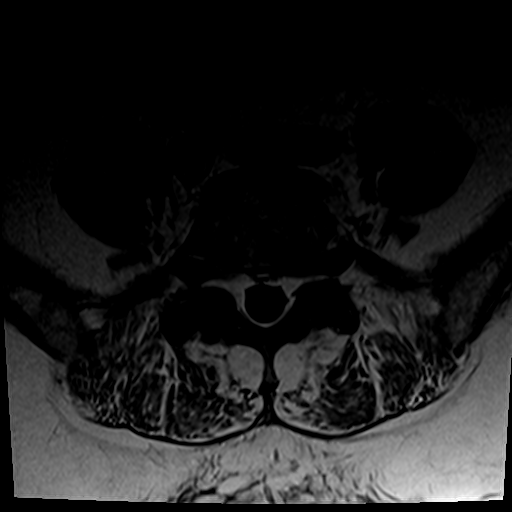
[im 20/37]
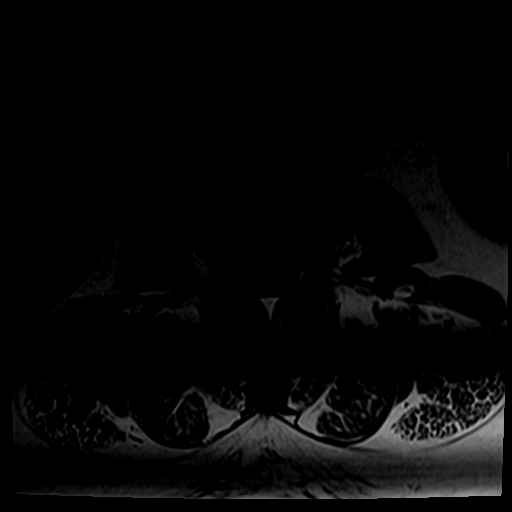
[im 32/37]
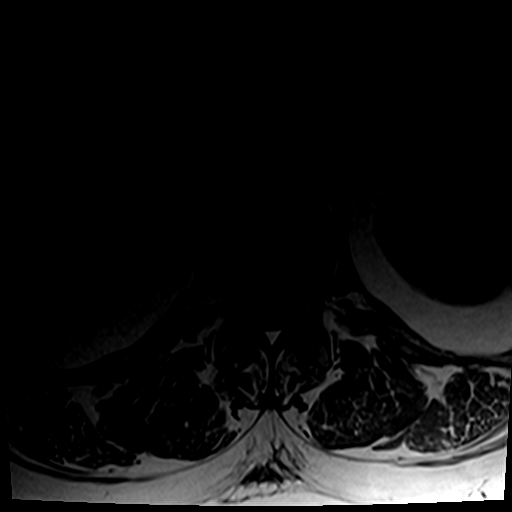

[25 of 48 positions shown; findings below may reference images not displayed]

FINDINGS: Segmentation:  Standard.

Alignment:  Trace anterolisthesis L4 on L5 is unchanged.

Vertebrae:  No fracture, evidence of discitis, or bone lesion.

Conus medullaris and cauda equina: Conus extends to the T12-L1
level. Conus and cauda equina appear normal.

Paraspinal and other soft tissues: Negative.

Disc levels:

T11-12 and T12-L1 are imaged in the sagittal plane only. There is
loss of disc space height and a shallow bulge at T11-12 without
stenosis. T12-L1 is negative. These levels are unchanged.

L1-2: Loss of disc space height and a shallow bulge without
stenosis, unchanged.

L2-3: Mild facet degenerative disease.  Otherwise negative.

L3-4: Loss of disc space height with a shallow bulge, mild facet
degenerative change and ligamentum flavum thickening. No central
canal stenosis. Mild left foraminal narrowing is unchanged. The
right foramen open.

L4-5: Facet degenerative disease. There is a shallow disc bulge and
a protrusion in the left foramen which is increased in size since
the prior exam. The protrusion causes marked left foraminal
narrowing and impinges on the exiting left L4 root. There is mild
right foraminal narrowing. The central canal is open.

L5-S1: Facet degenerative change and a shallow disc bulge without
central canal stenosis. Mild to moderate bilateral foraminal
narrowing is unchanged.
IMPRESSION: Protrusion in the left foramen at L4-5 has increased since the prior
examination and impinges on the exiting left L4 root. The appearance
of the lumbar spine is otherwise unchanged.

## 2020-11-05 DIAGNOSIS — M5440 Lumbago with sciatica, unspecified side: Secondary | ICD-10-CM | POA: Insufficient documentation

## 2020-11-05 DIAGNOSIS — M5126 Other intervertebral disc displacement, lumbar region: Secondary | ICD-10-CM | POA: Diagnosis not present

## 2020-11-18 DIAGNOSIS — F320 Major depressive disorder, single episode, mild: Secondary | ICD-10-CM | POA: Diagnosis not present

## 2020-12-16 DIAGNOSIS — Z634 Disappearance and death of family member: Secondary | ICD-10-CM | POA: Diagnosis not present

## 2020-12-16 DIAGNOSIS — F320 Major depressive disorder, single episode, mild: Secondary | ICD-10-CM | POA: Diagnosis not present

## 2020-12-17 DIAGNOSIS — M5440 Lumbago with sciatica, unspecified side: Secondary | ICD-10-CM | POA: Diagnosis not present

## 2021-01-14 DIAGNOSIS — M5440 Lumbago with sciatica, unspecified side: Secondary | ICD-10-CM | POA: Diagnosis not present

## 2021-01-16 DIAGNOSIS — F320 Major depressive disorder, single episode, mild: Secondary | ICD-10-CM | POA: Diagnosis not present

## 2021-02-11 DIAGNOSIS — M5440 Lumbago with sciatica, unspecified side: Secondary | ICD-10-CM | POA: Diagnosis not present

## 2021-02-14 DIAGNOSIS — Z634 Disappearance and death of family member: Secondary | ICD-10-CM | POA: Diagnosis not present

## 2021-02-14 DIAGNOSIS — F32A Depression, unspecified: Secondary | ICD-10-CM | POA: Diagnosis not present

## 2021-03-05 ENCOUNTER — Other Ambulatory Visit: Payer: Self-pay

## 2021-03-05 ENCOUNTER — Ambulatory Visit: Payer: PPO | Admitting: Podiatry

## 2021-03-05 DIAGNOSIS — E119 Type 2 diabetes mellitus without complications: Secondary | ICD-10-CM

## 2021-03-05 DIAGNOSIS — B351 Tinea unguium: Secondary | ICD-10-CM | POA: Diagnosis not present

## 2021-03-05 DIAGNOSIS — E0843 Diabetes mellitus due to underlying condition with diabetic autonomic (poly)neuropathy: Secondary | ICD-10-CM | POA: Diagnosis not present

## 2021-03-05 DIAGNOSIS — M216X1 Other acquired deformities of right foot: Secondary | ICD-10-CM | POA: Diagnosis not present

## 2021-03-05 DIAGNOSIS — M79674 Pain in right toe(s): Secondary | ICD-10-CM | POA: Diagnosis not present

## 2021-03-05 DIAGNOSIS — M79675 Pain in left toe(s): Secondary | ICD-10-CM | POA: Diagnosis not present

## 2021-03-05 NOTE — Progress Notes (Signed)
   SUBJECTIVE Patient with a history of diabetes mellitus presents to office today complaining of elongated, thickened nails that cause pain while ambulating in shoes.  She is unable to trim her own nails. Patient is here for further evaluation and treatment.   Past Medical History:  Diagnosis Date  . Anxiety   . Arthritis    knees, lower back, hands  . Depression   . Diabetes mellitus without complication (HCC)   . Hyperlipidemia   . Hypertension    on meds, reviewed by Dr. Jacinto Halim for cardiac clearance - 09/2013, told that she was cleared & no need to follow up with him   . Hypothyroidism   . Mental disorder    perimenopause use of Paxil  . SVD (spontaneous vaginal delivery)    x 3  . Thyroid disease   . Total knee replacement status   . Wears dentures    upper and lower    OBJECTIVE General Patient is awake, alert, and oriented x 3 and in no acute distress. Derm Skin is dry and supple bilateral. Negative open lesions or macerations. Remaining integument unremarkable. Nails are tender, long, thickened and dystrophic with subungual debris, consistent with onychomycosis, 1-5 bilateral. No signs of infection noted. Vasc  DP and PT pedal pulses palpable bilaterally. Temperature gradient within normal limits.  Neuro Epicritic and protective threshold sensation diminished bilaterally.  Musculoskeletal Exam No symptomatic pedal deformities noted bilateral. Muscular strength within normal limits.  Cavovarus foot deformity noted to the right foot  ASSESSMENT 1. Diabetes Mellitus w/ peripheral neuropathy 2. Onychomycosis of nail due to dermatophyte bilateral 3. Pain in foot bilateral 4.  Cavus foot deformity right  PLAN OF CARE 1. Patient evaluated today. 2. Instructed to maintain good pedal hygiene and foot care. Stressed importance of controlling blood sugar.  3. Mechanical debridement of nails 1-5 bilaterally performed using a nail nipper. Filed with dremel without incident.  4.  Return to clinic in 3 mos.     Felecia Shelling, DPM [facility]  Dr. Felecia Shelling, DPM    [address] [place], [place] [address]                Office [phone]  Fax [phone]

## 2021-03-12 ENCOUNTER — Ambulatory Visit (INDEPENDENT_AMBULATORY_CARE_PROVIDER_SITE_OTHER): Payer: PPO | Admitting: Podiatry

## 2021-03-12 ENCOUNTER — Other Ambulatory Visit: Payer: Self-pay

## 2021-03-12 DIAGNOSIS — M216X1 Other acquired deformities of right foot: Secondary | ICD-10-CM

## 2021-03-12 DIAGNOSIS — E0843 Diabetes mellitus due to underlying condition with diabetic autonomic (poly)neuropathy: Secondary | ICD-10-CM

## 2021-03-12 NOTE — Progress Notes (Signed)
Patient presented for foam casting for 3 pair custom diabetic shoe inserts. Patient was measured with a brannock device to be a size 10 1/2 xx wide  Diabetic shoes are chosen from the [employer] catalog.  The shoes chosen are Coral 981  The patient will be contacted when the inserts and shoes are ready for pick up.

## 2021-03-18 DIAGNOSIS — M5440 Lumbago with sciatica, unspecified side: Secondary | ICD-10-CM | POA: Diagnosis not present

## 2021-03-18 DIAGNOSIS — M461 Sacroiliitis, not elsewhere classified: Secondary | ICD-10-CM | POA: Diagnosis not present

## 2021-03-26 ENCOUNTER — Telehealth: Payer: Self-pay | Admitting: Podiatry

## 2021-03-26 NOTE — Telephone Encounter (Signed)
Pt returned call and left message and I called her back and she did go online and look at orthofeet shoes and liked the ones I was talking about [employer] and That is what we are going to order for pt to replace the coral 981 since it was not available in the width pt needs.

## 2021-03-26 NOTE — Telephone Encounter (Signed)
Left message for pt to call to discuss diabetic shoes. The ones she picked out do not come in her width needed. I do have a pair similar that have a velcro strap across the top but need her authorization.

## 2021-04-10 DIAGNOSIS — M461 Sacroiliitis, not elsewhere classified: Secondary | ICD-10-CM | POA: Diagnosis not present

## 2021-04-28 ENCOUNTER — Telehealth: Payer: Self-pay | Admitting: Podiatry

## 2021-04-28 NOTE — Telephone Encounter (Signed)
Pt called back and I told her that we have faxed the documents multiple times and have not received it back. She states she has an appt this Friday.

## 2021-04-28 NOTE — Telephone Encounter (Signed)
Pt left message on 7.6 while I was out of the office asking about status of diabetic shoes.  I returned call and left message we have not received the paperwork needed from her endocrinologist and that is what we are waiting on to get the shoes.

## 2021-05-06 ENCOUNTER — Telehealth: Payer: Self-pay | Admitting: Podiatry

## 2021-05-06 NOTE — Telephone Encounter (Signed)
Pt called and left a detailed message that she called her pcp again about the documents for the diabetic shoes and this time she was told that her doctor is no longer there and she would have to see one of the doctors for an exam so they can fill out the paperwork. She said the next available appt they had was 9.6.2022. She is wanting to get another pair of shoes at the shoe market but I recommended she wait on the inserts but her current shoes are worn out and almost have a hole in them.  I did offer pt to come in and pick out a pair of our shoes and purchase them for 150.00 and I could possible give her a insert to hold her over to her custom ones came in.. She said she will think about it but she did have a gift certificate to shoe market that she was trying to use. I told pt I would work on getting her a pair of the inserts in her size. She said she may take me up on that offer but it would be beginning of next month and thank you for trying to work with me.

## 2021-05-08 DIAGNOSIS — E782 Mixed hyperlipidemia: Secondary | ICD-10-CM | POA: Diagnosis not present

## 2021-05-08 DIAGNOSIS — E038 Other specified hypothyroidism: Secondary | ICD-10-CM | POA: Diagnosis not present

## 2021-05-08 DIAGNOSIS — E559 Vitamin D deficiency, unspecified: Secondary | ICD-10-CM | POA: Diagnosis not present

## 2021-05-08 DIAGNOSIS — Z0000 Encounter for general adult medical examination without abnormal findings: Secondary | ICD-10-CM | POA: Diagnosis not present

## 2021-05-08 DIAGNOSIS — I10 Essential (primary) hypertension: Secondary | ICD-10-CM | POA: Diagnosis not present

## 2021-05-08 DIAGNOSIS — E119 Type 2 diabetes mellitus without complications: Secondary | ICD-10-CM | POA: Diagnosis not present

## 2021-05-08 DIAGNOSIS — G894 Chronic pain syndrome: Secondary | ICD-10-CM | POA: Diagnosis not present

## 2021-05-08 DIAGNOSIS — F32A Depression, unspecified: Secondary | ICD-10-CM | POA: Diagnosis not present

## 2021-05-22 ENCOUNTER — Telehealth: Payer: Self-pay | Admitting: Podiatry

## 2021-05-22 NOTE — Telephone Encounter (Signed)
Received [employer] auth # [id number] for diabetic shoes and inserts([id number] X2/[id number] 6) valid 8.4.2022 thru 11.2.2022.Marland Kitchen

## 2021-06-06 ENCOUNTER — Encounter: Payer: Self-pay | Admitting: Podiatry

## 2021-06-06 ENCOUNTER — Ambulatory Visit: Payer: PPO | Admitting: Podiatry

## 2021-06-06 ENCOUNTER — Other Ambulatory Visit: Payer: Self-pay

## 2021-06-06 DIAGNOSIS — E0843 Diabetes mellitus due to underlying condition with diabetic autonomic (poly)neuropathy: Secondary | ICD-10-CM | POA: Diagnosis not present

## 2021-06-06 DIAGNOSIS — B351 Tinea unguium: Secondary | ICD-10-CM | POA: Diagnosis not present

## 2021-06-06 DIAGNOSIS — M79675 Pain in left toe(s): Secondary | ICD-10-CM

## 2021-06-06 DIAGNOSIS — M79674 Pain in right toe(s): Secondary | ICD-10-CM | POA: Diagnosis not present

## 2021-06-06 NOTE — Progress Notes (Signed)
This patient returns to my office for at risk foot care.  This patient requires this care by a professional since this patient will be at risk due to having diabetes nellitus.  Patient is now taking junuvia.    This patient is unable to cut nails herself since the patient cannot reach her nails.  These nails are painful walking and wearing shoes. Patient has developed a callus on the outside of her right foot since broken bone right foot. This patient presents for at risk foot care today.  General Appearance  Alert, conversant and in no acute stress.  Vascular  Dorsalis pedis and posterior tibial  pulses are palpable  bilaterally.  Capillary return is within normal limits  bilaterally. Temperature is within normal limits  bilaterally.  Neurologic  Senn-Weinstein monofilament wire test within normal limits  bilaterally. Muscle power within normal limits bilaterally.  Nails Thick disfigured discolored nails with subungual debris  from hallux to fifth toes bilaterally. No evidence of bacterial infection or drainage bilaterally.  Orthopedic  No limitations of motion  feet .  No crepitus or effusions noted.  No bony pathology or digital deformities noted.  Skin  normotropic skin with no porokeratosis noted bilaterally.  No signs of infections or ulcers noted.     Onychomycosis  Pain in right toes  Pain in left toes  Callus right foot.  Consent was obtained for treatment procedures.   Mechanical debridement of nails 1-5  bilaterally performed with a nail nipper.  Filed with dremel without incident.    Return office visit      3 months                Told patient to return for periodic foot care and evaluation due to potential at risk complications.   Helane Gunther DPM

## 2021-06-21 ENCOUNTER — Encounter (INDEPENDENT_AMBULATORY_CARE_PROVIDER_SITE_OTHER): Payer: Self-pay

## 2021-07-01 ENCOUNTER — Other Ambulatory Visit: Payer: Self-pay

## 2021-07-01 ENCOUNTER — Ambulatory Visit (INDEPENDENT_AMBULATORY_CARE_PROVIDER_SITE_OTHER): Payer: PPO | Admitting: *Deleted

## 2021-07-01 DIAGNOSIS — M216X2 Other acquired deformities of left foot: Secondary | ICD-10-CM

## 2021-07-01 DIAGNOSIS — M216X1 Other acquired deformities of right foot: Secondary | ICD-10-CM

## 2021-07-01 DIAGNOSIS — E0843 Diabetes mellitus due to underlying condition with diabetic autonomic (poly)neuropathy: Secondary | ICD-10-CM

## 2021-07-01 NOTE — Progress Notes (Signed)
Patient presents today to pick up diabetic shoes and insoles.  Patient was dispensed 1 pair of diabetic shoes and 3 pairs of foam casted diabetic insoles. Fit was satisfactory. Instructions for break-in and wear was reviewed and a copy was given to the patient.   Re-appointment for regularly scheduled diabetic foot care visits or if they should experience any trouble with the shoes or insoles.  

## 2021-07-08 DIAGNOSIS — Z6841 Body Mass Index (BMI) 40.0 and over, adult: Secondary | ICD-10-CM | POA: Diagnosis not present

## 2021-07-08 DIAGNOSIS — M461 Sacroiliitis, not elsewhere classified: Secondary | ICD-10-CM | POA: Diagnosis not present

## 2021-07-08 DIAGNOSIS — I10 Essential (primary) hypertension: Secondary | ICD-10-CM | POA: Diagnosis not present

## 2021-07-10 ENCOUNTER — Other Ambulatory Visit: Payer: Self-pay | Admitting: Neurosurgery

## 2021-07-10 DIAGNOSIS — M5440 Lumbago with sciatica, unspecified side: Secondary | ICD-10-CM

## 2021-07-31 DIAGNOSIS — M461 Sacroiliitis, not elsewhere classified: Secondary | ICD-10-CM | POA: Diagnosis not present

## 2021-08-14 ENCOUNTER — Other Ambulatory Visit: Payer: PPO

## 2021-08-14 ENCOUNTER — Other Ambulatory Visit: Payer: Self-pay

## 2021-08-20 ENCOUNTER — Other Ambulatory Visit: Payer: Self-pay

## 2021-08-20 ENCOUNTER — Ambulatory Visit: Payer: PPO | Admitting: Podiatry

## 2021-08-20 ENCOUNTER — Encounter: Payer: Self-pay | Admitting: Podiatry

## 2021-08-20 DIAGNOSIS — M216X1 Other acquired deformities of right foot: Secondary | ICD-10-CM | POA: Diagnosis not present

## 2021-08-20 DIAGNOSIS — Z79899 Other long term (current) drug therapy: Secondary | ICD-10-CM | POA: Insufficient documentation

## 2021-08-20 DIAGNOSIS — E0843 Diabetes mellitus due to underlying condition with diabetic autonomic (poly)neuropathy: Secondary | ICD-10-CM

## 2021-08-20 DIAGNOSIS — M461 Sacroiliitis, not elsewhere classified: Secondary | ICD-10-CM | POA: Insufficient documentation

## 2021-08-20 NOTE — Progress Notes (Signed)
   SUBJECTIVE Patient with a history of diabetes mellitus presents to office today for follow-up evaluation of her diabetic feet.  Last visit she was molded for diabetic shoes with insoles.  She says that the insoles do not provide the stability she needs.  She continues to be high risk for falls.  She has instability of her right foot especially since she has a cavovarus type deformity.  She presents for further treatment and evaluation   Past Medical History:  Diagnosis Date   Anxiety    Arthritis    knees, lower back, hands   Depression    Diabetes mellitus without complication (HCC)    Hyperlipidemia    Hypertension    on meds, reviewed by Dr. Jacinto Halim for cardiac clearance - 09/2013, told that she was cleared & no need to follow up with him    Hypothyroidism    Mental disorder    perimenopause use of Paxil   SVD (spontaneous vaginal delivery)    x 3   Thyroid disease    Total knee replacement status    Wears dentures    upper and lower    OBJECTIVE General Patient is awake, alert, and oriented x 3 and in no acute distress. Derm Skin is dry and supple bilateral. Negative open lesions or macerations. Remaining integument unremarkable. Nails are tender, long, thickened and dystrophic with subungual debris, consistent with onychomycosis, 1-5 bilateral. No signs of infection noted. Vasc  DP and PT pedal pulses palpable bilaterally. Temperature gradient within normal limits.  Neuro Epicritic and protective threshold sensation diminished bilaterally.  Musculoskeletal Exam Muscular strength within normal limits.  Cavovarus foot deformity noted to the right foot   ASSESSMENT 1. Diabetes Mellitus w/ peripheral neuropathy 2.  Pain in foot bilateral 3.  Cavus foot deformity right  PLAN OF CARE 1. Patient evaluated today. 2.  The patient continues to have instability to the foot and ankle despite the diabetic insoles and shoes.  Due to the cavus foot deformity of the right lower  extremity and increased instability and high risk of falls she would be a good candidate for an [place] brace 3.  Appointment with Pedorthist for custom molded [place] brace 4.  Continue diabetic shoes with insoles 5.  Return to clinic annually.  When the patient returns into the office we need to get updated x-rays bilateral feet.  Last x-rays taken 2019    Felecia Shelling, DPM [facility]  Dr. Felecia Shelling, DPM    [address] [place], [place] [address]                Office [phone]  Fax [phone]

## 2021-08-26 DIAGNOSIS — M461 Sacroiliitis, not elsewhere classified: Secondary | ICD-10-CM | POA: Diagnosis not present

## 2021-08-26 DIAGNOSIS — M5440 Lumbago with sciatica, unspecified side: Secondary | ICD-10-CM | POA: Diagnosis not present

## 2021-09-05 ENCOUNTER — Ambulatory Visit: Payer: PPO | Admitting: Podiatry

## 2021-09-05 ENCOUNTER — Telehealth: Payer: PPO | Admitting: Nurse Practitioner

## 2021-09-05 DIAGNOSIS — J00 Acute nasopharyngitis [common cold]: Secondary | ICD-10-CM

## 2021-09-05 MED ORDER — FLUTICASONE PROPIONATE 50 MCG/ACT NA SUSP: 2.0000 | Freq: Every day | NASAL | 6 refills | Status: AC

## 2021-09-05 MED ORDER — BENZONATATE 100 MG PO CAPS: 100.0000 mg | ORAL_CAPSULE | Freq: Three times a day (TID) | ORAL | 0 refills | Status: AC | PRN

## 2021-09-05 NOTE — Progress Notes (Signed)

## 2021-09-06 ENCOUNTER — Ambulatory Visit: Payer: Self-pay

## 2021-09-08 ENCOUNTER — Ambulatory Visit (HOSPITAL_BASED_OUTPATIENT_CLINIC_OR_DEPARTMENT_OTHER)
Admission: RE | Admit: 2021-09-08 | Discharge: 2021-09-08 | Disposition: A | Discharge status: Home / Self Care | Payer: PPO | Source: Ambulatory Visit | Attending: Emergency Medicine | Admitting: Emergency Medicine

## 2021-09-08 ENCOUNTER — Ambulatory Visit
Admission: RE | Admit: 2021-09-08 | Discharge: 2021-09-08 | Disposition: A | Discharge status: Home / Self Care | Payer: PPO | Source: Ambulatory Visit | Attending: Emergency Medicine | Admitting: Emergency Medicine

## 2021-09-08 ENCOUNTER — Other Ambulatory Visit: Payer: Self-pay

## 2021-09-08 VITALS — BP 145/87 | HR 61 | Temp 98.4°F | Resp 20

## 2021-09-08 DIAGNOSIS — R062 Wheezing: Secondary | ICD-10-CM | POA: Insufficient documentation

## 2021-09-08 DIAGNOSIS — R0989 Other specified symptoms and signs involving the circulatory and respiratory systems: Secondary | ICD-10-CM | POA: Diagnosis not present

## 2021-09-08 DIAGNOSIS — J189 Pneumonia, unspecified organism: Secondary | ICD-10-CM | POA: Diagnosis not present

## 2021-09-08 DIAGNOSIS — J3489 Other specified disorders of nose and nasal sinuses: Secondary | ICD-10-CM | POA: Diagnosis not present

## 2021-09-08 DIAGNOSIS — R0981 Nasal congestion: Secondary | ICD-10-CM | POA: Diagnosis not present

## 2021-09-08 DIAGNOSIS — R059 Cough, unspecified: Secondary | ICD-10-CM | POA: Insufficient documentation

## 2021-09-08 DIAGNOSIS — R051 Acute cough: Secondary | ICD-10-CM | POA: Diagnosis not present

## 2021-09-08 MED ORDER — AZITHROMYCIN 250 MG PO TABS: ORAL_TABLET | ORAL | 0 refills | Status: AC

## 2021-09-08 MED ORDER — AMOXICILLIN 500 MG PO CAPS: 1000.0000 mg | ORAL_CAPSULE | Freq: Three times a day (TID) | ORAL | 0 refills | Status: AC

## 2021-09-08 NOTE — ED Triage Notes (Signed)
Pt reports having cough, congestion and sore throat. Started: about 5 days ago

## 2021-09-08 NOTE — ED Provider Notes (Signed)
[facility]    CSN: [id number] Arrival date & time: 09/08/21  1029   History   Chief Complaint No chief complaint on file.  HPI Margaret [name] is a 60 y.o. female. Patient reports a 5-day history of cough, congestion and sore throat.  Patient reports a history of strep throat in the past however had her tonsils removed at age 34 and has not had issues with this since then.  Patient states that her throat does not feel like strep at this time.  She states she is also had some body ache some chill and possible subjective fever.  Patient denies history of reactive airway disease, allergies, asthma as a child.  Patient states she reached out to your primary care provider a few days ago and was provided with a prescription for Tessalon Perles and Flonase which she has been taking.  Patient states she does not feel the Jerilynn Som are helping much and has not noticed that the Flonase has helped much either.  Patient states she has been doing a little bit better with Delsym but has also been taking DayQuil and plain Robitussin.  Patient states cough varies throughout the day, does not feel that it is particularly worse at night.  Patient states that sometimes the cough is very barky and can make her little bit short of breath.  Patient's O2 sat is 95% on arrival today, blood pressure is elevated.  Patient denies nausea, vomiting, diarrhea, headache.  Patient states she noticed she had a little bit of laryngitis yesterday but that is improved today.  The history is provided by the patient.   Past Medical History:  Diagnosis Date   Anxiety    Arthritis    knees, lower back, hands   Depression    Diabetes mellitus without complication (HCC)    Hyperlipidemia    Hypertension    on meds, reviewed by Dr. Jacinto Halim for cardiac clearance - 09/2013, told that she was cleared & no need to follow up with him    Hypothyroidism    Mental disorder    perimenopause use of Paxil   SVD (spontaneous  vaginal delivery)    x 3   Thyroid disease    Total knee replacement status    Wears dentures    upper and lower   Patient Active Problem List   Diagnosis Date Noted   Encounter for long-term (current) use of other medications 08/20/2021   Inflammation of sacroiliac joint (HCC) 08/20/2021   Vitamin D deficiency 05/08/2021   Lumbago with sciatica, unspecified side 11/05/2020   HNP (herniated nucleus pulposus), lumbar 09/25/2020   Type 2 diabetes mellitus without complication, without long-term current use of insulin (HCC) 09/11/2020   Opioid dependence (HCC) 08/19/2020   Other long term (current) drug therapy 08/19/2020   Body mass index (BMI) 60.0-69.9, adult (HCC) 06/04/2020   OAB (overactive bladder) 02/20/2020   Lumbar foraminal stenosis 02/13/2020   Mild depression 07/21/2019   Primary osteoarthritis of both hands 06/14/2017   Status post total knee replacement, bilateral 05/17/2017   DDD (degenerative disc disease), lumbar 05/17/2017   Backache 11/22/2013   Leukocytosis 03/25/2013   Abnormal electrocardiogram 03/25/2013   Failed total right knee replacement (HCC) 11/10/2012   Obesity, Class III, BMI 40-49.9 (morbid obesity) (HCC) 11/10/2012   Endometrial polyp 09/23/2012    Class: Present on Admission   Hot flushes, perimenopausal 08/03/2012   Menorrhagia 07/21/2012   Menopause 07/13/2012   Other and unspecified hyperlipidemia 07/13/2012  Bereavement 07/13/2012   Essential hypertension, benign 07/13/2012   History of migraine 07/13/2012   Hypothyroidism 07/13/2012   Past Surgical History:  Procedure Laterality Date   BACK SURGERY     CARPAL TUNNEL RELEASE     2000, 2001, both hands   DILATATION & CURETTAGE/HYSTEROSCOPY WITH MYOSURE N/A 05/25/2018   Procedure: DILATATION & CURETTAGE/HYSTEROSCOPY WITH MYOSURE;  Surgeon: Arvella Nigh, MD;  Location: [facility];  Service: Gynecology;  Laterality: N/A;   DILATATION & CURRETTAGE/HYSTEROSCOPY WITH RESECTOCOPE  09/23/2012    Procedure: DILATATION & CURETTAGE/HYSTEROSCOPY WITH RESECTOCOPE;  Surgeon: Darlyn Chamber, MD;  Location: [facility];  Service: Gynecology;  Laterality: N/A;   HAND SURGERY Bilateral    carpel tunnel surgery- bilateral   JOINT REPLACEMENT  2000, 2012,   knee replacement bilateral left-2000 & 2012, right-2002   MULTIPLE TOOTH EXTRACTIONS     upper and lower dentures   TONSILLECTOMY     TOTAL KNEE REVISION  11/08/2012   Procedure: TOTAL KNEE REVISION;  Surgeon: Garald Balding, MD;  Location: [facility];  Service: Orthopedics;  Laterality: Right;  Revision Right Total Knee Replacement    TUBAL LIGATION     VAGINAL BIRTH AFTER CESAREAN SECTION     83, 85,87 - all vaginal - no c/s per patient   VAGINAL DELIVERY  83, 85, 87   x 3   OB History     Gravida  3   Para      Term      Preterm      AB      Living  3      SAB      IAB      Ectopic      Multiple      Live Births             Home Medications    Prior to Admission medications   Medication Sig Start Date End Date Taking? Authorizing Provider  amoxicillin (AMOXIL) 500 MG capsule Take 2 capsules (1,000 mg total) by mouth 3 (three) times daily for 5 days. 09/08/21 09/13/21 Yes Lynden Oxford Scales, PA-C  azithromycin (ZITHROMAX) 250 MG tablet Take 2 tablets (500 mg total) by mouth daily for 1 day, THEN 1 tablet (250 mg total) daily for 4 days. 09/08/21 09/13/21 Yes Lynden Oxford Scales, PA-C  AgaMatrix Ultra-Thin Lancets MISC Check blood sugar once daily. Dx E11.9 (OneTouch Verio) 11/18/20   [provider]  atorvastatin (LIPITOR) 20 MG tablet Take 20 mg by mouth at bedtime. 08/01/21   [provider]  benzonatate (TESSALON PERLES) 100 MG capsule Take 1 capsule (100 mg total) by mouth 3 (three) times daily as needed. 09/05/21   Hassell Done, Mary-Margaret, FNP  Blood Glucose Monitoring Suppl (GLUCOCOM BLOOD GLUCOSE MONITOR) DEVI Check blood sugar once daily. Dx E11.9 11/18/20   [provider]   buPROPion (WELLBUTRIN XL) 150 MG 24 hr tablet Take 150 mg by mouth daily at 2 PM. Take at 2pm 08/16/20   [provider]  buPROPion (WELLBUTRIN XL) 150 MG 24 hr tablet Take 1 tablet by mouth every morning. 08/20/20   [provider]  buPROPion (WELLBUTRIN XL) 300 MG 24 hr tablet Take 300 mg by mouth daily with breakfast.    [provider]  calcitRIOL (ROCALTROL) 0.5 MCG capsule Take 1 mcg by mouth daily. 08/16/20   [provider]  Cholecalciferol (VITAMIN D) 125 MCG (5000 UT) CAPS Take 5,000 Units by mouth daily.    [provider]  diphenhydrAMINE (SOMINEX) 25 MG tablet Take 25 mg by mouth at bedtime as needed for allergies. sleep    [provider]  DULoxetine (CYMBALTA) 60 MG capsule Take 60 mg by mouth at bedtime. 08/16/20   [provider]  DULoxetine (CYMBALTA) 60 MG capsule Take 1 tablet by mouth daily. 08/18/21   [provider]  estradiol (ESTRACE) 1 MG tablet Take 1 mg by mouth daily.     [provider]  fenofibrate 54 MG tablet Take 108 mg by mouth at bedtime.     [provider]  fluticasone (FLONASE) 50 MCG/ACT nasal spray Place 2 sprays into both nostrils daily. 09/05/21   Daphine Deutscher Mary-Margaret, FNP  furosemide (LASIX) 20 MG tablet Take 20 mg by mouth 2 (two) times daily with breakfast and lunch. 09/09/20   [provider]  glucose blood (ONETOUCH VERIO) test strip Check blood sugar once daily. Dx E11.9 11/18/20   [provider]  HYDROcodone-acetaminophen (NORCO/VICODIN) 5-325 MG tablet Take 1 tablet by mouth 2 (two) times daily as needed for up to 5 days for moderate pain or severe pain. Patient taking differently: Take 1 tablet by mouth every 6 (six) hours.  11/03/19 09/13/20  Tyrell Antonio, MD  ibuprofen (ADVIL,MOTRIN) 200 MG tablet Take 600 mg by mouth every 6 (six) hours as needed for moderate pain.    [provider]  levothyroxine (SYNTHROID) 25 MCG tablet  Take 25 mcg by mouth every Monday, Wednesday, and Friday. Takes with on MWF for a total of 09/10/20   [provider]  levothyroxine (SYNTHROID, LEVOTHROID) 200 MCG tablet Take 200 mcg by mouth daily before breakfast.     [provider]  lisinopril (ZESTRIL) 20 MG tablet Take 20 mg by mouth daily. 06/12/20   [provider]  lisinopril (ZESTRIL) 20 MG tablet Take 1 tablet by mouth daily. 08/18/21   [provider]  meloxicam (MOBIC) 15 MG tablet Take 15 mg by mouth daily. 07/05/20   [provider]  meloxicam (MOBIC) 15 MG tablet Take 1 tablet by mouth daily. 08/20/20   [provider]  Menthol, Topical Analgesic, (ICY HOT ADVANCED RELIEF EX) Apply 1 application topically 2 (two) times daily as needed (pain).    [provider]  metFORMIN (GLUCOPHAGE-XR) 500 MG 24 hr tablet Take 500 mg by mouth daily with breakfast. 08/16/20   [provider]  methocarbamol (ROBAXIN) 500 MG tablet Take 1,000 mg by mouth 4 (four) times daily as needed for muscle spasms. 08/14/20   [provider]  methocarbamol (ROBAXIN) 500 MG tablet Take 1 tablet (500 mg total) by mouth 4 (four) times daily. 09/26/20   Meyran, Tiana Loft, NP  methocarbamol (ROBAXIN) 500 MG tablet Take 1 tablet by mouth 4 (four) times daily. 03/26/21   [provider]  metoprolol succinate (TOPROL-XL) 50 MG 24 hr tablet Take 50 mg by mouth daily.  05/05/17   [provider]  oxybutynin (DITROPAN-XL) 10 MG 24 hr tablet Take 10 mg by mouth at bedtime. 07/24/20   [provider]  oxybutynin (DITROPAN-XL) 10 MG 24 hr tablet Take by mouth.    [provider]  oxyCODONE-acetaminophen (PERCOCET) 5-325 MG tablet Take 1 tablet by mouth every 4 (four) hours as needed for severe pain. 09/26/20 09/26/21  Meyran, Tiana Loft, NP  pregabalin (LYRICA) 75 MG capsule TAKE 1 CAPSULE BY MOUTH TWICE A DAY Patient taking differently: Take 75  mg by mouth 2 (two) times daily.  04/30/20  Magnus Sinning, MD  pregabalin (LYRICA) 75 MG capsule Take 1 capsule by mouth 2 (two) times daily. 05/23/21   [provider]  progesterone (PROMETRIUM) 100 MG capsule Take 100 mg by mouth at bedtime. 07/20/20   [provider]  sitaGLIPtin (JANUVIA) 100 MG tablet  05/28/21   [provider]  TRULICITY [id number] SOPN SMARTSIG:0.5 Milliliter(s) SUB-Q Once a Week 05/25/21   [provider]  Turmeric 500 MG TABS Take 1,000 mg by mouth daily.    [provider]   Family History Family History  Problem Relation Age of Onset   Hyperlipidemia Mother    Hypertension Mother    Heart disease Mother    Kidney disease Mother    Rheumatologic disease Mother    Heart disease Father    Cancer Father    Heart disease Sister    Hypertension Sister    Social History Social History   Tobacco Use   Smoking status: Former    Packs/day: 1.00    Years: 25.00    Pack years: 25.00    Types: Cigarettes    Quit date: 07/13/2008    Years since quitting: 13.1   Smokeless tobacco: Never  Vaping Use   Vaping Use: Never used  Substance Use Topics   Alcohol use: No   Drug use: No   Allergies   Codeine  Review of Systems Review of Systems Pertinent findings noted in history of present illness.   Physical Exam Triage Vital Signs ED Triage Vitals  Enc Vitals Group     BP 08/15/21 0827 (!) 147/82     Pulse Rate 08/15/21 0827 72     Resp 08/15/21 0827 18     Temp 08/15/21 0827 98.3 F (36.8 C)     Temp Source 08/15/21 0827 Oral     SpO2 08/15/21 0827 98 %     Weight --      Height --      Head Circumference --      Peak Flow --      Pain Score 08/15/21 0826 5     Pain Loc --      Pain Edu? --      Excl. in [place]? --    No data found.  Updated Vital Signs BP (!) 145/87 (BP Location: Right Arm)   Pulse 61   Temp 98.4 F (36.9 C) (Oral)   Resp 20   LMP  (LMP Unknown)   SpO2 95%   Visual  Acuity Right Eye Distance:   Left Eye Distance:   Bilateral Distance:    Right Eye Near:   Left Eye Near:    Bilateral Near:     Physical Exam Vitals and nursing note reviewed.  Constitutional:      General: She is not in acute distress.    Appearance: Normal appearance. She is not ill-appearing.  HENT:     Head: Normocephalic and atraumatic.     Salivary Glands: Right salivary gland is not diffusely enlarged or tender. Left salivary gland is not diffusely enlarged or tender.     Right Ear: Tympanic membrane, ear canal and external ear normal. No drainage. No middle ear effusion. There is no impacted cerumen. Tympanic membrane is not erythematous or bulging.     Left Ear: Tympanic membrane, ear canal and external ear normal. No drainage.  No middle ear effusion. There is no impacted cerumen. Tympanic membrane is not erythematous or bulging.     Nose: Nose normal. No  nasal deformity, septal deviation, mucosal edema, congestion or rhinorrhea.     Right Turbinates: Not enlarged, swollen or pale.     Left Turbinates: Not enlarged, swollen or pale.     Right Sinus: No maxillary sinus tenderness or frontal sinus tenderness.     Left Sinus: No maxillary sinus tenderness or frontal sinus tenderness.     Mouth/Throat:     Lips: Pink. No lesions.     Mouth: Mucous membranes are moist. No oral lesions.     Pharynx: Oropharynx is clear. Uvula midline. No posterior oropharyngeal erythema or uvula swelling.     Tonsils: No tonsillar exudate. 0 on the right. 0 on the left.  Eyes:     General: Lids are normal.        Right eye: No discharge.        Left eye: No discharge.     Extraocular Movements: Extraocular movements intact.     Conjunctiva/sclera: Conjunctivae normal.     Right eye: Right conjunctiva is not injected.     Left eye: Left conjunctiva is not injected.  Neck:     Trachea: Trachea and phonation normal.  Cardiovascular:     Rate and Rhythm: Normal rate and regular rhythm.      Pulses: Normal pulses.     Heart sounds: Normal heart sounds. No murmur heard.   No friction rub. No gallop.  Pulmonary:     Effort: Pulmonary effort is normal. No accessory muscle usage, prolonged expiration or respiratory distress.     Breath sounds: No stridor, decreased air movement or transmitted upper airway sounds. Examination of the left-upper field reveals decreased breath sounds and wheezing. Examination of the left-middle field reveals decreased breath sounds. Decreased breath sounds and wheezing present. No rhonchi or rales.  Chest:     Chest wall: No tenderness.  Musculoskeletal:        General: Normal range of motion.     Cervical back: Normal range of motion and neck supple. Normal range of motion.  Lymphadenopathy:     Cervical: No cervical adenopathy.  Skin:    General: Skin is warm and dry.     Findings: No erythema or rash.  Neurological:     General: No focal deficit present.     Mental Status: She is alert and oriented to person, place, and time.  Psychiatric:        Mood and Affect: Mood normal.        Behavior: Behavior normal.   UC Treatments / Results  Labs (all labs ordered are listed, but only abnormal results are displayed)  Labs Reviewed - No data to display  EKG  Radiology DG Chest 2 View  Result Date: 09/08/2021 CLINICAL DATA:  Wheezing with decreased breath sounds and cough for 5 days. Possible pneumonia. EXAM: CHEST - 2 VIEW COMPARISON:  Radiographs 11/02/2012. FINDINGS: The heart size is at the upper limits of normal. The mediastinal contours are normal. The lungs appear clear. There is no pleural effusion or pneumothorax. Degenerative changes are present throughout the spine. IMPRESSION: No evidence of pneumonia or other acute chest process. Borderline heart size. Electronically Signed   By: Richardean Sale M.D.   On: 09/08/2021 12:59    Procedures Procedures (including critical care time)  Medications Ordered in UC Medications - No data to  display  Initial Impression / Assessment and Plan / UC Course  I have reviewed the triage vital signs and the nursing notes.  Pertinent labs & imaging results  that were available during my care of the patient were reviewed by me and considered in my medical decision making (see chart for details).      Patient does not appear to be in respiratory distress at this time, patient is able to speak in complete sentences but does have intermittent coughing throughout the entire visit.  Patient also demonstrates excessive throat clearing.  Patient appears to have a significant amount of nasal congestion as well.  Given decreased breath sounds and mild wheezing in the left upper lung fields, suspect patient has community acquired pneumonia at this point.  X-ray recommended and ordered for patient.  Patient also advised to begin dual course antibiotics for presumed acute immunity acquired pneumonia.  Patient also advised that she can continue OTC meds as well as her Tessalon Perles and Flonase as needed.  Return precautions advised.  Final Clinical Impressions(s) / UC Diagnoses   Final diagnoses:  Nasal congestion with rhinorrhea  Acute cough  Pneumonia of left upper lobe due to infectious organism     Discharge Instructions      Based on the duration of your symptoms and my physical exam findings, I do recommend that you have an x-ray done to evaluate for possible pneumonia.  This has been ordered at the [facility] location, you can have this done at your option.  As we discussed, it will not change my recommended treatment plan but it would be good information for future reference.  For presumed community-acquired pneumonia, treatment guidelines recommend that you begin a 5-day course of dual antibiotic therapy, amoxicillin 1 g 3 times daily for 5 days and azithromycin, 2 tablets for your first dose then 1 tablet for the next 4 doses for total of 5 days.  These prescriptions have been sent  to your pharmacy.  It would be good if you could begin taking both today.  Also per treatment guidelines guidelines, steroids are not recommended and may actually worsen symptoms so I will not be prescribing those today.  Please feel free to continue all of the medications that you have been currently taking, they are all safe to take with these 2 antibiotics.  You may find that over the next several days some of these medications begin to work better than others, in particular Mucinex/Robitussin may be of more benefit to help you clear phlegm and congestion from your lungs.  Ibuprofen may also reduce inflammation in your lungs and make your work of coughing a little easier.  As we discussed, simple exercises such as blowing bubbles or blowing up balloons will also hasten recovery, so will gently walking 10 to 15 minutes several times a day.  Please be sure you follow-up with your primary care provider in the next 2 to 3 days for possible reevaluation and certainly follow-up with him if your symptoms worsen while taking antibiotics.  Thank you for visiting urgent care today, I hope you feel better soon.     ED Prescriptions     Medication Sig Dispense Auth. Provider   amoxicillin (AMOXIL) 500 MG capsule Take 2 capsules (1,000 mg total) by mouth 3 (three) times daily for 5 days. 30 capsule Lynden Oxford Scales, PA-C   azithromycin (ZITHROMAX) 250 MG tablet Take 2 tablets (500 mg total) by mouth daily for 1 day, THEN 1 tablet (250 mg total) daily for 4 days. 6 tablet Lynden Oxford Scales, PA-C      PDMP not reviewed this encounter.  Disposition Upon Discharge:  Patient presented  with an acute illness with associated systemic symptoms and significant discomfort requiring urgent management. In my opinion, this is a condition that a prudent lay person (someone who possesses an average knowledge of health and medicine) may potentially expect to result in complications if not addressed  urgently such as respiratory distress, impairment of bodily function or dysfunction of bodily organs.   Routine symptom specific, illness specific and/or disease specific instructions were discussed with the patient and/or caregiver at length.   As such, the patient has been evaluated and assessed, work-up was performed and treatment was provided in alignment with urgent care protocols and evidence based medicine.  Patient/parent/caregiver has been advised that the patient may require follow up for further testing and treatment if the symptoms continue in spite of treatment, as clinically indicated and appropriate.  Patient was tested for COVID-19, Influenza and/or RSV, the patient/parent/guardian was advised to isolate at home pending the results of his/her diagnostic coronavirus test and potentially longer if they're positive. Advised pt that if his/her COVID-19 test returns positive, it's recommended to self-isolate for at least 10 days after symptoms first appeared AND until fever-free for 24 hours without fever reducer AND other symptoms have improved or resolved. Discussed self-isolation recommendations as well as instructions for household member/close contacts as per the [facility] and [place] DHHS, and also gave patient the [place] packet with this information.  Patient/parent/caregiver has been advised to return to the [facility] or PCP in 3-5 days if no better; to PCP or the Emergency Department if new signs and symptoms develop, or if the current signs or symptoms continue to change or worsen for further workup, evaluation and treatment as clinically indicated and appropriate  The patient will follow up with their current PCP if and as advised. If the patient does not currently have a PCP we will assist them in obtaining one.   The patient may need specialty follow up if the symptoms continue, in spite of conservative treatment and management, for further workup, evaluation, consultation and treatment as  clinically indicated and appropriate.  Patient/parent/caregiver verbalized understanding and agreement of plan as discussed.  All questions were addressed during visit.  Please see discharge instructions below for further details of plan.  Condition: stable for discharge home Home: take medications as prescribed; routine discharge instructions as discussed; follow up as advised.'   Lynden Oxford Scales, PA-C 09/08/21 1343

## 2021-09-08 NOTE — Discharge Instructions (Addendum)
Based on the duration of your symptoms and my physical exam findings, I do recommend that you have an x-ray done to evaluate for possible pneumonia.  This has been ordered at the [facility] location, you can have this done at your option.  As we discussed, it will not change my recommended treatment plan but it would be good information for future reference.  For presumed community-acquired pneumonia, treatment guidelines recommend that you begin a 5-day course of dual antibiotic therapy, amoxicillin 1 g 3 times daily for 5 days and azithromycin, 2 tablets for your first dose then 1 tablet for the next 4 doses for total of 5 days.  These prescriptions have been sent to your pharmacy.  It would be good if you could begin taking both today.  Also per treatment guidelines guidelines, steroids are not recommended and may actually worsen symptoms so I will not be prescribing those today.  Please feel free to continue all of the medications that you have been currently taking, they are all safe to take with these 2 antibiotics.  You may find that over the next several days some of these medications begin to work better than others, in particular Mucinex/Robitussin may be of more benefit to help you clear phlegm and congestion from your lungs.  Ibuprofen may also reduce inflammation in your lungs and make your work of coughing a little easier.  As we discussed, simple exercises such as blowing bubbles or blowing up balloons will also hasten recovery, so will gently walking 10 to 15 minutes several times a day.  Please be sure you follow-up with your primary care provider in the next 2 to 3 days for possible reevaluation and certainly follow-up with him if your symptoms worsen while taking antibiotics.  Thank you for visiting urgent care today, I hope you feel better soon.

## 2021-09-09 ENCOUNTER — Ambulatory Visit: Payer: PPO

## 2021-09-16 ENCOUNTER — Other Ambulatory Visit: Payer: PPO

## 2021-09-24 ENCOUNTER — Ambulatory Visit: Payer: PPO

## 2021-09-24 ENCOUNTER — Other Ambulatory Visit: Payer: Self-pay

## 2021-09-24 DIAGNOSIS — M216X1 Other acquired deformities of right foot: Secondary | ICD-10-CM

## 2021-09-24 NOTE — Progress Notes (Signed)
SITUATION Patient Name:  Margaret [name] MRN:   [id number] Reason for Visit: Evaluation for Speciality AFO  Patient Report: Chief Complaint:   Patient's ankle rolls over Ashby Dawes of Discomfort/Pain:  Ambulatory Standing Location:    right lower extremity Onset & Duration:   Gradual and Present longer than 3 months Course:    gradually worsening Aggravating or Alleviating Factors: Standing and ambulation  OBJECTIVE DATA & MEASUREMENTS Prognosis:    Good Duration of use:   5 years  Diagnosis: Acquired cavovarus deformity of foot, right  Stance Evaluation: Angle of Gait:    Bilateral trendelenberg Base of Gait:    Wide Foot Appearance:   Pes Cavovarus Tibial Influence:   0 degrees Relaxed Calcaneal Stance Position: Varus Neutral Calcaneal Stance Position: Varus   GOALS, NECESSITIES, & JUSTIFICATIONS Recommended Device: [place] Gauntlet Tall Color:    Black Closure:   Laces  Laterality HCPCS Code Description Justification  right [id number] Plastic orthosis, custom molded from a model of the patient, custom fabricated, includes casting and cast preparation. Necessary to provide triplanar support to the foot/ankle complex  right L2330 Addition to lower extremity, lacer molded to patient model Necessary to ensure secure hold of orthosis to patient's limb  right L2820 Addition to lower extremity orthosis, soft interface for molded plastic below knee section Necessary to relieve pressure on bony prominences    I certify that Margaret [name] qualifies for and will benefit from an ankle foot orthosis used during ambulation based on meeting all of the following criteria;   The patient is: - Ambulatory, and - Has weakness or deformity of the foot and ankle, and - Requires stabilization for medical reasons, and - Has the potential to benefit functionally  The patient's medical record contains sufficient documentation of the patients medical condition to substantiate the necessity for the  type and quantity of the items ordered.  The goals of this therapy: - Improve Mobility - Improve Lower Extremity Stability - Decrease Pain - Facilitate Soft Tissue Healing - Facilitate Immobilization, healing and treatment of an injury  Necessity of Ankle Foot Orthotic molded to patient model: A custom (vs. prefabricated) ankle foot orthosis has been prescribed based on the following criteria which are specific to the condition of this patient; - The patient could not be fit with a prefabricated AFO - The condition necessitating the orthosis is expected to be permanent or of longstanding duration (more than 6 months) - There is need to control the ankle or foot in more than one plane - The patient has a documented neurological, circulatory, or orthopedic condition that requires custom fabrication over a model to prevent tissue injury - The patient has a healing fracture that lacks normal anatomical integrity or anthropometric proportions  I hereby certify that the ankle foot orthotic described above is a rigid or semi-rigid device which is used for the purpose of supporting a weak or deformed body member or restricting or eliminating motion in a diseased or injured part of the body. It is designed to provide support and counterforce on the limb or body part that is being braced. In my opinion, the custom molded ankle foot orthosis is both reasonable and necessary in reference to accepted standards of medical practice in the treatment  of the patient condition and rehabilitation.  ACTIONS PERFORMED Patient was evaluated and casted for Specialty AFO via [employer]. Procedure was explained to patient. Patient tolerated procedure. patient selected device color and closure method.   PLAN Patient to return  in four to six weeks for fitting and delivery of device. Plan of care was explained to and agreed upon by patient. All questions were answered and concerns addressed.

## 2021-10-01 ENCOUNTER — Ambulatory Visit
Admission: RE | Admit: 2021-10-01 | Discharge: 2021-10-01 | Disposition: A | Discharge status: Home / Self Care | Payer: PPO | Source: Ambulatory Visit | Attending: Neurosurgery | Admitting: Neurosurgery

## 2021-10-01 ENCOUNTER — Other Ambulatory Visit: Payer: Self-pay

## 2021-10-01 DIAGNOSIS — M545 Low back pain, unspecified: Secondary | ICD-10-CM | POA: Diagnosis not present

## 2021-10-01 DIAGNOSIS — M5440 Lumbago with sciatica, unspecified side: Secondary | ICD-10-CM

## 2021-10-07 DIAGNOSIS — M461 Sacroiliitis, not elsewhere classified: Secondary | ICD-10-CM | POA: Diagnosis not present

## 2021-10-07 DIAGNOSIS — M5440 Lumbago with sciatica, unspecified side: Secondary | ICD-10-CM | POA: Diagnosis not present

## 2021-10-08 ENCOUNTER — Other Ambulatory Visit: Payer: Self-pay | Admitting: Neurosurgery

## 2021-10-08 DIAGNOSIS — M5440 Lumbago with sciatica, unspecified side: Secondary | ICD-10-CM

## 2021-10-24 ENCOUNTER — Telehealth: Payer: Self-pay | Admitting: Podiatry

## 2021-10-24 NOTE — Telephone Encounter (Signed)
Received [employer] Auth # [id number] for brace cpt [id number] x1 ,L2330 x1. Valid 12.19.2022 thru 3.18.2023

## 2021-11-06 ENCOUNTER — Telehealth: Payer: Self-pay | Admitting: Podiatry

## 2021-11-06 NOTE — Telephone Encounter (Signed)
Brace in.lvm for pt to call to schedule an appt to pick it up.. °

## 2021-11-07 ENCOUNTER — Ambulatory Visit
Admission: RE | Admit: 2021-11-07 | Discharge: 2021-11-07 | Disposition: A | Discharge status: Home / Self Care | Payer: PPO | Source: Ambulatory Visit | Attending: Neurosurgery | Admitting: Neurosurgery

## 2021-11-07 ENCOUNTER — Other Ambulatory Visit: Payer: Self-pay

## 2021-11-07 DIAGNOSIS — M5440 Lumbago with sciatica, unspecified side: Secondary | ICD-10-CM

## 2021-11-12 ENCOUNTER — Other Ambulatory Visit: Payer: Self-pay

## 2021-11-12 ENCOUNTER — Ambulatory Visit: Payer: PPO

## 2021-11-12 DIAGNOSIS — E0843 Diabetes mellitus due to underlying condition with diabetic autonomic (poly)neuropathy: Secondary | ICD-10-CM

## 2021-11-12 DIAGNOSIS — M216X1 Other acquired deformities of right foot: Secondary | ICD-10-CM | POA: Diagnosis not present

## 2021-11-12 NOTE — Progress Notes (Signed)
SITUATION Reason for Visit: Dispensation and Fitting of Custom Molded Gauntlet Patient Report: Patient reports comfort in ambulation and understands all instructions.  OBJECTIVE DATA Patient History / Diagnosis:     ICD-10-CM   1. Acquired cavovarus deformity of foot, right  M21.6X1     2. Diabetes mellitus due to underlying condition with diabetic autonomic neuropathy, unspecified whether long term insulin use (HCC)  E08.43       Provided Device:  Custom Molded Gauntlet: Style [place] Tall Lace  Goals of Orthosis: - Improve gait - Decrease energy expenditure during the gait cycle - Improve balance - Stabilize motion at ankle and subtalar joint - Compensate for muscle weakness - Facilitate motion - Provide triplanar ankle and foot stabilization for weight bearing activities  Device Justification: - Patient is ambulatory  - Device is medically necessary as part of the overall treatment due to the patient's condition and related symptoms - It is anticipated that the patient will benefit functionally with use of the device.  - The custom device is utilized in an attempt to avoid the need for surgery and because a prefabricated device is inappropriate.  Upon gait analysis, the device appeared to be fitting well and the patient states that the device is comfortable.  ACTIONS PERFORMED Patient was fit with Architectural technologist. Patient tolerated fitting procedure. Fit of the device is good. Patient was able to apply properly and ambulate without distress. Device function is to restrict and limit motion and provide stabilization in the ankle joint.   Goals and function of this device were explained in detail to the patient. The patient was shown how to properly apply, wear, and care for the device. It was explained that the device will fit and function best in an adjustable-closure shoe with a firm heel counter and a wide base of support. When the device was dispensed, it was  suitable for the patient's condition and not substandard. No guarantees were given. Precautions were reviewed.   Written instructions, warranty information, and a copy of DMEPOS Supplier Standards were provided. All questions answered and concerns addressed.  PLAN Patient is to follow up in one week or as necessary (PRN). Plan of care was discussed with and agreed upon by patient.

## 2021-11-24 ENCOUNTER — Telehealth: Payer: Self-pay | Admitting: Podiatry

## 2021-11-24 NOTE — Telephone Encounter (Signed)
Pt left message Friday stating she is having a hard time with the brace she received due to swelling. She has not been able to wear it much because she is having swelling almost daily. She would like to know what would you recommend? She understands it will help if she can wear it.

## 2021-12-04 ENCOUNTER — Ambulatory Visit: Payer: PPO

## 2021-12-04 ENCOUNTER — Other Ambulatory Visit: Payer: Self-pay

## 2021-12-04 DIAGNOSIS — E0843 Diabetes mellitus due to underlying condition with diabetic autonomic (poly)neuropathy: Secondary | ICD-10-CM

## 2021-12-04 DIAGNOSIS — M216X1 Other acquired deformities of right foot: Secondary | ICD-10-CM

## 2021-12-04 NOTE — Progress Notes (Signed)
SITUATION Reason for Consult: Follow-up with [place] gauntlet Patient / Caregiver Report: Patient reports she has difficulty donning and doffing  OBJECTIVE DATA History / Diagnosis:    ICD-10-CM   1. Diabetes mellitus due to underlying condition with diabetic autonomic neuropathy, unspecified whether long term insulin use (HCC)  E08.43     2. Acquired cavovarus deformity of foot, right  M21.6X1       Change in Pathology: None  ACTIONS PERFORMED Patient's equipment was checked for structural stability and fit. Reinstructed patient in donning and doffing. Answered all questions and concerns.  Provided shorter shoelace. Device(s) intact and fit is excellent. All questions answered and concerns addressed.  PLAN Follow-up as needed (PRN). Plan of care discussed with and agreed upon by patient / caregiver.

## 2021-12-16 ENCOUNTER — Other Ambulatory Visit: Payer: Self-pay

## 2021-12-16 ENCOUNTER — Ambulatory Visit: Payer: PPO

## 2021-12-16 DIAGNOSIS — M216X1 Other acquired deformities of right foot: Secondary | ICD-10-CM

## 2021-12-16 NOTE — Progress Notes (Signed)
SITUATION Reason for Consult: Follow-up with right [place] AFO Patient / Caregiver Report: Patient reports right leg swells at the top of the brace  OBJECTIVE DATA History / Diagnosis:    ICD-10-CM   1. Acquired cavovarus deformity of foot, right  M21.6X1       Change in Pathology: None  ACTIONS PERFORMED Patient's equipment was checked for structural stability and fit. Developed alternative lacing pattern. Recommended patient leave the top three eyelets unlaced. Patient reports comfort and is satisfied. Device(s) intact and fit is excellent. All questions answered and concerns addressed.  PLAN Follow-up as needed (PRN). Plan of care discussed with and agreed upon by patient / caregiver.

## 2021-12-24 ENCOUNTER — Ambulatory Visit: Payer: PPO | Admitting: Podiatry

## 2021-12-24 IMAGING — RF DG LUMBAR SPINE 2-3V
1 series · 2 of 2 positions shown · non-contrast
Comparison: Lumbar spine MRI 07/24/2020. Lumbar spine radiographs
07/30/2020.

CLINICAL DATA: Surgery, elective. Additional history provided:
Posterior lumbar interbody fusion lumbar 4-5. Provided fluoroscopy
time 1 minutes, 17 seconds (135.36 mGy).

EXAM:
LUMBAR SPINE - 2-3 VIEW; DG C-ARM 1-60 MIN

[Series 1: run · 2 of 2 slices shown]
[im 1/2]
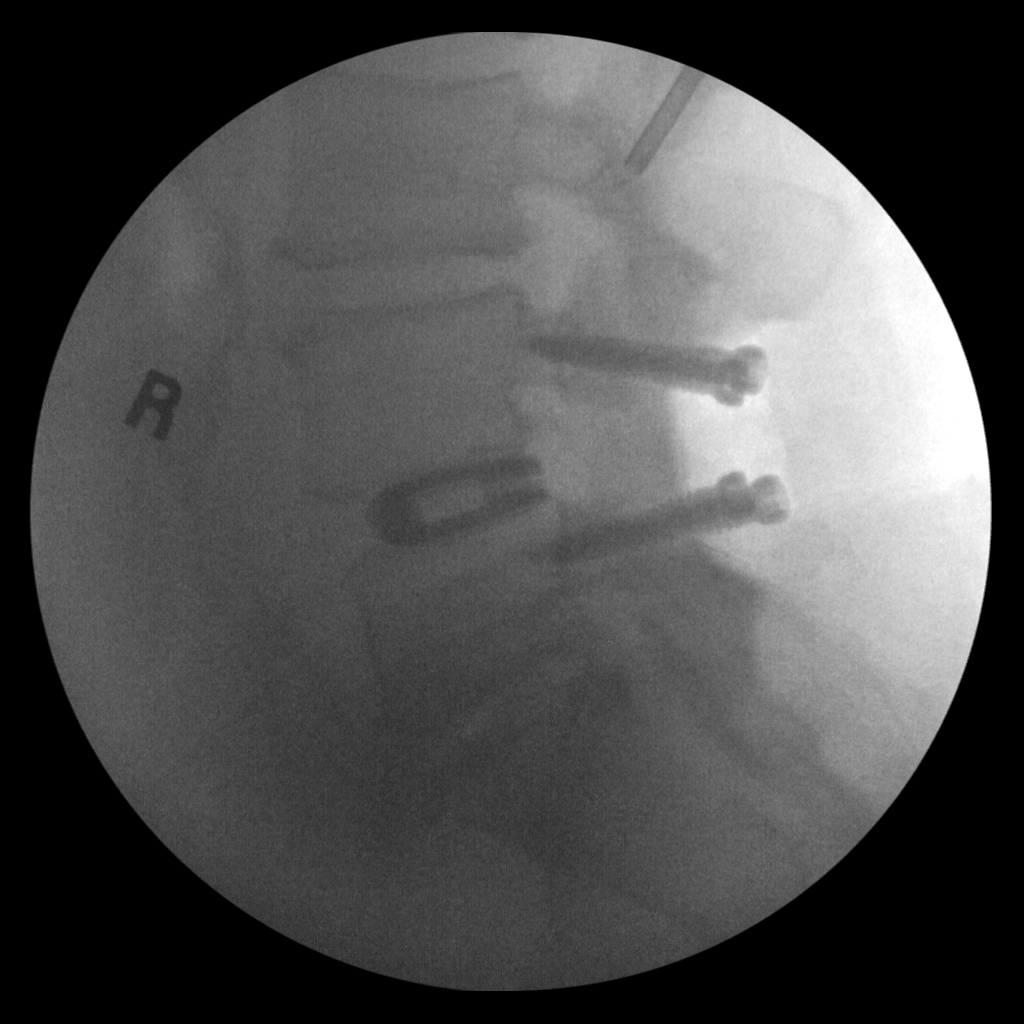
[im 2/2]
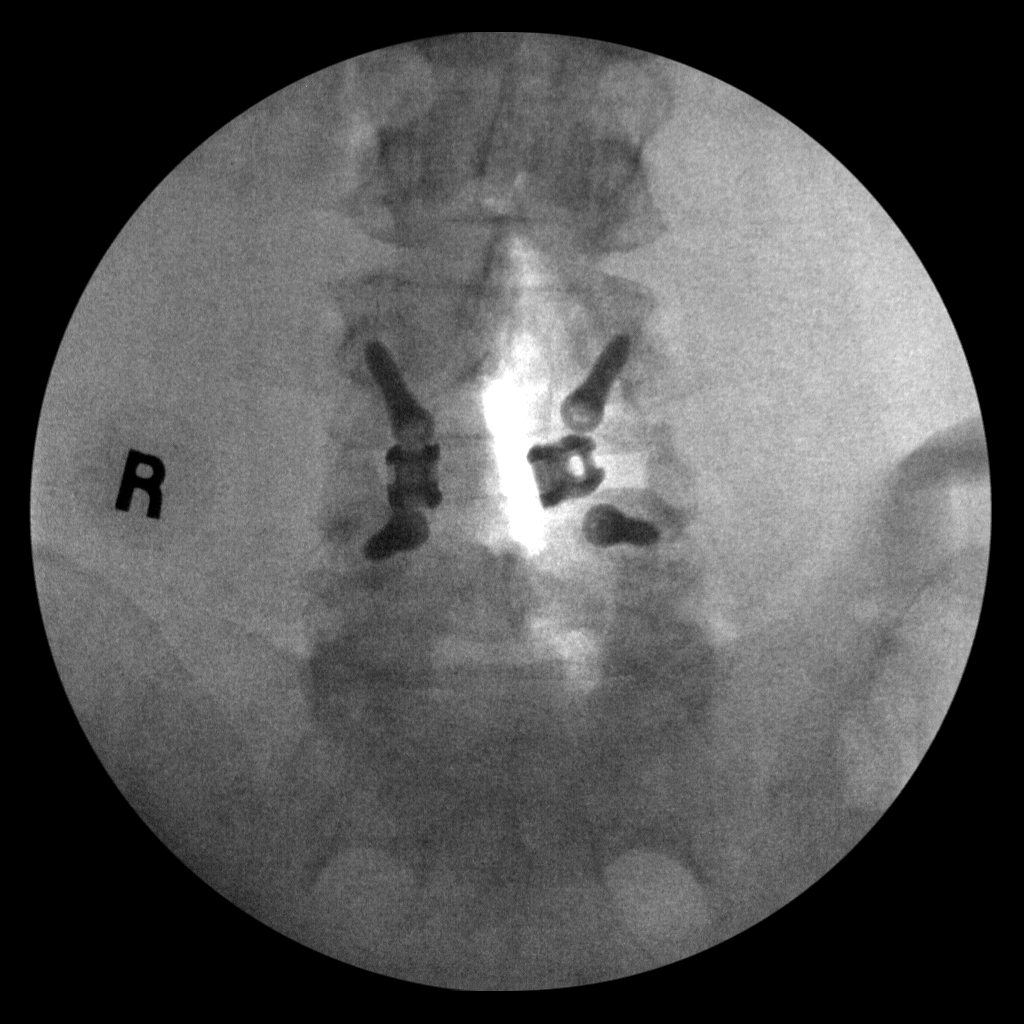

[2 of 2 positions shown; findings below may reference images not displayed]

FINDINGS: PA and lateral view intraoperative fluoroscopic images of the lumbar
spine are submitted, 2 images total. The images demonstrate
bilateral pedicle screws at L4-L5. Vertical interconnecting rods
were not present at the time the images were taken. An interbody
device is also present at L4-L5.
IMPRESSION: Two intraoperative fluoroscopic images of the lumbar spine, as
described.

## 2021-12-26 ENCOUNTER — Ambulatory Visit: Payer: PPO | Admitting: Podiatry

## 2022-01-09 ENCOUNTER — Other Ambulatory Visit: Payer: Self-pay

## 2022-01-09 ENCOUNTER — Ambulatory Visit (INDEPENDENT_AMBULATORY_CARE_PROVIDER_SITE_OTHER): Payer: PPO | Admitting: Podiatry

## 2022-01-09 DIAGNOSIS — B999 Unspecified infectious disease: Secondary | ICD-10-CM

## 2022-01-09 DIAGNOSIS — L309 Dermatitis, unspecified: Secondary | ICD-10-CM

## 2022-01-09 MED ORDER — METHYLPREDNISOLONE 4 MG PO TBPK: ORAL_TABLET | ORAL | 0 refills | Status: AC

## 2022-01-09 MED ORDER — DOXYCYCLINE HYCLATE 100 MG PO TABS: 100.0000 mg | ORAL_TABLET | Freq: Two times a day (BID) | ORAL | 0 refills | Status: DC

## 2022-01-09 MED ORDER — TERBINAFINE HCL 250 MG PO TABS: 250.0000 mg | ORAL_TABLET | Freq: Every day | ORAL | 0 refills | Status: DC

## 2022-01-14 NOTE — Progress Notes (Signed)
?Subjective:  ?Patient ID: Margaret [name], female    DOB: [birth year],  MRN: [id number] ? ?Chief Complaint  ?Patient presents with  ? Nail Problem  ?  Thick painful toenails, 3 month follow up   ? ? ?61 y.o. female presents with the above complaint.  Patient presents with complaint of bilateral forefoot itching and redness inflamed left greater than right side.  She states it came out of nowhere has progressive gotten worse.  She has not seen anyone else prior to seeing me.  She was seen by Dr. Logan Bores in the past for different etiology of her complaint.  She states that it has progressed gotten worse there is some itching associated with the pain scale is 5 out of 10 hurts with ambulation.  She has not tried anything for it.  Nothing has made it feel better. ? ? ?Review of Systems: Negative except as noted in the HPI. Denies N/V/F/Ch. ? ?Past Medical History:  ?Diagnosis Date  ? Anxiety   ? Arthritis   ? knees, lower back, hands  ? Depression   ? Diabetes mellitus without complication (HCC)   ? Hyperlipidemia   ? Hypertension   ? on meds, reviewed by Dr. Jacinto Halim for cardiac clearance - 09/2013, told that she was cleared & no need to follow up with him   ? Hypothyroidism   ? Mental disorder   ? perimenopause use of Paxil  ? SVD (spontaneous vaginal delivery)   ? x 3  ? Thyroid disease   ? Total knee replacement status   ? Wears dentures   ? upper and lower  ? ? ?Current Outpatient Medications:  ?  doxycycline (VIBRA-TABS) 100 MG tablet, Take 1 tablet (100 mg total) by mouth 2 (two) times daily., Disp: 28 tablet, Rfl: 0 ?  methylPREDNISolone (MEDROL DOSEPAK) 4 MG TBPK tablet, Take as directed, Disp: 21 each, Rfl: 0 ?  terbinafine (LAMISIL) 250 MG tablet, Take 1 tablet (250 mg total) by mouth daily., Disp: 30 tablet, Rfl: 0 ?  AgaMatrix Ultra-Thin Lancets MISC, Check blood sugar once daily. Dx E11.9 (OneTouch Verio), Disp: , Rfl:  ?  atorvastatin (LIPITOR) 20 MG tablet, Take 20 mg by mouth at bedtime., Disp: , Rfl:  ?   benzonatate (TESSALON PERLES) 100 MG capsule, Take 1 capsule (100 mg total) by mouth 3 (three) times daily as needed., Disp: 20 capsule, Rfl: 0 ?  Blood Glucose Monitoring Suppl (GLUCOCOM BLOOD GLUCOSE MONITOR) DEVI, Check blood sugar once daily. Dx E11.9, Disp: , Rfl:  ?  buPROPion (WELLBUTRIN XL) 150 MG 24 hr tablet, Take 150 mg by mouth daily at 2 PM. Take at 2pm, Disp: , Rfl:  ?  buPROPion (WELLBUTRIN XL) 150 MG 24 hr tablet, Take 1 tablet by mouth every morning., Disp: , Rfl:  ?  buPROPion (WELLBUTRIN XL) 300 MG 24 hr tablet, Take 300 mg by mouth daily with breakfast., Disp: , Rfl:  ?  calcitRIOL (ROCALTROL) 0.5 MCG capsule, Take 1 mcg by mouth daily., Disp: , Rfl:  ?  Cholecalciferol (VITAMIN D) 125 MCG (5000 UT) CAPS, Take 5,000 Units by mouth daily., Disp: , Rfl:  ?  diphenhydrAMINE (SOMINEX) 25 MG tablet, Take 25 mg by mouth at bedtime as needed for allergies. sleep, Disp: , Rfl:  ?  DULoxetine (CYMBALTA) 60 MG capsule, Take 60 mg by mouth at bedtime., Disp: , Rfl:  ?  DULoxetine (CYMBALTA) 60 MG capsule, Take 1 tablet by mouth daily., Disp: , Rfl:  ?  estradiol (ESTRACE)  1 MG tablet, Take 1 mg by mouth daily. , Disp: , Rfl:  ?  fenofibrate 54 MG tablet, Take 108 mg by mouth at bedtime. , Disp: , Rfl:  ?  fluticasone (FLONASE) 50 MCG/ACT nasal spray, Place 2 sprays into both nostrils daily., Disp: 16 g, Rfl: 6 ?  furosemide (LASIX) 20 MG tablet, Take 20 mg by mouth 2 (two) times daily with breakfast and lunch., Disp: , Rfl:  ?  glucose blood (ONETOUCH VERIO) test strip, Check blood sugar once daily. Dx E11.9, Disp: , Rfl:  ?  HYDROcodone-acetaminophen (NORCO/VICODIN) 5-325 MG tablet, Take 1 tablet by mouth 2 (two) times daily as needed for up to 5 days for moderate pain or severe pain. (Patient taking differently: Take 1 tablet by mouth every 6 (six) hours. ), Disp: 30 tablet, Rfl: 0 ?  ibuprofen (ADVIL,MOTRIN) 200 MG tablet, Take 600 mg by mouth every 6 (six) hours as needed for moderate pain., Disp: ,  Rfl:  ?  levothyroxine (SYNTHROID) 25 MCG tablet, Take 25 mcg by mouth every Monday, Wednesday, and Friday. Takes with 200mcg on MWF for a total of 225mcg, Disp: , Rfl:  ?  levothyroxine (SYNTHROID, LEVOTHROID) 200 MCG tablet, Take 200 mcg by mouth daily before breakfast. , Disp: , Rfl:  ?  lisinopril (ZESTRIL) 20 MG tablet, Take 20 mg by mouth daily., Disp: , Rfl:  ?  lisinopril (ZESTRIL) 20 MG tablet, Take 1 tablet by mouth daily., Disp: , Rfl:  ?  meloxicam (MOBIC) 15 MG tablet, Take 15 mg by mouth daily., Disp: , Rfl:  ?  meloxicam (MOBIC) 15 MG tablet, Take 1 tablet by mouth daily., Disp: , Rfl:  ?  Menthol, Topical Analgesic, (ICY HOT ADVANCED RELIEF EX), Apply 1 application topically 2 (two) times daily as needed (pain)., Disp: , Rfl:  ?  metFORMIN (GLUCOPHAGE-XR) 500 MG 24 hr tablet, Take 500 mg by mouth daily with breakfast., Disp: , Rfl:  ?  methocarbamol (ROBAXIN) 500 MG tablet, Take 1,000 mg by mouth 4 (four) times daily as needed for muscle spasms., Disp: , Rfl:  ?  methocarbamol (ROBAXIN) 500 MG tablet, Take 1 tablet (500 mg total) by mouth 4 (four) times daily., Disp: 45 tablet, Rfl: 0 ?  methocarbamol (ROBAXIN) 500 MG tablet, Take 1 tablet by mouth 4 (four) times daily., Disp: , Rfl:  ?  metoprolol succinate (TOPROL-XL) 50 MG 24 hr tablet, Take 50 mg by mouth daily. , Disp: , Rfl:  ?  oxybutynin (DITROPAN-XL) 10 MG 24 hr tablet, Take 10 mg by mouth at bedtime., Disp: , Rfl:  ?  oxybutynin (DITROPAN-XL) 10 MG 24 hr tablet, Take by mouth., Disp: , Rfl:  ?  pregabalin (LYRICA) 75 MG capsule, TAKE 1 CAPSULE BY MOUTH TWICE A DAY (Patient taking differently: Take 75 mg by mouth 2 (two) times daily. ), Disp: 60 capsule, Rfl: 2 ?  pregabalin (LYRICA) 75 MG capsule, Take 1 capsule by mouth 2 (two) times daily., Disp: , Rfl:  ?  progesterone (PROMETRIUM) 100 MG capsule, Take 100 mg by mouth at bedtime., Disp: , Rfl:  ?  sitaGLIPtin (JANUVIA) 100 MG tablet, , Disp: , Rfl:  ?  TRULICITY 0.75 MG/0.5ML SOPN,  SMARTSIG:0.5 Milliliter(s) SUB-Q Once a Week, Disp: , Rfl:  ?  Turmeric 500 MG TABS, Take 1,000 mg by mouth daily., Disp: , Rfl:  ? ?Social History  ? ?Tobacco Use  ?Smoking Status Former  ? Packs/day: 1.00  ? Years: 25.00  ? Pack years: 25.00  ?  Types: Cigarettes  ? Quit date: 07/13/2008  ? Years since quitting: 13.5  ?Smokeless Tobacco Never  ? ? ?Allergies  ?Allergen Reactions  ? Codeine Nausea Only and Nausea And Vomiting  ? ?Objective:  ?There were no vitals filed for this visit. ?There is no height or weight on file to calculate BMI. ?Constitutional Well developed. ?Well nourished.  ?Vascular Dorsalis pedis pulses palpable bilaterally. ?Posterior tibial pulses palpable bilaterally. ?Capillary refill normal to all digits.  ?No cyanosis or clubbing noted. ?Pedal hair growth normal.  ?Neurologic Normal speech. ?Oriented to person, place, and time. ?Epicritic sensation to light touch grossly present bilaterally.  ?Dermatologic Redness with patchiness with epidermal lysis superficial noted to bilateral foot and left greater than right side.  Subjective component of itching noted.  ?Orthopedic: Normal joint ROM without pain or crepitus bilaterally. ?No visible deformities. ?No bony tenderness.  ? ?Radiographs: None ?Assessment:  ? ?1. Superinfection   ?2. Dermatitis   ? ?Plan:  ?Patient was evaluated and treated and all questions answered. ? ?Bilateral superinfection with bacterial/fungal involvement ?-All questions and concerns were discussed with the patient in extensive detail.  Given the redness is present she may have a superinfection thus present.  I believe she will benefit from Lamisil for 30 days.  She denies any liver issues.  She will also benefit from doxycycline for antibacterial component.  I will also place her on a 5-day taper course of steroid to help with the redness.  I discussed all of this with the patient she states understanding. ? ?Return in about 4 weeks (around 02/06/2022), or  cellulitis,fungal infection. ?

## 2022-01-19 ENCOUNTER — Telehealth: Payer: Self-pay | Admitting: Podiatry

## 2022-01-19 MED ORDER — CEPHALEXIN 500 MG PO CAPS: 500.0000 mg | ORAL_CAPSULE | Freq: Three times a day (TID) | ORAL | 0 refills | Status: AC

## 2022-01-19 MED ORDER — FLUCONAZOLE 150 MG PO TABS: 150.0000 mg | ORAL_TABLET | ORAL | 0 refills | Status: AC

## 2022-01-19 NOTE — Telephone Encounter (Signed)
Patient called and stated that she had an infection in her right foot and now her left foot has worsened. She also thinks the infection has spread to her mouth and throat and wants to know what she can do. She is still taking the Antibiotic and anti-fungal medication. ?

## 2022-01-19 NOTE — Telephone Encounter (Signed)
Called and spoke with patient and she started to have a bad taste in mouth, blisters on tongue sores in back of throat on the 24 th after starting medications prescribed by Dr Al Corpus is having difficulty swallowing but no problems with breathing, has taken another dosage of the doxy, terbinafine today. Explained that she should not take any more medicine until she hears back from the physician and that if it becomes worse before she hears back to go to Urgent Care or ER.She verbalized understanding.

## 2022-01-19 NOTE — Telephone Encounter (Signed)
Called and notified patient of new prescriptions sent to pharmacy and to stop the other prescriptions prescribed earlier.she verbalized understanding.

## 2022-01-19 NOTE — Addendum Note (Signed)
Addended byLilian Kapur, Deontez Klinke R on: 01/19/2022 02:04 PM ? ? Modules accepted: Orders ? ?

## 2022-01-19 NOTE — Telephone Encounter (Signed)
Ammie can you see what she means by spread to her mouth and throat? If she is having any swelling, difficulty swallowing or breathing she may be having an allergic reaction

## 2022-02-11 ENCOUNTER — Ambulatory Visit: Payer: PPO | Admitting: Podiatry

## 2022-02-20 ENCOUNTER — Ambulatory Visit: Payer: PPO | Admitting: Podiatry

## 2022-02-24 ENCOUNTER — Telehealth: Payer: Self-pay | Admitting: Podiatry

## 2022-02-24 NOTE — Telephone Encounter (Signed)
Pts son called to let you know pt passed away on Sunday Feb 26, 2022. We have canceled her appts. ?

## 2022-03-19 DEATH — deceased

## 2022-03-20 ENCOUNTER — Ambulatory Visit: Payer: PPO | Admitting: Podiatry
